# Patient Record
Sex: Female | Born: 1935 | Race: White | Hispanic: No | Smoking: Never smoker
Health system: Southern US, Community
[De-identification: ages and names within clinical notes are randomized; demographics above are authoritative.]

## PROBLEM LIST (undated history)

## (undated) DIAGNOSIS — J45909 Unspecified asthma, uncomplicated: Secondary | ICD-10-CM

## (undated) DIAGNOSIS — H269 Unspecified cataract: Secondary | ICD-10-CM

## (undated) DIAGNOSIS — R002 Palpitations: Secondary | ICD-10-CM

## (undated) DIAGNOSIS — I10 Essential (primary) hypertension: Secondary | ICD-10-CM

## (undated) DIAGNOSIS — E785 Hyperlipidemia, unspecified: Secondary | ICD-10-CM

## (undated) DIAGNOSIS — F419 Anxiety disorder, unspecified: Secondary | ICD-10-CM

## (undated) DIAGNOSIS — M1990 Unspecified osteoarthritis, unspecified site: Secondary | ICD-10-CM

## (undated) DIAGNOSIS — K219 Gastro-esophageal reflux disease without esophagitis: Secondary | ICD-10-CM

## (undated) DIAGNOSIS — T7840XA Allergy, unspecified, initial encounter: Secondary | ICD-10-CM

## (undated) DIAGNOSIS — C4491 Basal cell carcinoma of skin, unspecified: Secondary | ICD-10-CM

## (undated) DIAGNOSIS — E079 Disorder of thyroid, unspecified: Secondary | ICD-10-CM

## (undated) DIAGNOSIS — G43909 Migraine, unspecified, not intractable, without status migrainosus: Secondary | ICD-10-CM

## (undated) HISTORY — PX: CATARACT EXTRACTION: SUR2

## (undated) HISTORY — PX: PARTIAL KNEE ARTHROPLASTY: SHX2174

## (undated) HISTORY — DX: Allergy, unspecified, initial encounter: T78.40XA

## (undated) HISTORY — PX: MENISCUS REPAIR: SHX5179

## (undated) HISTORY — PX: COLONOSCOPY: SHX174

## (undated) HISTORY — DX: Essential (primary) hypertension: I10

## (undated) HISTORY — DX: Basal cell carcinoma of skin, unspecified: C44.91

## (undated) HISTORY — DX: Unspecified osteoarthritis, unspecified site: M19.90

## (undated) HISTORY — PX: APPENDECTOMY: SHX54

## (undated) HISTORY — DX: Unspecified cataract: H26.9

## (undated) HISTORY — PX: ABDOMINAL HYSTERECTOMY: SHX81

## (undated) HISTORY — DX: Migraine, unspecified, not intractable, without status migrainosus: G43.909

## (undated) HISTORY — DX: Unspecified asthma, uncomplicated: J45.909

## (undated) HISTORY — PX: TONSILLECTOMY: SUR1361

## (undated) HISTORY — DX: Anxiety disorder, unspecified: F41.9

## (undated) HISTORY — DX: Disorder of thyroid, unspecified: E07.9

## (undated) HISTORY — DX: Palpitations: R00.2

## (undated) HISTORY — DX: Gastro-esophageal reflux disease without esophagitis: K21.9

## (undated) HISTORY — DX: Hyperlipidemia, unspecified: E78.5

---

## 1999-06-27 ENCOUNTER — Other Ambulatory Visit: Admission: RE | Admit: 1999-06-27 | Discharge: 1999-06-27 | Payer: Self-pay | Admitting: Family Medicine

## 2002-12-08 ENCOUNTER — Other Ambulatory Visit: Admission: RE | Admit: 2002-12-08 | Discharge: 2002-12-08 | Payer: Self-pay | Admitting: Family Medicine

## 2005-06-19 ENCOUNTER — Other Ambulatory Visit: Admission: RE | Admit: 2005-06-19 | Discharge: 2005-06-19 | Payer: Self-pay | Admitting: Family Medicine

## 2009-06-07 ENCOUNTER — Emergency Department (HOSPITAL_COMMUNITY): Admission: EM | Admit: 2009-06-07 | Discharge: 2009-06-07 | Payer: Self-pay | Admitting: Emergency Medicine

## 2011-04-09 LAB — POCT I-STAT, CHEM 8
BUN: 13 mg/dL (ref 6–23)
Creatinine, Ser: 0.8 mg/dL (ref 0.4–1.2)
Glucose, Bld: 118 mg/dL — ABNORMAL HIGH (ref 70–99)
Hemoglobin: 13.9 g/dL (ref 12.0–15.0)
Sodium: 139 mEq/L (ref 135–145)
TCO2: 22 mmol/L (ref 0–100)

## 2011-04-09 LAB — POCT CARDIAC MARKERS
CKMB, poc: <1 ng/mL — ABNORMAL LOW (ref 1.0–8.0)
Myoglobin, poc: 108 ng/mL (ref 12–200)

## 2012-01-21 DIAGNOSIS — E785 Hyperlipidemia, unspecified: Secondary | ICD-10-CM | POA: Diagnosis not present

## 2012-01-21 DIAGNOSIS — I10 Essential (primary) hypertension: Secondary | ICD-10-CM | POA: Diagnosis not present

## 2012-01-21 DIAGNOSIS — E039 Hypothyroidism, unspecified: Secondary | ICD-10-CM | POA: Diagnosis not present

## 2012-01-23 DIAGNOSIS — E875 Hyperkalemia: Secondary | ICD-10-CM | POA: Diagnosis not present

## 2012-01-23 DIAGNOSIS — R7989 Other specified abnormal findings of blood chemistry: Secondary | ICD-10-CM | POA: Diagnosis not present

## 2012-02-26 DIAGNOSIS — J019 Acute sinusitis, unspecified: Secondary | ICD-10-CM | POA: Diagnosis not present

## 2012-02-26 DIAGNOSIS — Z1231 Encounter for screening mammogram for malignant neoplasm of breast: Secondary | ICD-10-CM | POA: Diagnosis not present

## 2012-02-28 DIAGNOSIS — M1710 Unilateral primary osteoarthritis, unspecified knee: Secondary | ICD-10-CM | POA: Diagnosis not present

## 2012-04-03 DIAGNOSIS — J209 Acute bronchitis, unspecified: Secondary | ICD-10-CM | POA: Diagnosis not present

## 2012-04-17 DIAGNOSIS — D235 Other benign neoplasm of skin of trunk: Secondary | ICD-10-CM | POA: Diagnosis not present

## 2012-04-17 DIAGNOSIS — Z85828 Personal history of other malignant neoplasm of skin: Secondary | ICD-10-CM | POA: Diagnosis not present

## 2012-04-17 DIAGNOSIS — L820 Inflamed seborrheic keratosis: Secondary | ICD-10-CM | POA: Diagnosis not present

## 2012-04-17 DIAGNOSIS — L570 Actinic keratosis: Secondary | ICD-10-CM | POA: Diagnosis not present

## 2012-05-05 DIAGNOSIS — M1710 Unilateral primary osteoarthritis, unspecified knee: Secondary | ICD-10-CM | POA: Diagnosis not present

## 2012-05-05 DIAGNOSIS — IMO0002 Reserved for concepts with insufficient information to code with codable children: Secondary | ICD-10-CM | POA: Diagnosis not present

## 2012-05-05 DIAGNOSIS — M169 Osteoarthritis of hip, unspecified: Secondary | ICD-10-CM | POA: Diagnosis not present

## 2012-05-05 DIAGNOSIS — M7120 Synovial cyst of popliteal space [Baker], unspecified knee: Secondary | ICD-10-CM | POA: Diagnosis not present

## 2012-05-21 DIAGNOSIS — K219 Gastro-esophageal reflux disease without esophagitis: Secondary | ICD-10-CM | POA: Diagnosis not present

## 2012-05-21 DIAGNOSIS — G8918 Other acute postprocedural pain: Secondary | ICD-10-CM | POA: Diagnosis not present

## 2012-05-21 DIAGNOSIS — M1710 Unilateral primary osteoarthritis, unspecified knee: Secondary | ICD-10-CM | POA: Diagnosis not present

## 2012-05-22 DIAGNOSIS — K219 Gastro-esophageal reflux disease without esophagitis: Secondary | ICD-10-CM | POA: Diagnosis not present

## 2012-05-22 DIAGNOSIS — M1710 Unilateral primary osteoarthritis, unspecified knee: Secondary | ICD-10-CM | POA: Diagnosis not present

## 2012-05-22 DIAGNOSIS — G8918 Other acute postprocedural pain: Secondary | ICD-10-CM | POA: Diagnosis not present

## 2012-05-23 DIAGNOSIS — Z96659 Presence of unspecified artificial knee joint: Secondary | ICD-10-CM | POA: Diagnosis not present

## 2012-05-23 DIAGNOSIS — M1710 Unilateral primary osteoarthritis, unspecified knee: Secondary | ICD-10-CM | POA: Diagnosis not present

## 2012-05-23 DIAGNOSIS — M25569 Pain in unspecified knee: Secondary | ICD-10-CM | POA: Diagnosis not present

## 2012-05-23 DIAGNOSIS — IMO0001 Reserved for inherently not codable concepts without codable children: Secondary | ICD-10-CM | POA: Diagnosis not present

## 2012-05-28 ENCOUNTER — Ambulatory Visit: Payer: Self-pay | Admitting: Physical Therapy

## 2012-05-28 DIAGNOSIS — M1710 Unilateral primary osteoarthritis, unspecified knee: Secondary | ICD-10-CM | POA: Diagnosis not present

## 2012-05-28 DIAGNOSIS — Z96659 Presence of unspecified artificial knee joint: Secondary | ICD-10-CM | POA: Diagnosis not present

## 2012-05-28 DIAGNOSIS — IMO0001 Reserved for inherently not codable concepts without codable children: Secondary | ICD-10-CM | POA: Diagnosis not present

## 2012-05-28 DIAGNOSIS — M25569 Pain in unspecified knee: Secondary | ICD-10-CM | POA: Diagnosis not present

## 2012-05-30 DIAGNOSIS — Z96659 Presence of unspecified artificial knee joint: Secondary | ICD-10-CM | POA: Diagnosis not present

## 2012-05-30 DIAGNOSIS — M1710 Unilateral primary osteoarthritis, unspecified knee: Secondary | ICD-10-CM | POA: Diagnosis not present

## 2012-05-30 DIAGNOSIS — M25569 Pain in unspecified knee: Secondary | ICD-10-CM | POA: Diagnosis not present

## 2012-05-30 DIAGNOSIS — IMO0001 Reserved for inherently not codable concepts without codable children: Secondary | ICD-10-CM | POA: Diagnosis not present

## 2012-06-02 DIAGNOSIS — Z96659 Presence of unspecified artificial knee joint: Secondary | ICD-10-CM | POA: Diagnosis not present

## 2012-06-02 DIAGNOSIS — M1710 Unilateral primary osteoarthritis, unspecified knee: Secondary | ICD-10-CM | POA: Diagnosis not present

## 2012-06-02 DIAGNOSIS — IMO0001 Reserved for inherently not codable concepts without codable children: Secondary | ICD-10-CM | POA: Diagnosis not present

## 2012-06-02 DIAGNOSIS — M25569 Pain in unspecified knee: Secondary | ICD-10-CM | POA: Diagnosis not present

## 2012-06-05 DIAGNOSIS — Z96659 Presence of unspecified artificial knee joint: Secondary | ICD-10-CM | POA: Diagnosis not present

## 2012-06-05 DIAGNOSIS — M1710 Unilateral primary osteoarthritis, unspecified knee: Secondary | ICD-10-CM | POA: Diagnosis not present

## 2012-06-05 DIAGNOSIS — M25569 Pain in unspecified knee: Secondary | ICD-10-CM | POA: Diagnosis not present

## 2012-06-05 DIAGNOSIS — IMO0001 Reserved for inherently not codable concepts without codable children: Secondary | ICD-10-CM | POA: Diagnosis not present

## 2012-06-09 DIAGNOSIS — M25569 Pain in unspecified knee: Secondary | ICD-10-CM | POA: Diagnosis not present

## 2012-06-09 DIAGNOSIS — Z96659 Presence of unspecified artificial knee joint: Secondary | ICD-10-CM | POA: Diagnosis not present

## 2012-06-09 DIAGNOSIS — M1710 Unilateral primary osteoarthritis, unspecified knee: Secondary | ICD-10-CM | POA: Diagnosis not present

## 2012-06-09 DIAGNOSIS — IMO0001 Reserved for inherently not codable concepts without codable children: Secondary | ICD-10-CM | POA: Diagnosis not present

## 2012-06-12 DIAGNOSIS — M1710 Unilateral primary osteoarthritis, unspecified knee: Secondary | ICD-10-CM | POA: Diagnosis not present

## 2012-06-12 DIAGNOSIS — IMO0001 Reserved for inherently not codable concepts without codable children: Secondary | ICD-10-CM | POA: Diagnosis not present

## 2012-06-12 DIAGNOSIS — Z96659 Presence of unspecified artificial knee joint: Secondary | ICD-10-CM | POA: Diagnosis not present

## 2012-06-12 DIAGNOSIS — M25569 Pain in unspecified knee: Secondary | ICD-10-CM | POA: Diagnosis not present

## 2012-06-16 DIAGNOSIS — H526 Other disorders of refraction: Secondary | ICD-10-CM | POA: Diagnosis not present

## 2012-06-16 DIAGNOSIS — H02839 Dermatochalasis of unspecified eye, unspecified eyelid: Secondary | ICD-10-CM | POA: Diagnosis not present

## 2012-06-16 DIAGNOSIS — H3530 Unspecified macular degeneration: Secondary | ICD-10-CM | POA: Diagnosis not present

## 2012-06-16 DIAGNOSIS — H26499 Other secondary cataract, unspecified eye: Secondary | ICD-10-CM | POA: Diagnosis not present

## 2012-06-17 DIAGNOSIS — Z96659 Presence of unspecified artificial knee joint: Secondary | ICD-10-CM | POA: Diagnosis not present

## 2012-06-17 DIAGNOSIS — M1710 Unilateral primary osteoarthritis, unspecified knee: Secondary | ICD-10-CM | POA: Diagnosis not present

## 2012-06-17 DIAGNOSIS — IMO0001 Reserved for inherently not codable concepts without codable children: Secondary | ICD-10-CM | POA: Diagnosis not present

## 2012-06-17 DIAGNOSIS — M25569 Pain in unspecified knee: Secondary | ICD-10-CM | POA: Diagnosis not present

## 2012-06-19 DIAGNOSIS — M25569 Pain in unspecified knee: Secondary | ICD-10-CM | POA: Diagnosis not present

## 2012-06-19 DIAGNOSIS — IMO0001 Reserved for inherently not codable concepts without codable children: Secondary | ICD-10-CM | POA: Diagnosis not present

## 2012-06-19 DIAGNOSIS — Z96659 Presence of unspecified artificial knee joint: Secondary | ICD-10-CM | POA: Diagnosis not present

## 2012-06-19 DIAGNOSIS — M1710 Unilateral primary osteoarthritis, unspecified knee: Secondary | ICD-10-CM | POA: Diagnosis not present

## 2012-06-26 DIAGNOSIS — Z7982 Long term (current) use of aspirin: Secondary | ICD-10-CM | POA: Diagnosis not present

## 2012-06-26 DIAGNOSIS — E7800 Pure hypercholesterolemia, unspecified: Secondary | ICD-10-CM | POA: Diagnosis not present

## 2012-06-26 DIAGNOSIS — R51 Headache: Secondary | ICD-10-CM | POA: Diagnosis not present

## 2012-06-26 DIAGNOSIS — R1110 Vomiting, unspecified: Secondary | ICD-10-CM | POA: Diagnosis not present

## 2012-06-26 DIAGNOSIS — G43909 Migraine, unspecified, not intractable, without status migrainosus: Secondary | ICD-10-CM | POA: Diagnosis not present

## 2012-06-26 DIAGNOSIS — R6889 Other general symptoms and signs: Secondary | ICD-10-CM | POA: Diagnosis not present

## 2012-06-26 DIAGNOSIS — E869 Volume depletion, unspecified: Secondary | ICD-10-CM | POA: Diagnosis not present

## 2012-06-26 DIAGNOSIS — Z79899 Other long term (current) drug therapy: Secondary | ICD-10-CM | POA: Diagnosis not present

## 2012-06-26 DIAGNOSIS — R112 Nausea with vomiting, unspecified: Secondary | ICD-10-CM | POA: Diagnosis not present

## 2012-07-07 DIAGNOSIS — Z96659 Presence of unspecified artificial knee joint: Secondary | ICD-10-CM | POA: Diagnosis not present

## 2012-07-10 DIAGNOSIS — R5381 Other malaise: Secondary | ICD-10-CM | POA: Diagnosis not present

## 2012-07-10 DIAGNOSIS — N390 Urinary tract infection, site not specified: Secondary | ICD-10-CM | POA: Diagnosis not present

## 2012-07-10 DIAGNOSIS — Z79899 Other long term (current) drug therapy: Secondary | ICD-10-CM | POA: Diagnosis not present

## 2012-07-10 DIAGNOSIS — E785 Hyperlipidemia, unspecified: Secondary | ICD-10-CM | POA: Diagnosis not present

## 2012-07-10 DIAGNOSIS — J40 Bronchitis, not specified as acute or chronic: Secondary | ICD-10-CM | POA: Diagnosis not present

## 2012-07-10 DIAGNOSIS — D510 Vitamin B12 deficiency anemia due to intrinsic factor deficiency: Secondary | ICD-10-CM | POA: Diagnosis not present

## 2012-07-10 DIAGNOSIS — E559 Vitamin D deficiency, unspecified: Secondary | ICD-10-CM | POA: Diagnosis not present

## 2012-08-13 DIAGNOSIS — M79609 Pain in unspecified limb: Secondary | ICD-10-CM | POA: Diagnosis not present

## 2012-08-13 DIAGNOSIS — M722 Plantar fascial fibromatosis: Secondary | ICD-10-CM | POA: Diagnosis not present

## 2012-08-18 DIAGNOSIS — M722 Plantar fascial fibromatosis: Secondary | ICD-10-CM | POA: Diagnosis not present

## 2012-08-18 DIAGNOSIS — M79609 Pain in unspecified limb: Secondary | ICD-10-CM | POA: Diagnosis not present

## 2012-09-08 DIAGNOSIS — M79609 Pain in unspecified limb: Secondary | ICD-10-CM | POA: Diagnosis not present

## 2012-09-08 DIAGNOSIS — M722 Plantar fascial fibromatosis: Secondary | ICD-10-CM | POA: Diagnosis not present

## 2012-09-12 DIAGNOSIS — J069 Acute upper respiratory infection, unspecified: Secondary | ICD-10-CM | POA: Diagnosis not present

## 2012-09-29 DIAGNOSIS — M79609 Pain in unspecified limb: Secondary | ICD-10-CM | POA: Diagnosis not present

## 2012-09-29 DIAGNOSIS — M722 Plantar fascial fibromatosis: Secondary | ICD-10-CM | POA: Diagnosis not present

## 2012-10-20 DIAGNOSIS — M79609 Pain in unspecified limb: Secondary | ICD-10-CM | POA: Diagnosis not present

## 2012-10-20 DIAGNOSIS — M722 Plantar fascial fibromatosis: Secondary | ICD-10-CM | POA: Diagnosis not present

## 2012-10-21 DIAGNOSIS — I10 Essential (primary) hypertension: Secondary | ICD-10-CM | POA: Diagnosis not present

## 2012-10-21 DIAGNOSIS — E785 Hyperlipidemia, unspecified: Secondary | ICD-10-CM | POA: Diagnosis not present

## 2012-10-21 DIAGNOSIS — Z23 Encounter for immunization: Secondary | ICD-10-CM | POA: Diagnosis not present

## 2012-10-23 DIAGNOSIS — IMO0002 Reserved for concepts with insufficient information to code with codable children: Secondary | ICD-10-CM | POA: Diagnosis not present

## 2012-10-23 DIAGNOSIS — M25569 Pain in unspecified knee: Secondary | ICD-10-CM | POA: Diagnosis not present

## 2012-10-23 DIAGNOSIS — M899 Disorder of bone, unspecified: Secondary | ICD-10-CM | POA: Diagnosis not present

## 2012-10-23 DIAGNOSIS — Z9660 Presence of unspecified orthopedic joint implant: Secondary | ICD-10-CM | POA: Diagnosis not present

## 2012-12-19 DIAGNOSIS — J40 Bronchitis, not specified as acute or chronic: Secondary | ICD-10-CM | POA: Diagnosis not present

## 2012-12-19 DIAGNOSIS — H65199 Other acute nonsuppurative otitis media, unspecified ear: Secondary | ICD-10-CM | POA: Diagnosis not present

## 2012-12-19 DIAGNOSIS — R070 Pain in throat: Secondary | ICD-10-CM | POA: Diagnosis not present

## 2013-01-14 DIAGNOSIS — Z111 Encounter for screening for respiratory tuberculosis: Secondary | ICD-10-CM | POA: Diagnosis not present

## 2013-03-19 DIAGNOSIS — C44320 Squamous cell carcinoma of skin of unspecified parts of face: Secondary | ICD-10-CM | POA: Diagnosis not present

## 2013-03-19 DIAGNOSIS — L988 Other specified disorders of the skin and subcutaneous tissue: Secondary | ICD-10-CM | POA: Diagnosis not present

## 2013-03-19 DIAGNOSIS — L570 Actinic keratosis: Secondary | ICD-10-CM | POA: Diagnosis not present

## 2013-03-19 DIAGNOSIS — L820 Inflamed seborrheic keratosis: Secondary | ICD-10-CM | POA: Diagnosis not present

## 2013-03-25 DIAGNOSIS — Z1231 Encounter for screening mammogram for malignant neoplasm of breast: Secondary | ICD-10-CM | POA: Diagnosis not present

## 2013-03-31 ENCOUNTER — Encounter: Payer: Self-pay | Admitting: Nurse Practitioner

## 2013-04-23 DIAGNOSIS — M25569 Pain in unspecified knee: Secondary | ICD-10-CM | POA: Diagnosis not present

## 2013-04-23 DIAGNOSIS — Z471 Aftercare following joint replacement surgery: Secondary | ICD-10-CM | POA: Diagnosis not present

## 2013-04-23 DIAGNOSIS — Z96659 Presence of unspecified artificial knee joint: Secondary | ICD-10-CM | POA: Diagnosis not present

## 2013-04-23 DIAGNOSIS — K219 Gastro-esophageal reflux disease without esophagitis: Secondary | ICD-10-CM | POA: Diagnosis not present

## 2013-04-23 DIAGNOSIS — E785 Hyperlipidemia, unspecified: Secondary | ICD-10-CM | POA: Diagnosis not present

## 2013-04-30 DIAGNOSIS — Z85828 Personal history of other malignant neoplasm of skin: Secondary | ICD-10-CM | POA: Diagnosis not present

## 2013-04-30 DIAGNOSIS — L820 Inflamed seborrheic keratosis: Secondary | ICD-10-CM | POA: Diagnosis not present

## 2013-05-04 ENCOUNTER — Other Ambulatory Visit: Payer: Self-pay

## 2013-05-04 MED ORDER — ATORVASTATIN CALCIUM 20 MG PO TABS: 20.0000 mg | ORAL_TABLET | Freq: Every day | ORAL | Status: DC

## 2013-05-04 NOTE — Telephone Encounter (Signed)
Last lipids 10/13  Last seen 12/13

## 2013-05-07 ENCOUNTER — Other Ambulatory Visit: Payer: Self-pay

## 2013-05-07 MED ORDER — ATORVASTATIN CALCIUM 20 MG PO TABS: ORAL_TABLET | ORAL | Status: DC

## 2013-05-21 ENCOUNTER — Other Ambulatory Visit: Payer: Self-pay | Admitting: Family Medicine

## 2013-05-26 DIAGNOSIS — M79609 Pain in unspecified limb: Secondary | ICD-10-CM | POA: Diagnosis not present

## 2013-05-26 DIAGNOSIS — M722 Plantar fascial fibromatosis: Secondary | ICD-10-CM | POA: Diagnosis not present

## 2013-06-08 ENCOUNTER — Ambulatory Visit (INDEPENDENT_AMBULATORY_CARE_PROVIDER_SITE_OTHER): Payer: Medicare Other | Admitting: Nurse Practitioner

## 2013-06-08 ENCOUNTER — Encounter: Payer: Self-pay | Admitting: Nurse Practitioner

## 2013-06-08 ENCOUNTER — Other Ambulatory Visit: Payer: Medicare Other

## 2013-06-08 VITALS — BP 118/65 | HR 90 | Temp 97.3°F | Ht 75.0 in | Wt 152.0 lb

## 2013-06-08 DIAGNOSIS — E785 Hyperlipidemia, unspecified: Secondary | ICD-10-CM

## 2013-06-08 LAB — COMPLETE METABOLIC PANEL WITH GFR
AST: 17 U/L (ref 0–37)
Albumin: 3.8 g/dL (ref 3.5–5.2)
Alkaline Phosphatase: 60 U/L (ref 39–117)
Calcium: 9.6 mg/dL (ref 8.4–10.5)
Chloride: 108 mEq/L (ref 96–112)
Glucose, Bld: 89 mg/dL (ref 70–99)
Potassium: 5.4 mEq/L — ABNORMAL HIGH (ref 3.5–5.3)
Sodium: 142 mEq/L (ref 135–145)
Total Protein: 6.5 g/dL (ref 6.0–8.3)

## 2013-06-08 MED ORDER — ATORVASTATIN CALCIUM 20 MG PO TABS: 20.0000 mg | ORAL_TABLET | Freq: Every day | ORAL | Status: DC

## 2013-06-08 NOTE — Progress Notes (Signed)
  Subjective:    Patient ID: Megan Chapman, female    DOB: 10-May-1931, 79 y.o.   MRN: [id number]  Hyperlipidemia This is a chronic problem. The current episode started more than 1 year ago. The problem is controlled. Recent lipid tests were reviewed and are normal. There are no known factors aggravating her hyperlipidemia. Pertinent negatives include no focal sensory loss, focal weakness, leg pain, myalgias or shortness of breath. Current antihyperlipidemic treatment includes statins. The current treatment provides significant improvement of lipids. There are no compliance problems.  Risk factors for coronary artery disease include post-menopausal.      Review of Systems  Respiratory: Negative for shortness of breath.   Musculoskeletal: Negative for myalgias.  Neurological: Negative for focal weakness.  All other systems reviewed and are negative.       Objective:   Physical Exam  Constitutional: She is oriented to person, place, and time. She appears well-developed and well-nourished.  HENT:  Nose: Nose normal.  Mouth/Throat: Oropharynx is clear and moist.  Eyes: EOM are normal.  Neck: Trachea normal, normal range of motion and full passive range of motion without pain. Neck supple. No JVD present. Carotid bruit is not present. No thyromegaly present.  Cardiovascular: Normal rate, regular rhythm, normal heart sounds and intact distal pulses.  Exam reveals no gallop and no friction rub.   No murmur heard. Pulmonary/Chest: Effort normal and breath sounds normal.  Abdominal: Soft. Bowel sounds are normal. She exhibits no distension and no mass. There is no tenderness.  Musculoskeletal: Normal range of motion.  Lymphadenopathy:    She has no cervical adenopathy.  Neurological: She is alert and oriented to person, place, and time. She has normal reflexes.  Skin: Skin is warm and dry.  Psychiatric: She has a normal mood and affect. Her behavior is normal. Judgment and thought content  normal.    BP 118/65  Pulse 90  Temp(Src) 97.3 F (36.3 C) (Oral)  Ht 6\' 3"  (1.905 m)  Wt 152 lb (68.947 kg)  BMI 19 kg/m2       Assessment & Plan:  1. Hyperlipidemia *Low fat diet an dexercise - COMPLETE METABOLIC PANEL WITH GFR - NMR Lipoprofile with Lipids - atorvastatin (LIPITOR) 20 MG tablet; Take 1 tablet (20 mg total) by mouth daily. 1 1/2 PO qd  Dispense: 45 tablet; Refill: 5  Mary-Margaret Daphine Deutscher, FNP

## 2013-06-08 NOTE — Patient Instructions (Addendum)

## 2013-06-09 ENCOUNTER — Other Ambulatory Visit: Payer: Self-pay | Admitting: Nurse Practitioner

## 2013-06-09 LAB — NMR LIPOPROFILE WITH LIPIDS
Cholesterol, Total: 196 mg/dL (ref <200)
HDL Particle Number: 39.9 umol/L (ref >=30.5)
Large HDL-P: 7 umol/L (ref >=4.8)
Large VLDL-P: 6.5 nmol/L — ABNORMAL HIGH (ref <=2.7)
Small LDL Particle Number: 1087 nmol/L — ABNORMAL HIGH (ref <=527)
Triglycerides: 207 mg/dL — ABNORMAL HIGH (ref <150)

## 2013-06-09 MED ORDER — ATORVASTATIN CALCIUM 40 MG PO TABS: 40.0000 mg | ORAL_TABLET | Freq: Every day | ORAL | Status: DC

## 2013-06-09 NOTE — Progress Notes (Signed)
Patient came in for labs only.

## 2013-06-09 NOTE — Progress Notes (Signed)
Pt aware of results, to increase meds & rx at pharmacy

## 2013-06-12 ENCOUNTER — Telehealth: Payer: Self-pay | Admitting: Nurse Practitioner

## 2013-06-12 NOTE — Telephone Encounter (Signed)
Patient notified of lab results from 06-08-13

## 2013-07-06 DIAGNOSIS — M79609 Pain in unspecified limb: Secondary | ICD-10-CM | POA: Diagnosis not present

## 2013-07-06 DIAGNOSIS — M722 Plantar fascial fibromatosis: Secondary | ICD-10-CM | POA: Diagnosis not present

## 2013-08-03 DIAGNOSIS — H3530 Unspecified macular degeneration: Secondary | ICD-10-CM | POA: Diagnosis not present

## 2013-08-03 DIAGNOSIS — H526 Other disorders of refraction: Secondary | ICD-10-CM | POA: Diagnosis not present

## 2013-08-03 DIAGNOSIS — H26499 Other secondary cataract, unspecified eye: Secondary | ICD-10-CM | POA: Diagnosis not present

## 2013-09-08 ENCOUNTER — Ambulatory Visit (INDEPENDENT_AMBULATORY_CARE_PROVIDER_SITE_OTHER): Payer: Medicare Other | Admitting: Nurse Practitioner

## 2013-09-08 ENCOUNTER — Encounter: Payer: Self-pay | Admitting: Nurse Practitioner

## 2013-09-08 VITALS — BP 142/74 | HR 75 | Temp 97.0°F | Ht 63.0 in | Wt 153.0 lb

## 2013-09-08 DIAGNOSIS — Z124 Encounter for screening for malignant neoplasm of cervix: Secondary | ICD-10-CM

## 2013-09-08 DIAGNOSIS — Z13820 Encounter for screening for osteoporosis: Secondary | ICD-10-CM

## 2013-09-08 DIAGNOSIS — Z01419 Encounter for gynecological examination (general) (routine) without abnormal findings: Secondary | ICD-10-CM

## 2013-09-08 DIAGNOSIS — R5381 Other malaise: Secondary | ICD-10-CM | POA: Diagnosis not present

## 2013-09-08 DIAGNOSIS — Z0000 Encounter for general adult medical examination without abnormal findings: Secondary | ICD-10-CM

## 2013-09-08 DIAGNOSIS — E785 Hyperlipidemia, unspecified: Secondary | ICD-10-CM

## 2013-09-08 DIAGNOSIS — R5383 Other fatigue: Secondary | ICD-10-CM

## 2013-09-08 DIAGNOSIS — N390 Urinary tract infection, site not specified: Secondary | ICD-10-CM

## 2013-09-08 LAB — POCT URINALYSIS DIPSTICK
Bilirubin, UA: NEGATIVE
Glucose, UA: NEGATIVE
Ketones, UA: NEGATIVE
Leukocytes, UA: NEGATIVE
Nitrite, UA: NEGATIVE
Spec Grav, UA: 1.02
Urobilinogen, UA: NEGATIVE

## 2013-09-08 LAB — POCT CBC
Hemoglobin: 12.8 g/dL (ref 12.2–16.2)
Lymph, poc: 1.6 (ref 0.6–3.4)
MCH, POC: 29.5 pg (ref 27–31.2)
MCHC: 33.8 g/dL (ref 31.8–35.4)
MCV: 87.3 fL (ref 80–97)
MPV: 8.3 fL (ref 0–99.8)
POC LYMPH PERCENT: 32.7 %L (ref 10–50)
Platelet Count, POC: 237 10*3/uL (ref 142–424)

## 2013-09-08 LAB — POCT UA - MICROSCOPIC ONLY
Casts, Ur, LPF, POC: NEGATIVE
Crystals, Ur, HPF, POC: NEGATIVE
Yeast, UA: NEGATIVE

## 2013-09-08 MED ORDER — ATORVASTATIN CALCIUM 40 MG PO TABS: 40.0000 mg | ORAL_TABLET | Freq: Every day | ORAL | Status: DC

## 2013-09-08 MED ORDER — ALPRAZOLAM 0.5 MG PO TBDP: 0.5000 mg | ORAL_TABLET | Freq: Two times a day (BID) | ORAL | Status: DC | PRN

## 2013-09-08 NOTE — Patient Instructions (Signed)

## 2013-09-08 NOTE — Progress Notes (Signed)
  Subjective:    Patient ID: Megan Chapman, female    DOB: 12/09/1931, 79 y.o.   MRN: [id number]  HPI  Patient ID: Megan Chapman, female    DOB: 03/11/1931, 79 y.o.   MRN: [id number]  - Patient here today for annual physical exam. Hyperlipidemia This is a chronic problem. The current episode started more than 1 year ago. The problem is controlled. Recent lipid tests were reviewed and are normal. There are no known factors aggravating her hyperlipidemia. Pertinent negatives include no focal sensory loss, focal weakness, leg pain, myalgias or shortness of breath. Current antihyperlipidemic treatment includes statins. The current treatment provides significant improvement of lipids. There are no compliance problems.  Risk factors for coronary artery disease include post-menopausal.    Review of Systems  Constitutional: Negative.   HENT: Negative.   Eyes: Negative.   Respiratory: Negative.   Cardiovascular: Negative.   Gastrointestinal: Negative.   Endocrine: Negative.   Genitourinary: Negative.   Musculoskeletal: Negative.   Neurological: Negative.   Hematological: Negative.   Psychiatric/Behavioral: Negative.        Objective:   Physical Exam  Constitutional: She is oriented to person, place, and time. She appears well-developed and well-nourished.  HENT:  Head: Normocephalic.  Right Ear: Hearing, tympanic membrane, external ear and ear canal normal.  Left Ear: Hearing, tympanic membrane, external ear and ear canal normal.  Nose: Nose normal.  Mouth/Throat: Uvula is midline and oropharynx is clear and moist.  Eyes: Conjunctivae and EOM are normal. Pupils are equal, round, and reactive to light.  Neck: Normal range of motion and full passive range of motion without pain. Neck supple. No JVD present. Carotid bruit is not present. No mass and no thyromegaly present.  Cardiovascular: Normal rate, normal heart sounds and intact distal pulses.   No murmur heard. Multiple lower ext  varicosities  Pulmonary/Chest: Effort normal and breath sounds normal. Right breast exhibits no inverted nipple, no mass, no nipple discharge, no skin change and no tenderness. Left breast exhibits no inverted nipple, no mass, no nipple discharge, no skin change and no tenderness.  Abdominal: Soft. Bowel sounds are normal. She exhibits no mass. There is no tenderness.  Genitourinary: Vagina normal and uterus normal. No breast swelling, tenderness, discharge or bleeding.  bimanual exam-No adnexal masses or tenderness.  Vaginal cuff in tact  Musculoskeletal: Normal range of motion.  Lymphadenopathy:    She has no cervical adenopathy.  Neurological: She is alert and oriented to person, place, and time.  Skin: Skin is warm and dry.  Moles in varying sizes on neck and anterior trunk  Psychiatric: She has a normal mood and affect. Her behavior is normal. Judgment and thought content normal.   BP 142/74  Pulse 75  Temp(Src) 97 F (36.1 C) (Oral)  Ht 5\' 3"  (1.6 m)  Wt 153 lb (69.4 kg)  BMI 27.11 kg/m2        Assessment & Plan:  1. Annual physical exam Labs pending - POCT urinalysis dipstick - POCT UA - Microscopic Only - CMP14+EGFR - Thyroid Panel With TSH  2. Hyperlipidemia *Low fat diet and exercise - NMR, lipoprofile - atorvastatin (LIPITOR) 40 MG tablet; Take 1 tablet (40 mg total) by mouth daily.  Dispense: 30 tablet; Refill: 5  3. Fatigue rest - POCT CBC  4. Osteoporosis screening Waiver signed - DG Bone Density; Future  5. Encounter for routine gynecological examination  - Pap IG (Image Guided)  Mary-Margaret Daphine Deutscher, FNP

## 2013-09-09 ENCOUNTER — Ambulatory Visit (INDEPENDENT_AMBULATORY_CARE_PROVIDER_SITE_OTHER): Payer: Medicare Other

## 2013-09-09 ENCOUNTER — Ambulatory Visit (INDEPENDENT_AMBULATORY_CARE_PROVIDER_SITE_OTHER): Payer: Medicare Other | Admitting: Pharmacist

## 2013-09-09 VITALS — Ht 63.0 in | Wt 153.5 lb

## 2013-09-09 DIAGNOSIS — Z13820 Encounter for screening for osteoporosis: Secondary | ICD-10-CM

## 2013-09-09 NOTE — Progress Notes (Signed)
Patient ID: Megan Chapman, female   DOB: 07/25/1931, 79 y.o.   MRN: [id number]  [facility] Current Height: Height: 5\' 3"  (160 cm)      Max Lifetime Height:  5\' 5"  Current Weight: Weight: 153 lb 8 oz (69.627 kg)       Ethnicity:Caucasian   HPI: Does pt already have a diagnosis of:  Osteopenia?  No Osteoporosis?  No  Back Pain?  Yes       Kyphosis?  No Prior fracture?  No                                                              PMH: Age at menopause:  yes - around 79yo (partial hyst.) Hysterectomy?  Yes Oophorectomy?  No - left 1 ovary HRT? Former - premarin about 2 years Steroid Use?  No Thyroid med?  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  No Current or previous eating disorders?  No Last Vitamin D Result:  Greater than 6 months ago Last GFR Result:  79 (06/08/2013)   FH/SH: Family history of osteoporosis?  No Parent with history of hip fracture?  No Family history of breast cancer?  No Exercise?  No Smoking?  No Alcohol?  No    Calcium Assessment Calcium Intake  # of servings/day  Calcium mg  Milk (8 oz) 1  x  300  = 300mg   Yogurt (4 oz) 0 x  200 = 0  Cheese (1 oz) 0 x  200 = 0  Other Calcium sources   250mg   Ca supplement 500MG  = 500MG    Estimated calcium intake per day 1050MG     DEXA Results Date of Test T-Score for AP Spine L1-L4 T-Score for Total Left Hip T-Score for Total Right Hip  09/09/2013 0.0 0.2 1.1  11/15/2010 -0.1 0.6 1.1  07/21/2007 0.0 0.5 1.2  07/16/2005 -0.1 0.8 --   Assessment: Normal BMD - stable  Recommendations: 1.  Discussed BMD results and fall risks 2.  recommend calcium 1200mg  daily through supplementation or diet.  3.  recommend weight bearing exercise - 30 minutes at least 4 days  per week.   4.  Counseled and educated about fall risk and prevention.  Recheck DEXA:  3 years  Time spent counseling patient:  15 minutes  Henrene Pastor, PharmD, CPP

## 2013-09-09 NOTE — Patient Instructions (Signed)

## 2013-09-10 ENCOUNTER — Telehealth: Payer: Self-pay | Admitting: Nurse Practitioner

## 2013-09-10 LAB — CMP14+EGFR
ALT: 17 IU/L (ref 0–32)
AST: 14 IU/L (ref 0–40)
Albumin/Globulin Ratio: 1.7 (ref 1.1–2.5)
Alkaline Phosphatase: 63 IU/L (ref 39–117)
BUN/Creatinine Ratio: 21 (ref 11–26)
Calcium: 9.8 mg/dL (ref 8.6–10.2)
GFR calc non Af Amer: 81 mL/min/{1.73_m2} (ref >59)
Globulin, Total: 2.6 g/dL (ref 1.5–4.5)
Potassium: 5.3 mmol/L — ABNORMAL HIGH (ref 3.5–5.2)
Sodium: 143 mmol/L (ref 134–144)

## 2013-09-10 LAB — NMR, LIPOPROFILE
HDL Cholesterol by NMR: 48 mg/dL (ref >=40)
HDL Particle Number: 33.1 umol/L (ref >=30.5)
LDL Size: 21.1 nm (ref >20.5)
LP-IR Score: 54 — ABNORMAL HIGH (ref <=45)
Small LDL Particle Number: 519 nmol/L (ref <=527)

## 2013-09-10 LAB — THYROID PANEL WITH TSH
T4, Total: 4.7 ug/dL (ref 4.5–12.0)
TSH: 3.82 u[IU]/mL (ref 0.450–4.500)

## 2013-09-10 LAB — URINE CULTURE: Organism ID, Bacteria: NO GROWTH

## 2013-09-10 NOTE — Telephone Encounter (Signed)
Patient aware.

## 2013-09-11 LAB — PAP IG (IMAGE GUIDED): PAP Smear Comment: 0

## 2013-09-14 ENCOUNTER — Ambulatory Visit: Payer: Medicare Other | Admitting: Nurse Practitioner

## 2013-09-22 ENCOUNTER — Telehealth: Payer: Self-pay | Admitting: *Deleted

## 2013-09-22 NOTE — Telephone Encounter (Signed)
Pt aware of results on pap.

## 2013-10-13 ENCOUNTER — Encounter: Payer: Self-pay | Admitting: Family Medicine

## 2013-10-13 ENCOUNTER — Ambulatory Visit (INDEPENDENT_AMBULATORY_CARE_PROVIDER_SITE_OTHER): Payer: Medicare Other | Admitting: Family Medicine

## 2013-10-13 VITALS — BP 138/68 | HR 75 | Temp 98.6°F | Ht 63.0 in | Wt 153.0 lb

## 2013-10-13 DIAGNOSIS — J209 Acute bronchitis, unspecified: Secondary | ICD-10-CM

## 2013-10-13 MED ORDER — AZITHROMYCIN 250 MG PO TABS: ORAL_TABLET | ORAL | Status: DC

## 2013-10-13 MED ORDER — HYDROCODONE-HOMATROPINE 5-1.5 MG/5ML PO SYRP: 5.0000 mL | ORAL_SOLUTION | Freq: Three times a day (TID) | ORAL | Status: DC | PRN

## 2013-10-13 NOTE — Patient Instructions (Signed)

## 2013-10-13 NOTE — Progress Notes (Signed)
  Subjective:    Patient ID: Sheriden Archibeque, female    DOB: [birth year], 79 y.o.   MRN: [id number]  HPI This 79 y.o. female presents for evaluation of cough and congestion and uri sx's For a week.   Review of Systems C/o cough and congestion No chest pain, SOB, HA, dizziness, vision change, N/V, diarrhea, constipation, dysuria, urinary urgency or frequency, myalgias, arthralgias or rash.     Objective:   Physical Exam Vital signs noted  Well developed well nourished female.  HEENT - Head atraumatic Normocephalic                Eyes - PERRLA, Conjuctiva - clear Sclera- Clear EOMI                Ears - EAC's Wnl TM's Wnl Gross Hearing WNL                Nose - Nares patent                 Throat - oropharanx wnl Respiratory - Lungs CTA bilateral Cardiac - RRR S1 and S2 without murmur GI - Abdomen soft Nontender and bowel sounds active x 4 Extremities - No edema. Neuro - Grossly intact.       Assessment & Plan:  Acute bronchitis - Plan: HYDROcodone-homatropine (HYCODAN) 5-1.5 MG/5ML syrup, azithromycin (ZITHROMAX) 250 MG tablet Push po fluids, rest, tylenol and motrin otc as directed.  Deatra Canter FNP

## 2013-10-22 DIAGNOSIS — L820 Inflamed seborrheic keratosis: Secondary | ICD-10-CM | POA: Diagnosis not present

## 2013-10-22 DIAGNOSIS — L570 Actinic keratosis: Secondary | ICD-10-CM | POA: Diagnosis not present

## 2013-10-22 DIAGNOSIS — C44319 Basal cell carcinoma of skin of other parts of face: Secondary | ICD-10-CM | POA: Diagnosis not present

## 2013-10-22 DIAGNOSIS — D235 Other benign neoplasm of skin of trunk: Secondary | ICD-10-CM | POA: Diagnosis not present

## 2013-11-05 ENCOUNTER — Other Ambulatory Visit: Payer: Self-pay

## 2013-12-03 DIAGNOSIS — Z85828 Personal history of other malignant neoplasm of skin: Secondary | ICD-10-CM | POA: Diagnosis not present

## 2013-12-03 DIAGNOSIS — L820 Inflamed seborrheic keratosis: Secondary | ICD-10-CM | POA: Diagnosis not present

## 2013-12-23 ENCOUNTER — Encounter: Payer: Self-pay | Admitting: General Practice

## 2013-12-23 ENCOUNTER — Ambulatory Visit (INDEPENDENT_AMBULATORY_CARE_PROVIDER_SITE_OTHER): Payer: Medicare Other | Admitting: General Practice

## 2013-12-23 VITALS — BP 188/84 | HR 92 | Temp 99.7°F | Wt 151.0 lb

## 2013-12-23 DIAGNOSIS — R509 Fever, unspecified: Secondary | ICD-10-CM

## 2013-12-23 DIAGNOSIS — R05 Cough: Secondary | ICD-10-CM | POA: Diagnosis not present

## 2013-12-23 DIAGNOSIS — Z20828 Contact with and (suspected) exposure to other viral communicable diseases: Secondary | ICD-10-CM

## 2013-12-23 MED ORDER — OSELTAMIVIR PHOSPHATE 75 MG PO CAPS: 75.0000 mg | ORAL_CAPSULE | Freq: Every day | ORAL | Status: DC

## 2013-12-23 MED ORDER — BENZONATATE 200 MG PO CAPS: 200.0000 mg | ORAL_CAPSULE | Freq: Three times a day (TID) | ORAL | Status: DC | PRN

## 2013-12-23 NOTE — Patient Instructions (Signed)

## 2013-12-23 NOTE — Progress Notes (Signed)
   Subjective:    Patient ID: Megan Chapman, female    DOB: [birth year], 79 y.o.   MRN: [id number]  Cough This is a new problem. The current episode started yesterday. The problem has been gradually worsening. The problem occurs every few minutes. The cough is non-productive. Associated symptoms include chills, a fever and rhinorrhea. Pertinent negatives include no chest pain, nasal congestion, postnasal drip, shortness of breath or wheezing. The symptoms are aggravated by lying down. Her past medical history is significant for bronchitis. There is no history of asthma or pneumonia.  Reports granddaughter visited on visited on Monday and was diagnosed with flu.     Review of Systems  Constitutional: Positive for fever and chills.       Unmeasured temperature   HENT: Positive for rhinorrhea. Negative for postnasal drip.   Respiratory: Positive for cough. Negative for chest tightness, shortness of breath and wheezing.   Cardiovascular: Negative for chest pain and palpitations.  All other systems reviewed and are negative.       Objective:   Physical Exam  Constitutional: She is oriented to person, place, and time. She appears well-developed and well-nourished.  Cardiovascular: Normal rate, regular rhythm and normal heart sounds.   Pulmonary/Chest: Effort normal and breath sounds normal. No respiratory distress. She exhibits no tenderness.  Neurological: She is alert and oriented to person, place, and time.  Skin: Skin is warm and dry.  Psychiatric: She has a normal mood and affect.     Results for orders placed in visit on 12/23/13  POCT INFLUENZA A/B      Result Value Range   Influenza A, POC Negative     Influenza B, POC Negative          Assessment & Plan:  1. Exposure to the flu  - oseltamivir (TAMIFLU) 75 MG capsule; Take 1 capsule (75 mg total) by mouth daily.  Dispense: 10 capsule; Refill: 0  2. Fever  - POCT Influenza A/B -adequate fluids -tylenol or motrin as  directed for mild discomfort -RTO if symptoms worsen or unresolved -Patient verbalized understanding Coralie Keens, FNP-C

## 2014-03-08 ENCOUNTER — Telehealth: Payer: Self-pay | Admitting: General Practice

## 2014-03-08 NOTE — Telephone Encounter (Signed)
APPT MADE

## 2014-03-09 ENCOUNTER — Encounter: Payer: Self-pay | Admitting: Family Medicine

## 2014-03-09 ENCOUNTER — Ambulatory Visit (INDEPENDENT_AMBULATORY_CARE_PROVIDER_SITE_OTHER): Payer: Medicare Other | Admitting: Family Medicine

## 2014-03-09 VITALS — BP 119/64 | HR 80 | Temp 98.5°F | Ht 63.0 in | Wt 153.8 lb

## 2014-03-09 DIAGNOSIS — M1990 Unspecified osteoarthritis, unspecified site: Secondary | ICD-10-CM

## 2014-03-09 DIAGNOSIS — M129 Arthropathy, unspecified: Secondary | ICD-10-CM | POA: Diagnosis not present

## 2014-03-09 MED ORDER — MELOXICAM 7.5 MG PO TABS: 7.5000 mg | ORAL_TABLET | Freq: Every day | ORAL | Status: DC

## 2014-03-09 NOTE — Progress Notes (Signed)
   Subjective:    Patient ID: Megan Chapman, female    DOB: 31-Oct-1931, 80 y.o.   MRN: [id number]  HPI  This 80 y.o. female presents for evaluation of arthritis.  She hurts all over.  She has  Been having pain for awhile in her legs.  She thinks the atorvstatin may be contributing.  Review of Systems    No chest pain, SOB, HA, dizziness, vision change, N/V, diarrhea, constipation, dysuria, urinary urgency or frequency, myalgias, arthralgias or rash.  Objective:   Physical Exam Vital signs noted  Well developed well nourished female.  HEENT - Head atraumatic Normocephalic                Eyes - PERRLA, Conjuctiva - clear Sclera- Clear EOMI Respiratory - Lungs CTA bilateral Cardiac - RRR S1 and S2 without murmur MS - Hallux valgus left great toe TTP, TTP DIPs and PIPs bilateral hands       Assessment & Plan:  Arthritis DC atorvastatin Meloxicam 7.5mg  one po qd.  Follow up in 2 weeks  Lysbeth Penner FNP

## 2014-03-23 ENCOUNTER — Ambulatory Visit (INDEPENDENT_AMBULATORY_CARE_PROVIDER_SITE_OTHER): Payer: Medicare Other | Admitting: Family Medicine

## 2014-03-23 VITALS — BP 138/75 | HR 71 | Temp 97.1°F | Wt 151.0 lb

## 2014-03-23 DIAGNOSIS — M129 Arthropathy, unspecified: Secondary | ICD-10-CM | POA: Diagnosis not present

## 2014-03-23 DIAGNOSIS — H109 Unspecified conjunctivitis: Secondary | ICD-10-CM

## 2014-03-23 DIAGNOSIS — E785 Hyperlipidemia, unspecified: Secondary | ICD-10-CM | POA: Diagnosis not present

## 2014-03-23 DIAGNOSIS — M1990 Unspecified osteoarthritis, unspecified site: Secondary | ICD-10-CM

## 2014-03-23 MED ORDER — POLYMYXIN B-TRIMETHOPRIM 10000-0.1 UNIT/ML-% OP SOLN: 1.0000 [drp] | OPHTHALMIC | Status: DC

## 2014-03-23 NOTE — Progress Notes (Signed)
   Subjective:    Patient ID: Megan Chapman, female    DOB: [birth year], 80 y.o.   MRN: [id number]  HPI  This 80 y.o. female presents for evaluation of arthralgias and joint pain. She stopped taking Her atorvastatin and feels a lot better.  She has been having difficulties with polyarthralgias and Arthritis.  She has also been rx'd mobic.  Review of Systems    No chest pain, SOB, HA, dizziness, vision change, N/V, diarrhea, constipation, dysuria, urinary urgency or frequency, myalgias, arthralgias or rash.  Objective:   Physical Exam Vital signs noted  Well developed well nourished female.  HEENT - Head atraumatic Normocephalic                Eyes - PERRLA, Conjuctiva - injected Sclera- Clear EOMI                Ears - EAC's Wnl TM's Wnl Gross Hearing WNL                Nose - Nares patent                 Throat - oropharanx wnl Respiratory - Lungs CTA bilateral Cardiac - RRR S1 and S2 without murmur GI - Abdomen soft Nontender and bowel sounds active x 4 Extremities - No edema. Neuro - Grossly intact. MS - Bilateral hands with DIPS and PIPS with synovial thickening       Assessment & Plan:  Arthritis - Plan: Arthritis Panel and continue mobic and continue to hold atorvastatin  Other and unspecified hyperlipidemia - Plan: CMP14+EGFR, Lipid panel, TSH, CANCELED: POCT CBC  Conjunctivitis - Plan: trimethoprim-polymyxin b (POLYTRIM) ophthalmic   Lysbeth Penner FNP

## 2014-03-24 LAB — ARTHRITIS PANEL
Anti Nuclear Antibody(ANA): NEGATIVE
Rhuematoid fact SerPl-aCnc: 8.5 IU/mL (ref 0.0–13.9)
Sed Rate: 34 mm/hr (ref 0–40)
Uric Acid: 4.8 mg/dL (ref 2.5–7.1)

## 2014-03-24 LAB — LIPID PANEL
Chol/HDL Ratio: 5.5 ratio units — ABNORMAL HIGH (ref 0.0–4.4)
Cholesterol, Total: 219 mg/dL — ABNORMAL HIGH (ref 100–199)
HDL: 40 mg/dL (ref >39)
LDL Calculated: 116 mg/dL — ABNORMAL HIGH (ref 0–99)
Triglycerides: 313 mg/dL — ABNORMAL HIGH (ref 0–149)
VLDL Cholesterol Cal: 63 mg/dL — ABNORMAL HIGH (ref 5–40)

## 2014-03-24 LAB — CMP14+EGFR
ALT: 16 IU/L (ref 0–32)
AST: 15 IU/L (ref 0–40)
Albumin/Globulin Ratio: 1.8 (ref 1.1–2.5)
Albumin: 4.3 g/dL (ref 3.5–4.7)
Alkaline Phosphatase: 62 IU/L (ref 39–117)
BUN/Creatinine Ratio: 25 (ref 11–26)
BUN: 17 mg/dL (ref 8–27)
CO2: 24 mmol/L (ref 18–29)
Calcium: 9.6 mg/dL (ref 8.7–10.3)
Chloride: 103 mmol/L (ref 97–108)
Creatinine, Ser: 0.69 mg/dL (ref 0.57–1.00)
GFR calc Af Amer: 95 mL/min/{1.73_m2} (ref >59)
GFR calc non Af Amer: 82 mL/min/{1.73_m2} (ref >59)
Globulin, Total: 2.4 g/dL (ref 1.5–4.5)
Glucose: 93 mg/dL (ref 65–99)
Potassium: 4.7 mmol/L (ref 3.5–5.2)
Sodium: 143 mmol/L (ref 134–144)
Total Bilirubin: 0.5 mg/dL (ref 0.0–1.2)
Total Protein: 6.7 g/dL (ref 6.0–8.5)

## 2014-03-24 LAB — TSH: TSH: 5.49 u[IU]/mL — ABNORMAL HIGH (ref 0.450–4.500)

## 2014-03-31 ENCOUNTER — Other Ambulatory Visit: Payer: Self-pay | Admitting: Family Medicine

## 2014-03-31 MED ORDER — LEVOTHYROXINE SODIUM 25 MCG PO TABS: 25.0000 ug | ORAL_TABLET | Freq: Every day | ORAL | Status: DC

## 2014-04-22 ENCOUNTER — Ambulatory Visit (INDEPENDENT_AMBULATORY_CARE_PROVIDER_SITE_OTHER): Payer: Medicare Other | Admitting: Family Medicine

## 2014-04-22 ENCOUNTER — Encounter: Payer: Self-pay | Admitting: Family Medicine

## 2014-04-22 VITALS — BP 142/75 | HR 74 | Temp 98.3°F | Ht 63.0 in | Wt 149.8 lb

## 2014-04-22 DIAGNOSIS — F411 Generalized anxiety disorder: Secondary | ICD-10-CM

## 2014-04-22 DIAGNOSIS — E785 Hyperlipidemia, unspecified: Secondary | ICD-10-CM

## 2014-04-22 DIAGNOSIS — M129 Arthropathy, unspecified: Secondary | ICD-10-CM

## 2014-04-22 DIAGNOSIS — M1990 Unspecified osteoarthritis, unspecified site: Secondary | ICD-10-CM

## 2014-04-22 NOTE — Progress Notes (Signed)
   Subjective:    Patient ID: Megan Chapman, female    DOB: Mar 05, 1931, 80 y.o.   MRN: [id number]  HPI  This 80 y.o. female presents for evaluation of arthritis.  She is taking meloxicam and it hurts her stomach and she quit the meloxicam.  She quit her statin and she is not hurting anymore like it was.  Review of Systems    No chest pain, SOB, HA, dizziness, vision change, N/V, diarrhea, constipation, dysuria, urinary urgency or frequency or rash.  Objective:   Physical Exam  Vital signs noted  Well developed well nourished female.  HEENT - Head atraumatic Normocephalic                Eyes - PERRLA, Conjuctiva - clear Sclera- Clear EOMI                Ears - EAC's Wnl TM's Wnl Gross Hearing WNL Respiratory - Lungs CTA bilateral Cardiac - RRR S1 and S2 without murmur GI - Abdomen soft Nontender and bowel sounds active x 4 Extremities - No edema. Neuro - Grossly intact.      Assessment & Plan:  Arthritis - Stop meloxicam and stop statin meds since worsens arthritis  Other and unspecified hyperlipidemia - Take fish oil otc and stop statin  Anxiety state, unspecified - Take xanax prn  Hypothyroidism - Continue levothyroxine and get labs in 3 months  Follow up in 3 months   Lysbeth Penner FNP

## 2014-05-03 ENCOUNTER — Ambulatory Visit (INDEPENDENT_AMBULATORY_CARE_PROVIDER_SITE_OTHER): Payer: Medicare Other | Admitting: Family Medicine

## 2014-05-03 ENCOUNTER — Encounter: Payer: Self-pay | Admitting: Family Medicine

## 2014-05-03 VITALS — BP 138/69 | HR 78 | Temp 98.0°F | Ht 63.0 in | Wt 151.0 lb

## 2014-05-03 DIAGNOSIS — R300 Dysuria: Secondary | ICD-10-CM | POA: Diagnosis not present

## 2014-05-03 DIAGNOSIS — N390 Urinary tract infection, site not specified: Secondary | ICD-10-CM | POA: Diagnosis not present

## 2014-05-03 LAB — POCT URINALYSIS DIPSTICK
Bilirubin, UA: NEGATIVE
GLUCOSE UA: NEGATIVE
Ketones, UA: NEGATIVE
NITRITE UA: NEGATIVE
SPEC GRAV UA: 1.015
UROBILINOGEN UA: NEGATIVE
pH, UA: 6.5

## 2014-05-03 LAB — POCT UA - MICROSCOPIC ONLY
Casts, Ur, LPF, POC: NEGATIVE
Crystals, Ur, HPF, POC: NEGATIVE
Mucus, UA: NEGATIVE
Yeast, UA: NEGATIVE

## 2014-05-03 MED ORDER — CIPROFLOXACIN HCL 500 MG PO TABS: 500.0000 mg | ORAL_TABLET | Freq: Two times a day (BID) | ORAL | Status: DC

## 2014-05-03 NOTE — Progress Notes (Signed)
   Subjective:    Patient ID: Megan Chapman, female    DOB: 1931-11-20, 81 y.o.   MRN: [id number]  HPI DYSURIA Onset:  2 days  Description: dysuria, increased urinary frequency  Modifying factors: none   Symptoms Urgency:  yes Frequency: yes  Hesitancy:  yes Hematuria:  no Flank Pain:  no Fever: no Nausea/Vomiting:  no Missed LMP: no STD exposure: no Discharge: no Irritants: no Rash: no  Red Flags   More than 3 UTI's last 12 months:  no PMH of  Diabetes or Immunosuppression:  no Renal Disease/Calculi: no Urinary Tract Abnormality:  no Instrumentation or Trauma: no      Review of Systems  All other systems reviewed and are negative.      Objective:   Physical Exam  Constitutional: She is oriented to person, place, and time. She appears well-developed.  HENT:  Head: Normocephalic and atraumatic.  Eyes: Conjunctivae are normal. Pupils are equal, round, and reactive to light.  Neck: Normal range of motion. Neck supple.  Cardiovascular: Normal rate and regular rhythm.   Pulmonary/Chest: Effort normal and breath sounds normal.  Abdominal: Soft. Bowel sounds are normal.  No flank pain  Minimal suprapubic tenderness   Musculoskeletal: Normal range of motion.  Neurological: She is alert and oriented to person, place, and time.  Skin: Skin is warm.          Assessment & Plan:  Dysuria - Plan: POCT urinalysis dipstick, POCT UA - Microscopic Only, Urine culture, ciprofloxacin (CIPRO) 500 MG tablet  Urinary tract infection, site not specified - Plan: Urine culture   Will treat with cipro (PCN allergy) Urine culture  Discussed general care and infectious/GU red flags at length including fever, nausea, back pain.  Pt expressed understanding Follow up as needed.

## 2014-05-05 LAB — URINE CULTURE

## 2014-05-25 DIAGNOSIS — Z1231 Encounter for screening mammogram for malignant neoplasm of breast: Secondary | ICD-10-CM | POA: Diagnosis not present

## 2014-07-13 ENCOUNTER — Encounter: Payer: Self-pay | Admitting: Family Medicine

## 2014-07-13 ENCOUNTER — Ambulatory Visit (INDEPENDENT_AMBULATORY_CARE_PROVIDER_SITE_OTHER): Payer: Medicare Other | Admitting: Family Medicine

## 2014-07-13 VITALS — BP 145/67 | HR 68 | Temp 97.0°F | Wt 151.2 lb

## 2014-07-13 DIAGNOSIS — R5383 Other fatigue: Secondary | ICD-10-CM

## 2014-07-13 DIAGNOSIS — E785 Hyperlipidemia, unspecified: Secondary | ICD-10-CM

## 2014-07-13 DIAGNOSIS — E038 Other specified hypothyroidism: Secondary | ICD-10-CM | POA: Diagnosis not present

## 2014-07-13 DIAGNOSIS — R5381 Other malaise: Secondary | ICD-10-CM

## 2014-07-13 LAB — POCT CBC
Granulocyte percent: 62 %G (ref 37–80)
HCT, POC: 41.4 % (ref 37.7–47.9)
Hemoglobin: 13.3 g/dL (ref 12.2–16.2)
Lymph, poc: 1.3 (ref 0.6–3.4)
MCH, POC: 28.4 pg (ref 27–31.2)
MCHC: 32 g/dL (ref 31.8–35.4)
MCV: 88.7 fL (ref 80–97)
MPV: 7.9 fL (ref 0–99.8)
POC Granulocyte: 2.3 (ref 2–6.9)
POC LYMPH PERCENT: 35 %L (ref 10–50)
Platelet Count, POC: 247 10*3/uL (ref 142–424)
RBC: 4.7 M/uL (ref 4.04–5.48)
RDW, POC: 14.5 %
WBC: 3.7 10*3/uL — AB (ref 4.6–10.2)

## 2014-07-13 NOTE — Progress Notes (Signed)
   Subjective:    Patient ID: Megan Chapman, female    DOB: 1931/06/11, 80 y.o.   MRN: [id number]  HPI This 80 y.o. female presents for evaluation of hyperlipidemia and hypothyroidism. She is intolerant to statins because of arthritic pain and pain in her legs.  She is walking Better and doing better since stopping the statin and she is taking fish oil tablets otc And she is tolerating them fine.   Review of Systems    No chest pain, SOB, HA, dizziness, vision change, N/V, diarrhea, constipation, dysuria, urinary urgency or frequency, myalgias, arthralgias or rash.  Objective:   Physical Exam  Vital signs noted  Well developed well nourished female.  HEENT - Head atraumatic Normocephalic                Eyes - PERRLA, Conjuctiva - clear Sclera- Clear EOMI                Ears - EAC's Wnl TM's Wnl Gross Hearing WNL                Nose - Nares patent                 Throat - oropharanx wnl Respiratory - Lungs CTA bilateral Cardiac - RRR S1 and S2 without murmur GI - Abdomen soft Nontender and bowel sounds active x 4 Extremities - No edema. Neuro - Grossly intact.      Assessment & Plan:  Hyperlipemia - Plan: CMP14+EGFR, Lipid panel  Other specified hypothyroidism - Plan: Thyroid Panel With TSH  Other malaise and fatigue - Plan: POCT CBC  Follow up in 4 months  Lysbeth Penner FNP

## 2014-07-14 ENCOUNTER — Other Ambulatory Visit: Payer: Self-pay | Admitting: Family Medicine

## 2014-07-14 LAB — LIPID PANEL
Chol/HDL Ratio: 6.4 ratio units — ABNORMAL HIGH (ref 0.0–4.4)
Cholesterol, Total: 269 mg/dL — ABNORMAL HIGH (ref 100–199)
HDL: 42 mg/dL (ref >39)
LDL Calculated: 159 mg/dL — ABNORMAL HIGH (ref 0–99)
Triglycerides: 342 mg/dL — ABNORMAL HIGH (ref 0–149)
VLDL Cholesterol Cal: 68 mg/dL — ABNORMAL HIGH (ref 5–40)

## 2014-07-14 LAB — CMP14+EGFR
ALT: 13 IU/L (ref 0–32)
AST: 15 IU/L (ref 0–40)
Albumin/Globulin Ratio: 1.6 (ref 1.1–2.5)
Albumin: 4.2 g/dL (ref 3.5–4.7)
Alkaline Phosphatase: 57 IU/L (ref 39–117)
BUN/Creatinine Ratio: 21 (ref 11–26)
BUN: 16 mg/dL (ref 8–27)
CO2: 23 mmol/L (ref 18–29)
Calcium: 9.6 mg/dL (ref 8.7–10.3)
Chloride: 101 mmol/L (ref 97–108)
Creatinine, Ser: 0.75 mg/dL (ref 0.57–1.00)
GFR calc Af Amer: 87 mL/min/{1.73_m2} (ref >59)
GFR calc non Af Amer: 76 mL/min/{1.73_m2} (ref >59)
Globulin, Total: 2.7 g/dL (ref 1.5–4.5)
Glucose: 83 mg/dL (ref 65–99)
Potassium: 4.7 mmol/L (ref 3.5–5.2)
Sodium: 140 mmol/L (ref 134–144)
Total Bilirubin: 0.5 mg/dL (ref 0.0–1.2)
Total Protein: 6.9 g/dL (ref 6.0–8.5)

## 2014-07-14 LAB — THYROID PANEL WITH TSH
Free Thyroxine Index: 1.7 (ref 1.2–4.9)
T3 Uptake Ratio: 32 % (ref 24–39)
T4, Total: 5.3 ug/dL (ref 4.5–12.0)
TSH: 4.14 u[IU]/mL (ref 0.450–4.500)

## 2014-07-15 ENCOUNTER — Telehealth: Payer: Self-pay | Admitting: Family Medicine

## 2014-07-15 NOTE — Telephone Encounter (Signed)
Message copied by Waverly Ferrari on Thu Jul 15, 2014  9:36 AM ------      Message from: Lysbeth Penner      Created: Wed Jul 14, 2014  2:25 PM       Take fish oil tablets otc for elevated trigs and lipids ------

## 2014-07-20 ENCOUNTER — Other Ambulatory Visit: Payer: Self-pay | Admitting: Family Medicine

## 2014-07-20 MED ORDER — PRAVASTATIN SODIUM 20 MG PO TABS: 20.0000 mg | ORAL_TABLET | Freq: Every day | ORAL | Status: DC

## 2014-07-28 DIAGNOSIS — Z85828 Personal history of other malignant neoplasm of skin: Secondary | ICD-10-CM | POA: Diagnosis not present

## 2014-07-28 DIAGNOSIS — D235 Other benign neoplasm of skin of trunk: Secondary | ICD-10-CM | POA: Diagnosis not present

## 2014-07-28 DIAGNOSIS — L570 Actinic keratosis: Secondary | ICD-10-CM | POA: Diagnosis not present

## 2014-07-28 DIAGNOSIS — L820 Inflamed seborrheic keratosis: Secondary | ICD-10-CM | POA: Diagnosis not present

## 2014-08-16 ENCOUNTER — Telehealth: Payer: Self-pay | Admitting: Nurse Practitioner

## 2014-08-16 ENCOUNTER — Ambulatory Visit (INDEPENDENT_AMBULATORY_CARE_PROVIDER_SITE_OTHER): Payer: Medicare Other | Admitting: Family

## 2014-08-16 ENCOUNTER — Encounter: Payer: Self-pay | Admitting: Family

## 2014-08-16 VITALS — BP 139/74 | HR 74 | Temp 98.9°F | Ht 63.0 in | Wt 153.2 lb

## 2014-08-16 DIAGNOSIS — N390 Urinary tract infection, site not specified: Secondary | ICD-10-CM | POA: Diagnosis not present

## 2014-08-16 DIAGNOSIS — R300 Dysuria: Secondary | ICD-10-CM | POA: Diagnosis not present

## 2014-08-16 LAB — POCT URINALYSIS DIPSTICK
Bilirubin, UA: NEGATIVE
Glucose, UA: NEGATIVE
Ketones, UA: NEGATIVE
NITRITE UA: NEGATIVE
PH UA: 5
PROTEIN UA: NEGATIVE
Spec Grav, UA: 1.02
UROBILINOGEN UA: NEGATIVE

## 2014-08-16 LAB — POCT UA - MICROSCOPIC ONLY
CASTS, UR, LPF, POC: NEGATIVE
CRYSTALS, UR, HPF, POC: NEGATIVE
YEAST UA: NEGATIVE

## 2014-08-16 MED ORDER — CIPROFLOXACIN HCL 500 MG PO TABS: 500.0000 mg | ORAL_TABLET | Freq: Two times a day (BID) | ORAL | Status: DC

## 2014-08-16 NOTE — Patient Instructions (Signed)

## 2014-08-16 NOTE — Progress Notes (Signed)
   Subjective:    Patient ID: Megan Chapman, female    DOB: Jul 15, 1931, 80 y.o.   MRN: [id number]  Dysuria  This is a new problem. The current episode started yesterday. The problem occurs intermittently. The problem has been waxing and waning. The quality of the pain is described as burning. The pain is at a severity of 8/10. The pain is mild. Associated symptoms include frequency, hesitancy and urgency. Pertinent negatives include no chills, flank pain, hematuria, nausea or vomiting. She has tried increased fluids for the symptoms. The treatment provided mild relief.      Review of Systems  Constitutional: Negative.  Negative for chills.  HENT: Negative.   Eyes: Negative.   Respiratory: Negative.  Negative for shortness of breath.   Cardiovascular: Negative.  Negative for palpitations.  Gastrointestinal: Negative.  Negative for nausea and vomiting.  Endocrine: Negative.   Genitourinary: Positive for dysuria, hesitancy, urgency and frequency. Negative for hematuria and flank pain.  Musculoskeletal: Negative.   Neurological: Negative.  Negative for headaches.  Hematological: Negative.   Psychiatric/Behavioral: Negative.   All other systems reviewed and are negative.      Objective:   Physical Exam  Vitals reviewed. Constitutional: She is oriented to person, place, and time. She appears well-developed and well-nourished. No distress.  Eyes: Pupils are equal, round, and reactive to light.  Cardiovascular: Normal rate, regular rhythm, normal heart sounds and intact distal pulses.   No murmur heard. Pulmonary/Chest: Effort normal and breath sounds normal. No respiratory distress. She has no wheezes.  Abdominal: Soft. Bowel sounds are normal. She exhibits no distension. There is no tenderness.  Musculoskeletal: Normal range of motion. She exhibits no edema and no tenderness.  Negative CVA tenderness  Neurological: She is alert and oriented to person, place, and time. She has normal  reflexes. No cranial nerve deficit.  Skin: Skin is warm and dry.  Psychiatric: She has a normal mood and affect. Her behavior is normal. Judgment and thought content normal.      BP 139/74  Pulse 74  Temp(Src) 98.9 F (37.2 C) (Oral)  Ht 5\' 3"  (1.6 m)  Wt 153 lb 3.2 oz (69.491 kg)  BMI 27.14 kg/m2     Assessment & Plan:  1. Dysuria - POCT UA - Microscopic Only - POCT urinalysis dipstick  2. Urinary tract infection, site not specified Force fluids AZO over the counter X2 days RTO prn Meds ordered this encounter  Medications  . ciprofloxacin (CIPRO) 500 MG tablet    Sig: Take 1 tablet (500 mg total) by mouth 2 (two) times daily.    Dispense:  20 tablet    Refill:  0    Order Specific Question:  Supervising Provider    Answer:  Chipper Herb [[id number]]      Evelina Dun, FNP

## 2014-08-16 NOTE — Telephone Encounter (Signed)
Patient aware that she would need to be seen and appointment scheduled

## 2014-09-22 ENCOUNTER — Other Ambulatory Visit: Payer: Self-pay | Admitting: Family Medicine

## 2014-09-23 DIAGNOSIS — H526 Other disorders of refraction: Secondary | ICD-10-CM | POA: Diagnosis not present

## 2014-09-23 DIAGNOSIS — H26499 Other secondary cataract, unspecified eye: Secondary | ICD-10-CM | POA: Diagnosis not present

## 2014-09-23 DIAGNOSIS — H3530 Unspecified macular degeneration: Secondary | ICD-10-CM | POA: Diagnosis not present

## 2014-09-30 DIAGNOSIS — Z85828 Personal history of other malignant neoplasm of skin: Secondary | ICD-10-CM | POA: Diagnosis not present

## 2014-09-30 DIAGNOSIS — X32XXXD Exposure to sunlight, subsequent encounter: Secondary | ICD-10-CM | POA: Diagnosis not present

## 2014-09-30 DIAGNOSIS — L820 Inflamed seborrheic keratosis: Secondary | ICD-10-CM | POA: Diagnosis not present

## 2014-09-30 DIAGNOSIS — L570 Actinic keratosis: Secondary | ICD-10-CM | POA: Diagnosis not present

## 2014-11-15 ENCOUNTER — Ambulatory Visit (INDEPENDENT_AMBULATORY_CARE_PROVIDER_SITE_OTHER): Payer: Medicare Other | Admitting: *Deleted

## 2014-11-15 ENCOUNTER — Ambulatory Visit (INDEPENDENT_AMBULATORY_CARE_PROVIDER_SITE_OTHER): Payer: Medicare Other | Admitting: Family Medicine

## 2014-11-15 ENCOUNTER — Encounter: Payer: Self-pay | Admitting: Family Medicine

## 2014-11-15 VITALS — BP 145/72 | HR 83 | Temp 97.0°F | Ht 63.0 in | Wt 151.2 lb

## 2014-11-15 DIAGNOSIS — Z23 Encounter for immunization: Secondary | ICD-10-CM

## 2014-11-15 DIAGNOSIS — E785 Hyperlipidemia, unspecified: Secondary | ICD-10-CM

## 2014-11-15 DIAGNOSIS — E038 Other specified hypothyroidism: Secondary | ICD-10-CM

## 2014-11-15 LAB — POCT CBC
Granulocyte percent: 53.4 %G (ref 37–80)
HCT, POC: 40.5 % (ref 37.7–47.9)
Hemoglobin: 13.1 g/dL (ref 12.2–16.2)
Lymph, poc: 1.6 (ref 0.6–3.4)
MCH, POC: 28.7 pg (ref 27–31.2)
MCHC: 32.4 g/dL (ref 31.8–35.4)
MCV: 88.5 fL (ref 80–97)
MPV: 7.8 fL (ref 0–99.8)
POC Granulocyte: 2.1 (ref 2–6.9)
POC LYMPH PERCENT: 40.8 %L (ref 10–50)
Platelet Count, POC: 245 10*3/uL (ref 142–424)
RBC: 4.6 M/uL (ref 4.04–5.48)
RDW, POC: 14.1 %
WBC: 3.9 10*3/uL — AB (ref 4.6–10.2)

## 2014-11-15 NOTE — Progress Notes (Signed)
   Subjective:    Patient ID: Megan Chapman, female    DOB: [birth year], 80 y.o.   MRN: [id number]  HPI Patient is here for routine visit.  Review of Systems  Constitutional: Negative for fever.  HENT: Negative for ear pain.   Eyes: Negative for discharge.  Respiratory: Negative for cough.   Cardiovascular: Negative for chest pain.  Gastrointestinal: Negative for abdominal distention.  Endocrine: Negative for polyuria.  Genitourinary: Negative for difficulty urinating.  Musculoskeletal: Negative for gait problem and neck pain.  Skin: Negative for color change and rash.  Neurological: Negative for speech difficulty and headaches.  Psychiatric/Behavioral: Negative for agitation.       Objective:    BP 145/72 mmHg  Pulse 83  Temp(Src) 97 F (36.1 C) (Oral)  Ht $R'5\' 3"'nm$  (1.6 m)  Wt 151 lb 3.2 oz (68.584 kg)  BMI 26.79 kg/m2 Physical Exam  Constitutional: She is oriented to person, place, and time. She appears well-developed and well-nourished.  HENT:  Head: Normocephalic and atraumatic.  Mouth/Throat: Oropharynx is clear and moist.  Eyes: Pupils are equal, round, and reactive to light.  Neck: Normal range of motion. Neck supple.  Cardiovascular: Normal rate and regular rhythm.   No murmur heard. Pulmonary/Chest: Effort normal and breath sounds normal.  Abdominal: Soft. Bowel sounds are normal. There is no tenderness.  Neurological: She is alert and oriented to person, place, and time.  Skin: Skin is warm and dry.  Psychiatric: She has a normal mood and affect.          Assessment & Plan:     ICD-9-CM ICD-10-CM   1. Other specified hypothyroidism 244.8 E03.8 TSH  2. Hyperlipidemia 272.4 E78.5 POCT CBC     Lipid panel     CMP14+EGFR     Return in about 1 year (around 11/16/2015).  Lysbeth Penner FNP

## 2014-11-16 LAB — CMP14+EGFR
ALT: 11 IU/L (ref 0–32)
AST: 13 IU/L (ref 0–40)
Albumin/Globulin Ratio: 1.4 (ref 1.1–2.5)
Albumin: 4.2 g/dL (ref 3.5–4.7)
Alkaline Phosphatase: 54 IU/L (ref 39–117)
BUN/Creatinine Ratio: 19 (ref 11–26)
BUN: 15 mg/dL (ref 8–27)
CO2: 24 mmol/L (ref 18–29)
Calcium: 9.4 mg/dL (ref 8.7–10.3)
Chloride: 101 mmol/L (ref 97–108)
Creatinine, Ser: 0.77 mg/dL (ref 0.57–1.00)
GFR calc Af Amer: 84 mL/min/{1.73_m2} (ref >59)
GFR calc non Af Amer: 73 mL/min/{1.73_m2} (ref >59)
Globulin, Total: 2.9 g/dL (ref 1.5–4.5)
Glucose: 86 mg/dL (ref 65–99)
Potassium: 4.8 mmol/L (ref 3.5–5.2)
Sodium: 138 mmol/L (ref 134–144)
Total Bilirubin: 0.5 mg/dL (ref 0.0–1.2)
Total Protein: 7.1 g/dL (ref 6.0–8.5)

## 2014-11-16 LAB — LIPID PANEL
Chol/HDL Ratio: 4.2 ratio units (ref 0.0–4.4)
Cholesterol, Total: 212 mg/dL — ABNORMAL HIGH (ref 100–199)
HDL: 51 mg/dL (ref >39)
LDL Calculated: 125 mg/dL — ABNORMAL HIGH (ref 0–99)
Triglycerides: 181 mg/dL — ABNORMAL HIGH (ref 0–149)
VLDL Cholesterol Cal: 36 mg/dL (ref 5–40)

## 2014-11-16 LAB — TSH: TSH: 2.45 u[IU]/mL (ref 0.450–4.500)

## 2015-01-27 DIAGNOSIS — Z471 Aftercare following joint replacement surgery: Secondary | ICD-10-CM | POA: Diagnosis not present

## 2015-01-27 DIAGNOSIS — M25561 Pain in right knee: Secondary | ICD-10-CM | POA: Diagnosis not present

## 2015-01-27 DIAGNOSIS — Z9889 Other specified postprocedural states: Secondary | ICD-10-CM | POA: Diagnosis not present

## 2015-01-27 DIAGNOSIS — M25562 Pain in left knee: Secondary | ICD-10-CM | POA: Diagnosis not present

## 2015-01-27 DIAGNOSIS — M1711 Unilateral primary osteoarthritis, right knee: Secondary | ICD-10-CM | POA: Diagnosis not present

## 2015-01-27 DIAGNOSIS — Z96652 Presence of left artificial knee joint: Secondary | ICD-10-CM | POA: Diagnosis not present

## 2015-02-12 DIAGNOSIS — M23321 Other meniscus derangements, posterior horn of medial meniscus, right knee: Secondary | ICD-10-CM | POA: Diagnosis not present

## 2015-02-12 DIAGNOSIS — M222X1 Patellofemoral disorders, right knee: Secondary | ICD-10-CM | POA: Diagnosis not present

## 2015-02-12 DIAGNOSIS — M25461 Effusion, right knee: Secondary | ICD-10-CM | POA: Diagnosis not present

## 2015-02-12 DIAGNOSIS — M23361 Other meniscus derangements, other lateral meniscus, right knee: Secondary | ICD-10-CM | POA: Diagnosis not present

## 2015-02-12 DIAGNOSIS — M7121 Synovial cyst of popliteal space [Baker], right knee: Secondary | ICD-10-CM | POA: Diagnosis not present

## 2015-02-12 DIAGNOSIS — M659 Synovitis and tenosynovitis, unspecified: Secondary | ICD-10-CM | POA: Diagnosis not present

## 2015-02-28 DIAGNOSIS — S83241A Other tear of medial meniscus, current injury, right knee, initial encounter: Secondary | ICD-10-CM | POA: Diagnosis not present

## 2015-02-28 DIAGNOSIS — M1711 Unilateral primary osteoarthritis, right knee: Secondary | ICD-10-CM | POA: Diagnosis not present

## 2015-03-04 DIAGNOSIS — M179 Osteoarthritis of knee, unspecified: Secondary | ICD-10-CM | POA: Diagnosis not present

## 2015-03-05 DIAGNOSIS — Z01810 Encounter for preprocedural cardiovascular examination: Secondary | ICD-10-CM | POA: Diagnosis not present

## 2015-03-11 DIAGNOSIS — Z96652 Presence of left artificial knee joint: Secondary | ICD-10-CM | POA: Diagnosis not present

## 2015-03-11 DIAGNOSIS — J45909 Unspecified asthma, uncomplicated: Secondary | ICD-10-CM | POA: Diagnosis not present

## 2015-03-11 DIAGNOSIS — K219 Gastro-esophageal reflux disease without esophagitis: Secondary | ICD-10-CM | POA: Diagnosis not present

## 2015-03-11 DIAGNOSIS — Z882 Allergy status to sulfonamides status: Secondary | ICD-10-CM | POA: Diagnosis not present

## 2015-03-11 DIAGNOSIS — Z8042 Family history of malignant neoplasm of prostate: Secondary | ICD-10-CM | POA: Diagnosis not present

## 2015-03-11 DIAGNOSIS — Z825 Family history of asthma and other chronic lower respiratory diseases: Secondary | ICD-10-CM | POA: Diagnosis not present

## 2015-03-11 DIAGNOSIS — Z8249 Family history of ischemic heart disease and other diseases of the circulatory system: Secondary | ICD-10-CM | POA: Diagnosis not present

## 2015-03-11 DIAGNOSIS — Z885 Allergy status to narcotic agent status: Secondary | ICD-10-CM | POA: Diagnosis not present

## 2015-03-11 DIAGNOSIS — E785 Hyperlipidemia, unspecified: Secondary | ICD-10-CM | POA: Diagnosis not present

## 2015-03-11 DIAGNOSIS — S83241A Other tear of medial meniscus, current injury, right knee, initial encounter: Secondary | ICD-10-CM | POA: Diagnosis not present

## 2015-03-11 DIAGNOSIS — E079 Disorder of thyroid, unspecified: Secondary | ICD-10-CM | POA: Diagnosis not present

## 2015-03-11 DIAGNOSIS — Z800 Family history of malignant neoplasm of digestive organs: Secondary | ICD-10-CM | POA: Diagnosis not present

## 2015-03-11 DIAGNOSIS — Z7982 Long term (current) use of aspirin: Secondary | ICD-10-CM | POA: Diagnosis not present

## 2015-03-11 DIAGNOSIS — M170 Bilateral primary osteoarthritis of knee: Secondary | ICD-10-CM | POA: Diagnosis not present

## 2015-03-11 DIAGNOSIS — Z801 Family history of malignant neoplasm of trachea, bronchus and lung: Secondary | ICD-10-CM | POA: Diagnosis not present

## 2015-03-11 DIAGNOSIS — Z881 Allergy status to other antibiotic agents status: Secondary | ICD-10-CM | POA: Diagnosis not present

## 2015-03-15 DIAGNOSIS — M79669 Pain in unspecified lower leg: Secondary | ICD-10-CM | POA: Diagnosis not present

## 2015-03-18 ENCOUNTER — Telehealth: Payer: Self-pay | Admitting: Nurse Practitioner

## 2015-03-18 MED ORDER — CIPROFLOXACIN HCL 500 MG PO TABS: 500.0000 mg | ORAL_TABLET | Freq: Two times a day (BID) | ORAL | Status: DC

## 2015-03-18 NOTE — Telephone Encounter (Signed)
cipro rx sent to pharmacy. 

## 2015-03-23 DIAGNOSIS — Z96652 Presence of left artificial knee joint: Secondary | ICD-10-CM | POA: Diagnosis not present

## 2015-03-23 DIAGNOSIS — S83241A Other tear of medial meniscus, current injury, right knee, initial encounter: Secondary | ICD-10-CM | POA: Diagnosis not present

## 2015-03-23 DIAGNOSIS — M1711 Unilateral primary osteoarthritis, right knee: Secondary | ICD-10-CM | POA: Diagnosis not present

## 2015-03-25 DIAGNOSIS — M25561 Pain in right knee: Secondary | ICD-10-CM | POA: Diagnosis not present

## 2015-03-25 DIAGNOSIS — R262 Difficulty in walking, not elsewhere classified: Secondary | ICD-10-CM | POA: Diagnosis not present

## 2015-03-25 DIAGNOSIS — S83241A Other tear of medial meniscus, current injury, right knee, initial encounter: Secondary | ICD-10-CM | POA: Diagnosis not present

## 2015-03-25 DIAGNOSIS — M1711 Unilateral primary osteoarthritis, right knee: Secondary | ICD-10-CM | POA: Diagnosis not present

## 2015-03-30 DIAGNOSIS — M1711 Unilateral primary osteoarthritis, right knee: Secondary | ICD-10-CM | POA: Diagnosis not present

## 2015-03-30 DIAGNOSIS — R262 Difficulty in walking, not elsewhere classified: Secondary | ICD-10-CM | POA: Diagnosis not present

## 2015-03-30 DIAGNOSIS — M25561 Pain in right knee: Secondary | ICD-10-CM | POA: Diagnosis not present

## 2015-03-30 DIAGNOSIS — S83241A Other tear of medial meniscus, current injury, right knee, initial encounter: Secondary | ICD-10-CM | POA: Diagnosis not present

## 2015-03-31 DIAGNOSIS — C44301 Unspecified malignant neoplasm of skin of nose: Secondary | ICD-10-CM | POA: Diagnosis not present

## 2015-03-31 DIAGNOSIS — L570 Actinic keratosis: Secondary | ICD-10-CM | POA: Diagnosis not present

## 2015-03-31 DIAGNOSIS — L986 Other infiltrative disorders of the skin and subcutaneous tissue: Secondary | ICD-10-CM | POA: Diagnosis not present

## 2015-03-31 DIAGNOSIS — X32XXXD Exposure to sunlight, subsequent encounter: Secondary | ICD-10-CM | POA: Diagnosis not present

## 2015-04-01 DIAGNOSIS — S83241A Other tear of medial meniscus, current injury, right knee, initial encounter: Secondary | ICD-10-CM | POA: Diagnosis not present

## 2015-04-01 DIAGNOSIS — M25561 Pain in right knee: Secondary | ICD-10-CM | POA: Diagnosis not present

## 2015-04-01 DIAGNOSIS — M1711 Unilateral primary osteoarthritis, right knee: Secondary | ICD-10-CM | POA: Diagnosis not present

## 2015-04-01 DIAGNOSIS — R262 Difficulty in walking, not elsewhere classified: Secondary | ICD-10-CM | POA: Diagnosis not present

## 2015-04-04 DIAGNOSIS — M1711 Unilateral primary osteoarthritis, right knee: Secondary | ICD-10-CM | POA: Diagnosis not present

## 2015-04-04 DIAGNOSIS — S83241A Other tear of medial meniscus, current injury, right knee, initial encounter: Secondary | ICD-10-CM | POA: Diagnosis not present

## 2015-04-04 DIAGNOSIS — R262 Difficulty in walking, not elsewhere classified: Secondary | ICD-10-CM | POA: Diagnosis not present

## 2015-04-04 DIAGNOSIS — M25561 Pain in right knee: Secondary | ICD-10-CM | POA: Diagnosis not present

## 2015-04-05 ENCOUNTER — Ambulatory Visit (INDEPENDENT_AMBULATORY_CARE_PROVIDER_SITE_OTHER): Payer: Medicare Other | Admitting: Family Medicine

## 2015-04-05 ENCOUNTER — Ambulatory Visit (INDEPENDENT_AMBULATORY_CARE_PROVIDER_SITE_OTHER): Payer: Medicare Other

## 2015-04-05 ENCOUNTER — Encounter: Payer: Self-pay | Admitting: Family Medicine

## 2015-04-05 VITALS — BP 144/75 | HR 74 | Temp 98.7°F | Ht 63.0 in | Wt 154.0 lb

## 2015-04-05 DIAGNOSIS — E038 Other specified hypothyroidism: Secondary | ICD-10-CM

## 2015-04-05 DIAGNOSIS — Z780 Asymptomatic menopausal state: Secondary | ICD-10-CM

## 2015-04-05 DIAGNOSIS — E559 Vitamin D deficiency, unspecified: Secondary | ICD-10-CM | POA: Diagnosis not present

## 2015-04-05 DIAGNOSIS — E785 Hyperlipidemia, unspecified: Secondary | ICD-10-CM | POA: Diagnosis not present

## 2015-04-05 DIAGNOSIS — R5383 Other fatigue: Secondary | ICD-10-CM | POA: Diagnosis not present

## 2015-04-05 DIAGNOSIS — M129 Arthropathy, unspecified: Secondary | ICD-10-CM

## 2015-04-05 DIAGNOSIS — M1711 Unilateral primary osteoarthritis, right knee: Secondary | ICD-10-CM

## 2015-04-05 NOTE — Progress Notes (Signed)
Subjective:  Patient ID: Megan Chapman, female    DOB: Jun 02, 1931  Age: 81 y.o. MRN: [id number]  CC: No chief complaint on file.   HPI Megan Chapman presents for pain in the knee on the right that slows her down frequently. She also has some concerns about anxiety but she says she rarely uses the medication alprazolam. Also of note is that she does not have any thyroid symptoms that she is aware of. she just takes the medicine because the doctor told her to.  Patient in for follow-up of elevated cholesterol. Doing well without complaints on current medication. Denies side effects of statin including myalgia and arthralgia and nausea. Also in today for liver function testing. Currently no chest pain, shortness of breath or other cardiovascular related symptoms noted. Patient has history of being postmenopausal and not on any kind of treatment for osteopenia. She is also in today for bone density testing/DEXA.  History Megan Chapman has a past medical history of GERD (gastroesophageal reflux disease); Asthma; Hyperlipidemia; Migraine; and Thyroid disease.   She has past surgical history that includes Partial knee arthroplasty.   Her family history is not on file.She reports that she has never smoked. She does not have any smokeless tobacco history on file. She reports that she does not drink alcohol or use illicit drugs.  Current Outpatient Prescriptions on File Prior to Visit  Medication Sig Dispense Refill  . ALPRAZolam (XANAX) 0.5 MG tablet Take 0.5 mg by mouth at bedtime as needed for sleep.    . Cholecalciferol (VITAMIN D3) 1000 UNITS CAPS Take by mouth.    . fish oil-omega-3 fatty acids 1000 MG capsule Take 2 g by mouth daily.    [name] levothyroxine (SYNTHROID, LEVOTHROID) 25 MCG tablet TAKE 1 TABLET (25 MCG TOTAL) BY MOUTH DAILY BEFORE BREAKFAST. 30 tablet 8  . Multiple Minerals-Vitamins (CALCIUM & VIT D3 BONE HEALTH PO) Take 1 tablet by mouth daily.    . pravastatin (PRAVACHOL) 20 MG tablet Take 1  tablet (20 mg total) by mouth daily. 90 tablet 3  . aspirin 81 MG tablet Take 81 mg by mouth daily.     No current facility-administered medications on file prior to visit.    ROS Review of Systems  Constitutional: Negative for fever, chills, diaphoresis, appetite change, fatigue and unexpected weight change.  HENT: Negative for congestion, ear pain, hearing loss, postnasal drip, rhinorrhea, sneezing, sore throat and trouble swallowing.   Eyes: Negative for pain.  Respiratory: Negative for cough (right knee), chest tightness and shortness of breath.   Cardiovascular: Negative for chest pain and palpitations.  Gastrointestinal: Negative for nausea, vomiting, abdominal pain, diarrhea and constipation.  Genitourinary: Negative for dysuria, frequency and menstrual problem.  Musculoskeletal: Positive for arthralgias. Negative for joint swelling.  Skin: Negative for rash.  Neurological: Negative for dizziness, weakness, numbness and headaches.  Psychiatric/Behavioral: Negative for dysphoric mood and agitation.    Objective:  BP 144/75 mmHg  Pulse 74  Temp(Src) 98.7 F (37.1 C) (Oral)  Ht '5\' 3"'  (1.6 m)  Wt 154 lb (69.854 kg)  BMI 27.29 kg/m2  BP Readings from Last 3 Encounters:  04/05/15 144/75  11/15/14 145/72  08/16/14 139/74    Wt Readings from Last 3 Encounters:  04/05/15 154 lb (69.854 kg)  11/15/14 151 lb 3.2 oz (68.584 kg)  08/16/14 153 lb 3.2 oz (69.491 kg)     Physical Exam  Constitutional: She is oriented to person, place, and time. She appears well-developed and well-nourished. No distress.  HENT:  Head: Normocephalic and atraumatic.  Right Ear: External ear normal.  Left Ear: External ear normal.  Nose: Nose normal.  Mouth/Throat: Oropharynx is clear and moist.  Eyes: Conjunctivae and EOM are normal. Pupils are equal, round, and reactive to light.  Neck: Normal range of motion. Neck supple. No thyromegaly present.  Cardiovascular: Normal rate, regular rhythm  and normal heart sounds.   No murmur heard. Pulmonary/Chest: Effort normal and breath sounds normal. No respiratory distress. She has no wheezes. She has no rales.  Abdominal: Soft. Bowel sounds are normal. She exhibits no distension. There is no tenderness.  Lymphadenopathy:    She has no cervical adenopathy.  Neurological: She is alert and oriented to person, place, and time. She has normal reflexes.  Skin: Skin is warm and dry.  Psychiatric: She has a normal mood and affect. Her behavior is normal. Judgment and thought content normal.    No results found for: HGBA1C  Lab Results  Component Value Date   WBC 3.9* 11/15/2014   HGB 13.1 11/15/2014   HCT 40.5 11/15/2014   GLUCOSE 86 11/15/2014   CHOL 212* 11/15/2014   TRIG 181* 11/15/2014   HDL 51 11/15/2014   LDLCALC 125* 11/15/2014   ALT 11 11/15/2014   AST 13 11/15/2014   NA 138 11/15/2014   K 4.8 11/15/2014   CL 101 11/15/2014   CREATININE 0.77 11/15/2014   BUN 15 11/15/2014   CO2 24 11/15/2014   TSH 2.450 11/15/2014    Dg Chest Portable 1 View  06/07/2009   Clinical Data: Shortness of breath and chest pain.   PORTABLE CHEST - 1 VIEW   Comparison: None available.   Findings: The lungs are clear.  Heart size is normal.  No pleural effusion or focal abnormality.   IMPRESSION: No acute disease.  Provider: Mila Palmer   Assessment & Plan:   Diagnoses and all orders for this visit:  Hyperlipemia Orders: -     CMP14+EGFR -     NMR, lipoprofile  Postmenopausal Orders: -     DG Bone Density -     Cancel: POCT CBC -     Vit D  25 hydroxy (rtn osteoporosis monitoring)  Other fatigue Orders: -     Cancel: POCT CBC -     CMP14+EGFR -     Vit D  25 hydroxy (rtn osteoporosis monitoring) -     CBC with Differential/Platelet  Arthritis of right knee  Other specified hypothyroidism Orders: -     TSH -     T4, free   I have discontinued Ms. Yearick's ciprofloxacin. I am also having her maintain her aspirin, fish  oil-omega-3 fatty acids, Vitamin D3, Multiple Minerals-Vitamins (CALCIUM & VIT D3 BONE HEALTH PO), ALPRAZolam, pravastatin, levothyroxine, and HYDROcodone-acetaminophen.  Meds ordered this encounter  Medications  . HYDROcodone-acetaminophen (NORCO/VICODIN) 5-325 MG per tablet    Sig: Take 1 tablet by mouth every 4 (four) hours as needed.    Refill:  0     Follow-up: Return in about 6 months (around 10/05/2015).  [name], M.D.

## 2015-04-06 LAB — CBC WITH DIFFERENTIAL/PLATELET
BASOS ABS: 0 10*3/uL (ref 0.0–0.2)
Basos: 0 %
Eos: 3 %
Eosinophils Absolute: 0.1 10*3/uL (ref 0.0–0.4)
HEMATOCRIT: 39.9 % (ref 34.0–46.6)
HEMOGLOBIN: 13.1 g/dL (ref 11.1–15.9)
Immature Grans (Abs): 0 10*3/uL (ref 0.0–0.1)
Immature Granulocytes: 0 %
LYMPHS ABS: 1.4 10*3/uL (ref 0.7–3.1)
LYMPHS: 29 %
MCH: 30.5 pg (ref 26.6–33.0)
MCHC: 32.8 g/dL (ref 31.5–35.7)
MCV: 93 fL (ref 79–97)
MONOCYTES: 9 %
Monocytes Absolute: 0.4 10*3/uL (ref 0.1–0.9)
Neutrophils Absolute: 2.8 10*3/uL (ref 1.4–7.0)
Neutrophils Relative %: 59 %
Platelets: 293 10*3/uL (ref 150–379)
RBC: 4.3 x10E6/uL (ref 3.77–5.28)
RDW: 14.2 % (ref 12.3–15.4)
WBC: 4.7 10*3/uL (ref 3.4–10.8)

## 2015-04-06 LAB — CMP14+EGFR
ALBUMIN: 4.2 g/dL (ref 3.5–4.7)
ALT: 7 IU/L (ref 0–32)
AST: 13 IU/L (ref 0–40)
Albumin/Globulin Ratio: 1.7 (ref 1.1–2.5)
Alkaline Phosphatase: 58 IU/L (ref 39–117)
BUN / CREAT RATIO: 21 (ref 11–26)
BUN: 15 mg/dL (ref 8–27)
Bilirubin Total: 0.5 mg/dL (ref 0.0–1.2)
CO2: 24 mmol/L (ref 18–29)
CREATININE: 0.73 mg/dL (ref 0.57–1.00)
Calcium: 9.4 mg/dL (ref 8.7–10.3)
Chloride: 102 mmol/L (ref 97–108)
GFR calc non Af Amer: 77 mL/min/{1.73_m2} (ref >59)
GFR, EST AFRICAN AMERICAN: 89 mL/min/{1.73_m2} (ref >59)
Globulin, Total: 2.5 g/dL (ref 1.5–4.5)
Glucose: 86 mg/dL (ref 65–99)
Potassium: 4.7 mmol/L (ref 3.5–5.2)
SODIUM: 140 mmol/L (ref 134–144)
TOTAL PROTEIN: 6.7 g/dL (ref 6.0–8.5)

## 2015-04-06 LAB — VITAMIN D 25 HYDROXY (VIT D DEFICIENCY, FRACTURES): VIT D 25 HYDROXY: 30.3 ng/mL (ref 30.0–100.0)

## 2015-04-06 LAB — NMR, LIPOPROFILE
CHOLESTEROL: 233 mg/dL — AB (ref 100–199)
HDL Cholesterol by NMR: 56 mg/dL (ref >39)
HDL Particle Number: 35.3 umol/L (ref >=30.5)
LDL Particle Number: 1860 nmol/L — ABNORMAL HIGH (ref <1000)
LDL SIZE: 20.3 nm (ref >20.5)
LDL-C: 143 mg/dL — ABNORMAL HIGH (ref 0–99)
LP-IR Score: 56 — ABNORMAL HIGH (ref <=45)
SMALL LDL PARTICLE NUMBER: 1013 nmol/L — AB (ref <=527)
TRIGLYCERIDES BY NMR: 172 mg/dL — AB (ref 0–149)

## 2015-04-07 ENCOUNTER — Other Ambulatory Visit: Payer: Self-pay | Admitting: Family Medicine

## 2015-04-07 ENCOUNTER — Ambulatory Visit: Payer: Medicare Other | Admitting: *Deleted

## 2015-04-07 ENCOUNTER — Encounter: Payer: Self-pay | Admitting: *Deleted

## 2015-04-07 ENCOUNTER — Ambulatory Visit (INDEPENDENT_AMBULATORY_CARE_PROVIDER_SITE_OTHER): Payer: Medicare Other | Admitting: *Deleted

## 2015-04-07 VITALS — BP 125/74 | HR 78 | Ht 62.0 in | Wt 154.0 lb

## 2015-04-07 DIAGNOSIS — Z0000 Encounter for general adult medical examination without abnormal findings: Secondary | ICD-10-CM | POA: Diagnosis not present

## 2015-04-07 LAB — SPECIMEN STATUS REPORT

## 2015-04-07 LAB — T4, FREE: Free T4: 1.36 ng/dL (ref 0.82–1.77)

## 2015-04-07 LAB — TSH: TSH: 3.05 u[IU]/mL (ref 0.450–4.500)

## 2015-04-07 MED ORDER — VITAMIN D (ERGOCALCIFEROL) 1.25 MG (50000 UNIT) PO CAPS: 50000.0000 [IU] | ORAL_CAPSULE | ORAL | Status: DC

## 2015-04-07 NOTE — Progress Notes (Signed)
Subjective:   Megan Chapman is a 81 y.o. female who presents for an Initial Medicare Annual Wellness Visit. Megan Chapman lives at home by herself. She has a daughter and two sons. 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 She underwent a right meniscus repair last month and is currently in physical therapy.   Review of Systems     Cardiac Risk Factors include: advanced age (>62men, >46 women);sedentary lifestyle   Musculoskeletal: Arthritis in both hands. Recent right mensicus repair and partial left knee replacement years ago.   Skin: Lesion excised from right side of nose last week. She has had basal cell carcinoma removed from this area in the past. Biopsy of current lesion has not been reported. She has a bandaid on the area today.     Other systems negative.   Objective:    Today's Vitals   04/07/15 1530  BP: 125/74  Pulse: 78  Height: 5\' 2"  (1.575 m)  Weight: 154 lb (69.854 kg)  BMI      28.17  Current Medications (verified) Outpatient Encounter Prescriptions as of 04/07/2015  Medication Sig  . aspirin 81 MG tablet Take 81 mg by mouth daily.  . Cholecalciferol (VITAMIN D3) 1000 UNITS CAPS Take by mouth.  . fish oil-omega-3 fatty acids 1000 MG capsule Take 2 g by mouth daily.  [name] HYDROcodone-acetaminophen (NORCO/VICODIN) 5-325 MG per tablet Take 1 tablet by mouth every 4 (four) hours as needed.  [name] levothyroxine (SYNTHROID, LEVOTHROID) 25 MCG tablet TAKE 1 TABLET (25 MCG TOTAL) BY MOUTH DAILY BEFORE BREAKFAST.  . Multiple Minerals-Vitamins (CALCIUM & VIT D3 BONE HEALTH PO) Take 1 tablet by mouth daily.  . pravastatin (PRAVACHOL) 20 MG tablet Take 1 tablet (20 mg total) by mouth daily.  . Vitamin D, Ergocalciferol, (DRISDOL) 50000 UNITS CAPS capsule Take 1 capsule (50,000 Units total) by mouth 2 (two) times a week.  . ALPRAZolam (XANAX) 0.5 MG tablet Take 0.5 mg by mouth at bedtime as needed for sleep.    Allergies (verified) Amoxicillin;  Biaxin; Codeine; and Sulfa antibiotics   History: Past Medical History  Diagnosis Date  . Hyperlipidemia   . Migraine   . Thyroid disease     hypothyroid  . Asthma     not treated  . GERD (gastroesophageal reflux disease)     Not treated   Past Surgical History  Procedure Laterality Date  . Partial knee arthroplasty Left   . Abdominal hysterectomy      partial  . Meniscus repair Right     03/11/15   Family History  Problem Relation Age of Onset  . Cancer Mother     colon  . Cancer Father     prostate  . Cancer Brother   . Stroke Brother   . Migraines Brother   . COPD Son   . Migraines Son   . Down syndrome Daughter    Social History   Occupational History  . Not on file.   Social History Main Topics  . Smoking status: Never Smoker   . Smokeless tobacco: Never Used  . Alcohol Use: No  . Drug Use: No  . Sexual Activity: Not on file    Activities of Daily Living In your present state of health, do you have any difficulty performing the following activities: 04/07/2015  Is the patient deaf or have difficulty hearing? N  Hearing N  Vision Y  Difficulty concentrating or making decisions N  Walking or  climbing stairs? N  Doing errands, shopping? N  Preparing Food and eating ? N  Using the Toilet? N  In the past six months, have you accidently leaked urine? N  Do you have problems with loss of bowel control? N  Managing your Medications? N  Managing your Finances? N  Housekeeping or managing your Housekeeping? N    Immunizations and Health Maintenance Immunization History  Administered Date(s) Administered  . Influenza,inj,Quad PF,36+ Mos 11/15/2014  . Pneumococcal Polysaccharide-23 11/30/2002  . Td 05/31/2005   Patient declined Prevnar vaccination.   Patient Care Team: [name], MD as PCP - General (Family Medicine) Levy Sjogren, MD as Referring Physician (Dermatology) Henreitta Leber, MD as Referring Physician (Orthopedic Surgery)    Ray Church, MD as Consulting Physician (Opthalmology)     Right knee surgery by Dr Eden Lathe at [facility] in 03/2015. Eye exam by Dr Binnie Rail in 06/2014. Lesion excision by Dr Rozann Lesches 04/01/15 Assessment:   This is a routine wellness examination for Megan Chapman.   Hearing/Vision screen No hearing or vision deficits noted at visit today.   Dietary issues and exercise activities discussed: Current Exercise Habits:: Exercise is limited by (Patient is currently in physical therapy s/p knee surgery)  Goals    . Exercise 3x per week (30 min per time)     Increase exercise as tolerated      Depression Screen PHQ 2/9 Scores 04/07/2015 11/15/2014 07/13/2014 04/22/2014 03/23/2014 03/09/2014 09/08/2013  PHQ - 2 Score 0 0 1 0 0 0 2  PHQ- 9 Score - - - - - - 4    Fall Risk Fall Risk  04/07/2015 11/15/2014 07/13/2014 04/22/2014 03/23/2014  Falls in the past year? No No No No No  Risk for fall due to : - - - - -    Cognitive Function: MMSE - Mini Mental State Exam 04/07/2015  Orientation to time 5  Orientation to Place 5  Registration 3  Attention/ Calculation 5  Recall 1  Language- name 2 objects 2  Language- repeat 1  Language- follow 3 step command 3  Language- read & follow direction 1  Write a sentence 1  Copy design 1  Total score 28    Screening Tests Health Maintenance  Topic Date Due  . ZOSTAVAX  10/05/2015 (Originally 10/10/1993)  . PNA vac Low Risk Adult (2 of 2 - PCV13) 10/05/2015 (Originally 12/01/2003)  . MAMMOGRAM  05/26/2015  . TETANUS/TDAP  06/01/2015  . INFLUENZA VACCINE  08/01/2015  . PAP SMEAR  09/09/2015  . COLONOSCOPY  01/01/2016  . DEXA SCAN  04/04/2017      Plan:     Keep appointment with Dr Livia Snellen 10/2015. Have report forwarded from eye doctor Schedule mammogram Consider Prevnar, Tdap, and Zostavax  Recent lab results and recommendations were reviewed.  During the course of the visit, Megan Chapman was educated and counseled about the following appropriate screening and  preventive services:   Vaccines to include: Prevnar-declined, Influenza-suggested yearly,Tdap-declined, Zostavax-declined  Electrocardiogram- due at office visit in 09/2016  Cardiovascular disease screening-LIpids done 04/05/15  Colorectal cancer screening-Last colonoscopy 2007  Bone density screening-done 04/05/15  Diabetes screening-glucose testing done at routine office visits  Glaucoma screening-Eye exam due 07/2015. She will have copy of report sent to our office.   Mammography-Due after 05/26/15. Patient will schedule at mobile unit.  Nutrition counseling-Balanced diet to include lean proteins, fruits, and vegetables  Exercise-Continue physical therapy and increase exercise as tolerated  Patient Instructions (the written plan) were given to the  patient.    Chong Sicilian, RN   04/07/2015      I have reviewed and agree with the above AWV documentation.  [name], M.D.

## 2015-04-07 NOTE — Patient Instructions (Signed)
Keep follow up appointment with Dr Livia Snellen in October 2016. Keep appt with eye doctor and have him forward a copy of report to our office.  Consider Prevnar (pneumonia shot) Tdap will be due in 06/2015. Mammogram due after 05/26/15  Increase activity level as tolerated.  Preventive Care for Adults A healthy lifestyle and preventive care can promote health and wellness. Preventive health guidelines for women include the following key practices.  A routine yearly physical is a good way to check with your health care provider about your health and preventive screening. It is a chance to share any concerns and updates on your health and to receive a thorough exam.  Visit your dentist for a routine exam and preventive care every 6 months. Brush your teeth twice a day and floss once a day. Good oral hygiene prevents tooth decay and gum disease.  The frequency of eye exams is based on your age, health, family medical history, use of contact lenses, and other factors. Follow your health care provider's recommendations for frequency of eye exams.  Eat a healthy diet. Foods like vegetables, fruits, whole grains, low-fat dairy products, and lean protein foods contain the nutrients you need without too many calories. Decrease your intake of foods high in solid fats, added sugars, and salt. Eat the right amount of calories for you.Get information about a proper diet from your health care provider, if necessary.  Regular physical exercise is one of the most important things you can do for your health. Most adults should get at least 150 minutes of moderate-intensity exercise (any activity that increases your heart rate and causes you to sweat) each week. In addition, most adults need muscle-strengthening exercises on 2 or more days a week.  Maintain a healthy weight. The body mass index (BMI) is a screening tool to identify possible weight problems. It provides an estimate of body fat based on height and  weight. Your health care provider can find your BMI and can help you achieve or maintain a healthy weight.For adults 20 years and older:  A BMI below 18.5 is considered underweight.  A BMI of 18.5 to 24.9 is normal.  A BMI of 25 to 29.9 is considered overweight.  A BMI of 30 and above is considered obese.  Maintain normal blood lipids and cholesterol levels by exercising and minimizing your intake of saturated fat. Eat a balanced diet with plenty of fruit and vegetables. Blood tests for lipids and cholesterol should begin at age 48 and be repeated every 5 years. If your lipid or cholesterol levels are high, you are over 50, or you are at high risk for heart disease, you may need your cholesterol levels checked more frequently.Ongoing high lipid and cholesterol levels should be treated with medicines if diet and exercise are not working.  If you smoke, find out from your health care provider how to quit. If you do not use tobacco, do not start.  Lung cancer screening is recommended for adults aged 52-80 years who are at high risk for developing lung cancer because of a history of smoking. A yearly low-dose CT scan of the lungs is recommended for people who have at least a 30-pack-year history of smoking and are a current smoker or have quit within the past 15 years. A pack year of smoking is smoking an average of 1 pack of cigarettes a day for 1 year (for example: 1 pack a day for 30 years or 2 packs a day for 15 years). Yearly  screening should continue until the smoker has stopped smoking for at least 15 years. Yearly screening should be stopped for people who develop a health problem that would prevent them from having lung cancer treatment.  If you are pregnant, do not drink alcohol. If you are breastfeeding, be very cautious about drinking alcohol. If you are not pregnant and choose to drink alcohol, do not have more than 1 drink per day. One drink is considered to be 12 ounces (355 mL) of beer,  5 ounces (148 mL) of wine, or 1.5 ounces (44 mL) of liquor.  Avoid use of street drugs. Do not share needles with anyone. Ask for help if you need support or instructions about stopping the use of drugs.  High blood pressure causes heart disease and increases the risk of stroke. Your blood pressure should be checked at least every 1 to 2 years. Ongoing high blood pressure should be treated with medicines if weight loss and exercise do not work.  If you are 93-25 years old, ask your health care provider if you should take aspirin to prevent strokes.  Diabetes screening involves taking a blood sample to check your fasting blood sugar level. This should be done once every 3 years, after age 71, if you are within normal weight and without risk factors for diabetes. Testing should be considered at a younger age or be carried out more frequently if you are overweight and have at least 1 risk factor for diabetes.  Breast cancer screening is essential preventive care for women. You should practice "breast self-awareness." This means understanding the normal appearance and feel of your breasts and may include breast self-examination. Any changes detected, no matter how small, should be reported to a health care provider. Women in their 64s and 30s should have a clinical breast exam (CBE) by a health care provider as part of a regular health exam every 1 to 3 years. After age 48, women should have a CBE every year. Starting at age 60, women should consider having a mammogram (breast X-ray test) every year. Women who have a family history of breast cancer should talk to their health care provider about genetic screening. Women at a high risk of breast cancer should talk to their health care providers about having an MRI and a mammogram every year.  Breast cancer gene (BRCA)-related cancer risk assessment is recommended for women who have family members with BRCA-related cancers. BRCA-related cancers include breast,  ovarian, tubal, and peritoneal cancers. Having family members with these cancers may be associated with an increased risk for harmful changes (mutations) in the breast cancer genes BRCA1 and BRCA2. Results of the assessment will determine the need for genetic counseling and BRCA1 and BRCA2 testing.  Routine pelvic exams to screen for cancer are no longer recommended for nonpregnant women who are considered low risk for cancer of the pelvic organs (ovaries, uterus, and vagina) and who do not have symptoms. Ask your health care provider if a screening pelvic exam is right for you.  If you have had past treatment for cervical cancer or a condition that could lead to cancer, you need Pap tests and screening for cancer for at least 20 years after your treatment. If Pap tests have been discontinued, your risk factors (such as having a new sexual partner) need to be reassessed to determine if screening should be resumed. Some women have medical problems that increase the chance of getting cervical cancer. In these cases, your health care provider may  recommend more frequent screening and Pap tests.  The HPV test is an additional test that may be used for cervical cancer screening. The HPV test looks for the virus that can cause the cell changes on the cervix. The cells collected during the Pap test can be tested for HPV. The HPV test could be used to screen women aged 16 years and older, and should be used in women of any age who have unclear Pap test results. After the age of 54, women should have HPV testing at the same frequency as a Pap test.  Colorectal cancer can be detected and often prevented. Most routine colorectal cancer screening begins at the age of 14 years and continues through age 71 years. However, your health care provider may recommend screening at an earlier age if you have risk factors for colon cancer. On a yearly basis, your health care provider may provide home test kits to check for hidden  blood in the stool. Use of a small camera at the end of a tube, to directly examine the colon (sigmoidoscopy or colonoscopy), can detect the earliest forms of colorectal cancer. Talk to your health care provider about this at age 26, when routine screening begins. Direct exam of the colon should be repeated every 5-10 years through age 19 years, unless early forms of pre-cancerous polyps or small growths are found.  People who are at an increased risk for hepatitis B should be screened for this virus. You are considered at high risk for hepatitis B if:  You were born in a country where hepatitis B occurs often. Talk with your health care provider about which countries are considered high risk.  Your parents were born in a high-risk country and you have not received a shot to protect against hepatitis B (hepatitis B vaccine).  You have HIV or AIDS.  You use needles to inject street drugs.  You live with, or have sex with, someone who has hepatitis B.  You get hemodialysis treatment.  You take certain medicines for conditions like cancer, organ transplantation, and autoimmune conditions.  Hepatitis C blood testing is recommended for all people born from [birth year] through 1965 and any individual with known risks for hepatitis C.  Practice safe sex. Use condoms and avoid high-risk sexual practices to reduce the spread of sexually transmitted infections (STIs). STIs include gonorrhea, chlamydia, syphilis, trichomonas, herpes, HPV, and human immunodeficiency virus (HIV). Herpes, HIV, and HPV are viral illnesses that have no cure. They can result in disability, cancer, and death.  You should be screened for sexually transmitted illnesses (STIs) including gonorrhea and chlamydia if:  You are sexually active and are younger than 24 years.  You are older than 24 years and your health care provider tells you that you are at risk for this type of infection.  Your sexual activity has changed since you  were last screened and you are at an increased risk for chlamydia or gonorrhea. Ask your health care provider if you are at risk.  If you are at risk of being infected with HIV, it is recommended that you take a prescription medicine daily to prevent HIV infection. This is called preexposure prophylaxis (PrEP). You are considered at risk if:  You are a heterosexual woman, are sexually active, and are at increased risk for HIV infection.  You take drugs by injection.  You are sexually active with a partner who has HIV.  Talk with your health care provider about whether you are at high  risk of being infected with HIV. If you choose to begin PrEP, you should first be tested for HIV. You should then be tested every 3 months for as long as you are taking PrEP.  Osteoporosis is a disease in which the bones lose minerals and strength with aging. This can result in serious bone fractures or breaks. The risk of osteoporosis can be identified using a bone density scan. Women ages 14 years and over and women at risk for fractures or osteoporosis should discuss screening with their health care providers. Ask your health care provider whether you should take a calcium supplement or vitamin D to reduce the rate of osteoporosis.  Menopause can be associated with physical symptoms and risks. Hormone replacement therapy is available to decrease symptoms and risks. You should talk to your health care provider about whether hormone replacement therapy is right for you.  Use sunscreen. Apply sunscreen liberally and repeatedly throughout the day. You should seek shade when your shadow is shorter than you. Protect yourself by wearing long sleeves, pants, a wide-brimmed hat, and sunglasses year round, whenever you are outdoors.  Once a month, do a whole body skin exam, using a mirror to look at the skin on your back. Tell your health care provider of new moles, moles that have irregular borders, moles that are larger  than a pencil eraser, or moles that have changed in shape or color.  Stay current with required vaccines (immunizations).  Influenza vaccine. All adults should be immunized every year.  Tetanus, diphtheria, and acellular pertussis (Td, Tdap) vaccine. Pregnant women should receive 1 dose of Tdap vaccine during each pregnancy. The dose should be obtained regardless of the length of time since the last dose. Immunization is preferred during the 27th-36th week of gestation. An adult who has not previously received Tdap or who does not know her vaccine status should receive 1 dose of Tdap. This initial dose should be followed by tetanus and diphtheria toxoids (Td) booster doses every 10 years. Adults with an unknown or incomplete history of completing a 3-dose immunization series with Td-containing vaccines should begin or complete a primary immunization series including a Tdap dose. Adults should receive a Td booster every 10 years.  Varicella vaccine. An adult without evidence of immunity to varicella should receive 2 doses or a second dose if she has previously received 1 dose. Pregnant females who do not have evidence of immunity should receive the first dose after pregnancy. This first dose should be obtained before leaving the health care facility. The second dose should be obtained 4-8 weeks after the first dose.  Human papillomavirus (HPV) vaccine. Females aged 13-26 years who have not received the vaccine previously should obtain the 3-dose series. The vaccine is not recommended for use in pregnant females. However, pregnancy testing is not needed before receiving a dose. If a female is found to be pregnant after receiving a dose, no treatment is needed. In that case, the remaining doses should be delayed until after the pregnancy. Immunization is recommended for any person with an immunocompromised condition through the age of 61 years if she did not get any or all doses earlier. During the 3-dose  series, the second dose should be obtained 4-8 weeks after the first dose. The third dose should be obtained 24 weeks after the first dose and 16 weeks after the second dose.  Zoster vaccine. One dose is recommended for adults aged 56 years or older unless certain conditions are present.  Measles,  mumps, and rubella (MMR) vaccine. Adults born before [birth year] generally are considered immune to measles and mumps. Adults born in [birth year] or later should have 1 or more doses of MMR vaccine unless there is a contraindication to the vaccine or there is laboratory evidence of immunity to each of the three diseases. A routine second dose of MMR vaccine should be obtained at least 28 days after the first dose for students attending postsecondary schools, health care workers, or international travelers. People who received inactivated measles vaccine or an unknown type of measles vaccine during 1963-1967 should receive 2 doses of MMR vaccine. People who received inactivated mumps vaccine or an unknown type of mumps vaccine before 1979 and are at high risk for mumps infection should consider immunization with 2 doses of MMR vaccine. For females of childbearing age, rubella immunity should be determined. If there is no evidence of immunity, females who are not pregnant should be vaccinated. If there is no evidence of immunity, females who are pregnant should delay immunization until after pregnancy. Unvaccinated health care workers born before [birth year] who lack laboratory evidence of measles, mumps, or rubella immunity or laboratory confirmation of disease should consider measles and mumps immunization with 2 doses of MMR vaccine or rubella immunization with 1 dose of MMR vaccine.  Pneumococcal 13-valent conjugate (PCV13) vaccine. When indicated, a person who is uncertain of her immunization history and has no record of immunization should receive the PCV13 vaccine. An adult aged 79 years or older who has certain medical conditions  and has not been previously immunized should receive 1 dose of PCV13 vaccine. This PCV13 should be followed with a dose of pneumococcal polysaccharide (PPSV23) vaccine. The PPSV23 vaccine dose should be obtained at least 8 weeks after the dose of PCV13 vaccine. An adult aged 23 years or older who has certain medical conditions and previously received 1 or more doses of PPSV23 vaccine should receive 1 dose of PCV13. The PCV13 vaccine dose should be obtained 1 or more years after the last PPSV23 vaccine dose.  Pneumococcal polysaccharide (PPSV23) vaccine. When PCV13 is also indicated, PCV13 should be obtained first. All adults aged 63 years and older should be immunized. An adult younger than age 83 years who has certain medical conditions should be immunized. Any person who resides in a nursing home or long-term care facility should be immunized. An adult smoker should be immunized. People with an immunocompromised condition and certain other conditions should receive both PCV13 and PPSV23 vaccines. People with human immunodeficiency virus (HIV) infection should be immunized as soon as possible after diagnosis. Immunization during chemotherapy or radiation therapy should be avoided. Routine use of PPSV23 vaccine is not recommended for American Indians, [place] Natives, or people younger than 65 years unless there are medical conditions that require PPSV23 vaccine. When indicated, people who have unknown immunization and have no record of immunization should receive PPSV23 vaccine. One-time revaccination 5 years after the first dose of PPSV23 is recommended for people aged 19-64 years who have chronic kidney failure, nephrotic syndrome, asplenia, or immunocompromised conditions. People who received 1-2 doses of PPSV23 before age 30 years should receive another dose of PPSV23 vaccine at age 90 years or later if at least 5 years have passed since the previous dose. Doses of PPSV23 are not needed for people immunized  with PPSV23 at or after age 6 years.  Meningococcal vaccine. Adults with asplenia or persistent complement component deficiencies should receive 2 doses of quadrivalent meningococcal conjugate (MenACWY-D) vaccine. The  doses should be obtained at least 2 months apart. Microbiologists working with certain meningococcal bacteria, [place] recruits, people at risk during an outbreak, and people who travel to or live in countries with a high rate of meningitis should be immunized. A first-year college student up through age 56 years who is living in a residence hall should receive a dose if she did not receive a dose on or after her 16th birthday. Adults who have certain high-risk conditions should receive one or more doses of vaccine.  Hepatitis A vaccine. Adults who wish to be protected from this disease, have certain high-risk conditions, work with hepatitis A-infected animals, work in hepatitis A research labs, or travel to or work in countries with a high rate of hepatitis A should be immunized. Adults who were previously unvaccinated and who anticipate close contact with an international adoptee during the first 60 days after arrival in the [place] from a country with a high rate of hepatitis A should be immunized.  Hepatitis B vaccine. Adults who wish to be protected from this disease, have certain high-risk conditions, may be exposed to blood or other infectious body fluids, are household contacts or sex partners of hepatitis B positive people, are clients or workers in certain care facilities, or travel to or work in countries with a high rate of hepatitis B should be immunized.  Haemophilus influenzae type b (Hib) vaccine. A previously unvaccinated person with asplenia or sickle cell disease or having a scheduled splenectomy should receive 1 dose of Hib vaccine. Regardless of previous immunization, a recipient of a hematopoietic stem cell transplant should receive a 3-dose series 6-12 months  after her successful transplant. Hib vaccine is not recommended for adults with HIV infection. Preventive Services / Frequency Ages 77 years and over  Blood pressure check.  Lipid and cholesterol check.** / Every 5 years beginning at age 27 years.  Lung cancer screening. / Every year if you are aged 35-80 years and have a 30-pack-year history of smoking and currently smoke or have quit within the past 15 years. Yearly screening is stopped once you have quit smoking for at least 15 years or develop a health problem that would prevent you from having lung cancer treatment.  Clinical breast exam.** / Every year after age 6 years.  BRCA-related cancer risk assessment.** / For women who have family members with a BRCA-related cancer (breast, ovarian, tubal, or peritoneal cancers).  Mammogram.** / Every year beginning at age 4 years and continuing for as long as you are in good health. Consult with your health care provider.  Pap test.** / Every 3 years starting at age 54 years through age 65 or 53 years with 3 consecutive normal Pap tests. Testing can be stopped between 65 and 70 years with 3 consecutive normal Pap tests and no abnormal Pap or HPV tests in the past 10 years.  HPV screening.** / Every 3 years from ages 68 years through ages 64 or 92 years with a history of 3 consecutive normal Pap tests. Testing can be stopped between 65 and 70 years with 3 consecutive normal Pap tests and no abnormal Pap or HPV tests in the past 10 years.  Fecal occult blood test (FOBT) of stool. / Every year beginning at age 109 years and continuing until age 73 years. You may not need to do this test if you get a colonoscopy every 10 years.  Flexible sigmoidoscopy or colonoscopy.** / Every 5 years for a flexible sigmoidoscopy or every  10 years for a colonoscopy beginning at age 55 years and continuing until age 3 years.  Hepatitis C blood test.** / For all people born from [birth year] through 1963 and any individual  with known risks for hepatitis C.  Osteoporosis screening.** / A one-time screening for women ages 57 years and over and women at risk for fractures or osteoporosis.  Skin self-exam. / Monthly.  Influenza vaccine. / Every year.  Tetanus, diphtheria, and acellular pertussis (Tdap/Td) vaccine.** / 1 dose of Td every 10 years.  Varicella vaccine.** / Consult your health care provider.  Zoster vaccine.** / 1 dose for adults aged 52 years or older.  Pneumococcal 13-valent conjugate (PCV13) vaccine.** / Consult your health care provider.  Pneumococcal polysaccharide (PPSV23) vaccine.** / 1 dose for all adults aged 50 years and older.  Meningococcal vaccine.** / Consult your health care provider.  Hepatitis A vaccine.** / Consult your health care provider.  Hepatitis B vaccine.** / Consult your health care provider.  Haemophilus influenzae type b (Hib) vaccine.** / Consult your health care provider. ** Family history and personal history of risk and conditions may change your health care provider's recommendations. Document Released: 02/12/2002 Document Revised: 05/03/2014 Document Reviewed: 05/14/2011 [facility] Patient Information 2015 [place], [place]. This information is not intended to replace advice given to you by your health care provider. Make sure you discuss any questions you have with your health care provider.

## 2015-04-08 DIAGNOSIS — S83241A Other tear of medial meniscus, current injury, right knee, initial encounter: Secondary | ICD-10-CM | POA: Diagnosis not present

## 2015-04-08 DIAGNOSIS — R262 Difficulty in walking, not elsewhere classified: Secondary | ICD-10-CM | POA: Diagnosis not present

## 2015-04-08 DIAGNOSIS — M1711 Unilateral primary osteoarthritis, right knee: Secondary | ICD-10-CM | POA: Diagnosis not present

## 2015-04-08 DIAGNOSIS — M25561 Pain in right knee: Secondary | ICD-10-CM | POA: Diagnosis not present

## 2015-04-11 NOTE — Addendum Note (Signed)
Addended by: Ilean China on: 04/11/2015 08:53 AM   Modules accepted: Level of Service

## 2015-04-12 DIAGNOSIS — M1711 Unilateral primary osteoarthritis, right knee: Secondary | ICD-10-CM | POA: Diagnosis not present

## 2015-04-12 DIAGNOSIS — S83241A Other tear of medial meniscus, current injury, right knee, initial encounter: Secondary | ICD-10-CM | POA: Diagnosis not present

## 2015-04-12 DIAGNOSIS — M25561 Pain in right knee: Secondary | ICD-10-CM | POA: Diagnosis not present

## 2015-04-12 DIAGNOSIS — R262 Difficulty in walking, not elsewhere classified: Secondary | ICD-10-CM | POA: Diagnosis not present

## 2015-04-14 DIAGNOSIS — M1711 Unilateral primary osteoarthritis, right knee: Secondary | ICD-10-CM | POA: Diagnosis not present

## 2015-04-14 DIAGNOSIS — M25561 Pain in right knee: Secondary | ICD-10-CM | POA: Diagnosis not present

## 2015-04-14 DIAGNOSIS — R262 Difficulty in walking, not elsewhere classified: Secondary | ICD-10-CM | POA: Diagnosis not present

## 2015-04-14 DIAGNOSIS — S83241A Other tear of medial meniscus, current injury, right knee, initial encounter: Secondary | ICD-10-CM | POA: Diagnosis not present

## 2015-04-20 DIAGNOSIS — M25561 Pain in right knee: Secondary | ICD-10-CM | POA: Diagnosis not present

## 2015-04-20 DIAGNOSIS — M1711 Unilateral primary osteoarthritis, right knee: Secondary | ICD-10-CM | POA: Diagnosis not present

## 2015-04-20 DIAGNOSIS — S83241A Other tear of medial meniscus, current injury, right knee, initial encounter: Secondary | ICD-10-CM | POA: Diagnosis not present

## 2015-04-20 DIAGNOSIS — R262 Difficulty in walking, not elsewhere classified: Secondary | ICD-10-CM | POA: Diagnosis not present

## 2015-04-21 DIAGNOSIS — G894 Chronic pain syndrome: Secondary | ICD-10-CM | POA: Diagnosis not present

## 2015-04-21 DIAGNOSIS — Z9889 Other specified postprocedural states: Secondary | ICD-10-CM | POA: Diagnosis not present

## 2015-05-02 ENCOUNTER — Telehealth: Payer: Self-pay | Admitting: Nurse Practitioner

## 2015-05-16 DIAGNOSIS — C44311 Basal cell carcinoma of skin of nose: Secondary | ICD-10-CM | POA: Diagnosis not present

## 2015-06-02 DIAGNOSIS — J45909 Unspecified asthma, uncomplicated: Secondary | ICD-10-CM | POA: Diagnosis not present

## 2015-06-02 DIAGNOSIS — E785 Hyperlipidemia, unspecified: Secondary | ICD-10-CM | POA: Diagnosis not present

## 2015-06-02 DIAGNOSIS — Z800 Family history of malignant neoplasm of digestive organs: Secondary | ICD-10-CM | POA: Diagnosis not present

## 2015-06-02 DIAGNOSIS — Z9889 Other specified postprocedural states: Secondary | ICD-10-CM | POA: Diagnosis not present

## 2015-06-02 DIAGNOSIS — Z801 Family history of malignant neoplasm of trachea, bronchus and lung: Secondary | ICD-10-CM | POA: Diagnosis not present

## 2015-06-02 DIAGNOSIS — M25561 Pain in right knee: Secondary | ICD-10-CM | POA: Diagnosis not present

## 2015-06-02 DIAGNOSIS — Z8042 Family history of malignant neoplasm of prostate: Secondary | ICD-10-CM | POA: Diagnosis not present

## 2015-06-02 DIAGNOSIS — K219 Gastro-esophageal reflux disease without esophagitis: Secondary | ICD-10-CM | POA: Diagnosis not present

## 2015-06-02 DIAGNOSIS — E079 Disorder of thyroid, unspecified: Secondary | ICD-10-CM | POA: Diagnosis not present

## 2015-06-05 ENCOUNTER — Other Ambulatory Visit: Payer: Self-pay | Admitting: Family Medicine

## 2015-06-20 DIAGNOSIS — Z1231 Encounter for screening mammogram for malignant neoplasm of breast: Secondary | ICD-10-CM | POA: Diagnosis not present

## 2015-06-21 DIAGNOSIS — Z1231 Encounter for screening mammogram for malignant neoplasm of breast: Secondary | ICD-10-CM | POA: Diagnosis not present

## 2015-07-01 ENCOUNTER — Encounter: Payer: Self-pay | Admitting: Family Medicine

## 2015-07-19 ENCOUNTER — Other Ambulatory Visit: Payer: Self-pay | Admitting: Family Medicine

## 2015-07-22 ENCOUNTER — Encounter: Payer: Self-pay | Admitting: *Deleted

## 2015-07-25 DIAGNOSIS — L570 Actinic keratosis: Secondary | ICD-10-CM | POA: Diagnosis not present

## 2015-09-01 ENCOUNTER — Encounter: Payer: Self-pay | Admitting: Physician Assistant

## 2015-09-01 ENCOUNTER — Ambulatory Visit (INDEPENDENT_AMBULATORY_CARE_PROVIDER_SITE_OTHER): Payer: Medicare Other | Admitting: Physician Assistant

## 2015-09-01 VITALS — BP 122/68 | HR 83 | Temp 97.8°F | Ht 62.0 in | Wt 155.0 lb

## 2015-09-01 DIAGNOSIS — G4700 Insomnia, unspecified: Secondary | ICD-10-CM

## 2015-09-01 DIAGNOSIS — R399 Unspecified symptoms and signs involving the genitourinary system: Secondary | ICD-10-CM

## 2015-09-01 DIAGNOSIS — N3090 Cystitis, unspecified without hematuria: Secondary | ICD-10-CM | POA: Diagnosis not present

## 2015-09-01 LAB — POCT URINALYSIS DIPSTICK
Bilirubin, UA: NEGATIVE
Blood, UA: NEGATIVE
GLUCOSE UA: NEGATIVE
Ketones, UA: NEGATIVE
NITRITE UA: NEGATIVE
PH UA: 7.5
UROBILINOGEN UA: NEGATIVE

## 2015-09-01 LAB — POCT UA - MICROSCOPIC ONLY
BACTERIA, U MICROSCOPIC: NEGATIVE
Casts, Ur, LPF, POC: NEGATIVE
Crystals, Ur, HPF, POC: NEGATIVE
RBC, urine, microscopic: NEGATIVE
WBC, Ur, HPF, POC: 75.1
Yeast, UA: NEGATIVE

## 2015-09-01 MED ORDER — CIPROFLOXACIN HCL 500 MG PO TABS: 500.0000 mg | ORAL_TABLET | Freq: Two times a day (BID) | ORAL | Status: DC

## 2015-09-01 MED ORDER — ALPRAZOLAM 0.5 MG PO TABS: 0.5000 mg | ORAL_TABLET | Freq: Every evening | ORAL | Status: DC | PRN

## 2015-09-01 NOTE — Progress Notes (Signed)
   Subjective:    Patient ID: Megan Chapman, female    DOB: 02-Nov-1931, 81 y.o.   MRN: [id number]  HPI 81 y/o female presents with c/o painful urination and frequency that started this morning. She has not tried any medications for relief.   She would also like a refill on her xanax that she uses occasionally when she cannot sleep.     Review of Systems  Constitutional: Negative.   HENT: Negative.   Eyes: Negative.   Respiratory: Negative.   Cardiovascular: Negative.   Gastrointestinal: Positive for abdominal pain.  Endocrine: Positive for polyuria.  Genitourinary: Positive for dysuria and frequency. Decreased urine volume: interrupted, slow stream.  Musculoskeletal: Negative.   Skin: Negative.   Allergic/Immunologic: Negative.   Neurological: Negative.   Hematological: Negative.   Psychiatric/Behavioral: Negative.        Objective:   Physical Exam  Constitutional: She is oriented to person, place, and time. She appears well-developed and well-nourished.  Abdominal: There is tenderness.  Neurological: She is alert and oriented to person, place, and time.  Psychiatric: She has a normal mood and affect. Her behavior is normal. Judgment and thought content normal.  Nursing note and vitals reviewed.  Results for orders placed or performed in visit on 09/01/15  POCT UA - Microscopic Only  Result Value Ref Range   WBC, Ur, HPF, POC 75.100    RBC, urine, microscopic neg    Bacteria, U Microscopic neg    Mucus, UA occ    Epithelial cells, urine per micros occ    Crystals, Ur, HPF, POC neg    Casts, Ur, LPF, POC neg    Yeast, UA neg   POCT urinalysis dipstick  Result Value Ref Range   Color, UA gold    Clarity, UA cloudy    Glucose, UA neg    Bilirubin, UA neg    Ketones, UA neg    Spec Grav, UA <=1.005    Blood, UA neg    pH, UA 7.5    Protein, UA 2++    Urobilinogen, UA negative    Nitrite, UA neg    Leukocytes, UA large (3+) (A) Negative            Assessment & Plan:  1. UTI symptoms  - POCT UA - Microscopic Only - POCT urinalysis dipstick - Urine culture - ciprofloxacin (CIPRO) 500 MG tablet; Take 1 tablet (500 mg total) by mouth 2 (two) times daily.  Dispense: 20 tablet; Refill: 0  2. Cystitis  - ciprofloxacin (CIPRO) 500 MG tablet; Take 1 tablet (500 mg total) by mouth 2 (two) times daily.  Dispense: 20 tablet; Refill: 0  3. Insomnia  - ALPRAZolam (XANAX) 0.5 MG tablet; Take 1 tablet (0.5 mg total) by mouth at bedtime as needed for sleep.  Dispense: 30 tablet; Refill: 1   Continue all meds Labs pending Health Maintenance reviewed Diet and exercise encouraged RTO if s/s worsen or do not improve   Mechille Varghese A. Benjamin Stain PA-C

## 2015-09-01 NOTE — Patient Instructions (Signed)

## 2015-09-02 DIAGNOSIS — H3531 Nonexudative age-related macular degeneration: Secondary | ICD-10-CM | POA: Diagnosis not present

## 2015-09-02 DIAGNOSIS — D481 Neoplasm of uncertain behavior of connective and other soft tissue: Secondary | ICD-10-CM | POA: Diagnosis not present

## 2015-09-02 DIAGNOSIS — H527 Unspecified disorder of refraction: Secondary | ICD-10-CM | POA: Diagnosis not present

## 2015-09-03 LAB — URINE CULTURE

## 2015-10-05 ENCOUNTER — Ambulatory Visit: Payer: Medicare Other | Admitting: Family Medicine

## 2015-10-11 ENCOUNTER — Ambulatory Visit (INDEPENDENT_AMBULATORY_CARE_PROVIDER_SITE_OTHER): Payer: Medicare Other | Admitting: Family Medicine

## 2015-10-11 DIAGNOSIS — E038 Other specified hypothyroidism: Secondary | ICD-10-CM | POA: Diagnosis not present

## 2015-10-11 DIAGNOSIS — G4700 Insomnia, unspecified: Secondary | ICD-10-CM | POA: Diagnosis not present

## 2015-10-11 DIAGNOSIS — R5383 Other fatigue: Secondary | ICD-10-CM

## 2015-10-11 DIAGNOSIS — E785 Hyperlipidemia, unspecified: Secondary | ICD-10-CM | POA: Diagnosis not present

## 2015-10-11 DIAGNOSIS — Z139 Encounter for screening, unspecified: Secondary | ICD-10-CM

## 2015-10-11 DIAGNOSIS — E039 Hypothyroidism, unspecified: Secondary | ICD-10-CM | POA: Insufficient documentation

## 2015-10-11 DIAGNOSIS — E559 Vitamin D deficiency, unspecified: Secondary | ICD-10-CM

## 2015-10-11 LAB — POCT URINALYSIS DIPSTICK
Bilirubin, UA: NEGATIVE
Glucose, UA: NEGATIVE
Ketones, UA: NEGATIVE
Nitrite, UA: NEGATIVE
PROTEIN UA: NEGATIVE
SPEC GRAV UA: 1.025
UROBILINOGEN UA: NEGATIVE
pH, UA: 5

## 2015-10-11 LAB — POCT UA - MICROSCOPIC ONLY
CRYSTALS, UR, HPF, POC: NEGATIVE
Casts, Ur, LPF, POC: NEGATIVE
Mucus, UA: NEGATIVE
Yeast, UA: NEGATIVE

## 2015-10-11 MED ORDER — VITAMIN D3 25 MCG (1000 UT) PO CAPS: 2000.0000 [IU] | ORAL_CAPSULE | Freq: Every day | ORAL | Status: AC

## 2015-10-11 MED ORDER — ALPRAZOLAM 0.5 MG PO TABS: 0.5000 mg | ORAL_TABLET | Freq: Every evening | ORAL | Status: DC | PRN

## 2015-10-11 MED ORDER — LEVOTHYROXINE SODIUM 25 MCG PO TABS: ORAL_TABLET | ORAL | Status: DC

## 2015-10-11 MED ORDER — PRAVASTATIN SODIUM 20 MG PO TABS: ORAL_TABLET | ORAL | Status: DC

## 2015-10-11 NOTE — Progress Notes (Signed)
Subjective:  Patient ID: Megan Chapman, female    DOB: 1931-07-27  Age: 81 y.o. MRN: [id number]  CC: Annual Exam   HPI Donasia Corso presents for follow-up of elevated cholesterol. Doing well without complaints on current medication. Denies side effects of statin including myalgia and arthralgia and nausea. Also in today for liver function testing. Currently no chest pain, shortness of breath or other cardiovascular related symptoms noted.  Patient presents for follow-up on  thyroid. She has a history of hypothyroidism for many years. It has been stable recently. Pt. denies any change in  voice, loss of hair, heat or cold intolerance. Energy level has been adequate to good. She denies constipation and diarrhea. No myxedema. Medication is as noted below. Verified that pt is taking it daily on an empty stomach. Well tolerated.  Patient also has been taking vitamin D for deficiency. She is due for recheck of that level today. No symptoms noted. She is currently taking calcium. She is not taking osteoporosis medication otherwise.  Pt. Declines al immunizations. History Chastin has a past medical history of Hyperlipidemia; Migraine; Thyroid disease; Asthma; GERD (gastroesophageal reflux disease); and Basal cell carcinoma.   She has past surgical history that includes Partial knee arthroplasty (Left); Abdominal hysterectomy; and Meniscus repair (Right).   Her family history includes COPD in her son; Cancer in her brother, father, and mother; Down syndrome in her daughter; Migraines in her brother and son; Stroke in her brother.She reports that she has never smoked. She has never used smokeless tobacco. She reports that she does not drink alcohol or use illicit drugs.  Current Outpatient Prescriptions on File Prior to Visit  Medication Sig Dispense Refill  . aspirin 81 MG tablet Take 81 mg by mouth daily.    . fish oil-omega-3 fatty acids 1000 MG capsule Take 2 g by mouth daily.    . Multiple  Minerals-Vitamins (CALCIUM & VIT D3 BONE HEALTH PO) Take 1 tablet by mouth daily.     No current facility-administered medications on file prior to visit.    ROS Review of Systems  Objective:  There were no vitals taken for this visit.  BP Readings from Last 3 Encounters:  09/01/15 122/68  04/07/15 125/74  04/05/15 144/75    Wt Readings from Last 3 Encounters:  09/01/15 155 lb (70.308 kg)  04/07/15 154 lb (69.854 kg)  04/05/15 154 lb (69.854 kg)     Physical Exam  No results found for: HGBA1C  Lab Results  Component Value Date   WBC 4.1 10/11/2015   HGB 13.1 04/05/2015   HCT 41.4 10/11/2015   PLT 293 04/05/2015   GLUCOSE 94 10/11/2015   CHOL 213* 10/11/2015   TRIG 205* 10/11/2015   HDL 50 10/11/2015   LDLCALC 122* 10/11/2015   ALT 14 10/11/2015   AST 16 10/11/2015   NA 141 10/11/2015   K 5.0 10/11/2015   CL 102 10/11/2015   CREATININE 0.72 10/11/2015   BUN 13 10/11/2015   CO2 24 10/11/2015   TSH 5.020* 10/11/2015    Dg Chest Portable 1 View  06/07/2009   Clinical Data: Shortness of breath and chest pain.   PORTABLE CHEST - 1 VIEW   Comparison: None available.   Findings: The lungs are clear.  Heart size is normal.  No pleural effusion or focal abnormality.   IMPRESSION: No acute disease.  Provider: Mila Palmer   Assessment & Plan:   Hermela was seen today for annual exam.  Diagnoses and all  orders for this visit:  Hyperlipemia -     CMP14+EGFR -     Lipid panel -     POCT urinalysis dipstick  Other fatigue -     CBC with Differential/Platelet -     EKG 12-Lead -     POCT urinalysis dipstick -     POCT UA - Microscopic Only -     Urine culture  Screening -     EKG 12-Lead -     POCT urinalysis dipstick  Other specified hypothyroidism -     POCT urinalysis dipstick -     TSH -     T4, Free  Vitamin D deficiency -     POCT urinalysis dipstick -     Vit D  25 hydroxy (rtn osteoporosis monitoring)  Insomnia -     POCT urinalysis  dipstick -     ALPRAZolam (XANAX) 0.5 MG tablet; Take 1 tablet (0.5 mg total) by mouth at bedtime as needed for sleep.  Other orders -     Cholecalciferol (VITAMIN D3) 1000 UNITS CAPS; Take 2 capsules (2,000 Units total) by mouth daily. -     levothyroxine (SYNTHROID, LEVOTHROID) 25 MCG tablet; TAKE 1 TABLET (25 MCG TOTAL) BY MOUTH DAILY BEFORE BREAKFAST. -     Discontinue: pravastatin (PRAVACHOL) 20 MG tablet; TAKE 1 TABLET (20 MG TOTAL) BY MOUTH DAILY.   I have discontinued Ms. Fricker's pravastatin and ciprofloxacin. I have also changed her Vitamin D3. Additionally, I am having her maintain her aspirin, fish oil-omega-3 fatty acids, Multiple Minerals-Vitamins (CALCIUM & VIT D3 BONE HEALTH PO), ALPRAZolam, and levothyroxine.  Meds ordered this encounter  Medications  . ALPRAZolam (XANAX) 0.5 MG tablet    Sig: Take 1 tablet (0.5 mg total) by mouth at bedtime as needed for sleep.    Dispense:  30 tablet    Refill:  1  . Cholecalciferol (VITAMIN D3) 1000 UNITS CAPS    Sig: Take 2 capsules (2,000 Units total) by mouth daily.    Dispense:  30 capsule    Refill:  11  . levothyroxine (SYNTHROID, LEVOTHROID) 25 MCG tablet    Sig: TAKE 1 TABLET (25 MCG TOTAL) BY MOUTH DAILY BEFORE BREAKFAST.    Dispense:  30 tablet    Refill:  9  . DISCONTD: pravastatin (PRAVACHOL) 20 MG tablet    Sig: TAKE 1 TABLET (20 MG TOTAL) BY MOUTH DAILY.    Dispense:  90 tablet    Refill:  3   -Meal plan printed for patient  Follow-up: Return in about 3 months (around 01/11/2016).  [name], M.D.

## 2015-10-11 NOTE — Patient Instructions (Signed)
DASH Eating Plan  DASH stands for "Dietary Approaches to Stop Hypertension." The DASH eating plan is a healthy eating plan that has been shown to reduce high blood pressure (hypertension). Additional health benefits may include reducing the risk of type 2 diabetes mellitus, heart disease, and stroke. The DASH eating plan may also help with weight loss.  WHAT DO I NEED TO KNOW ABOUT THE DASH EATING PLAN?  For the DASH eating plan, you will follow these general guidelines:  · Choose foods with a percent daily value for sodium of less than 5% (as listed on the food label).  · Use salt-free seasonings or herbs instead of table salt or sea salt.  · Check with your health care provider or pharmacist before using salt substitutes.  · Eat lower-sodium products, often labeled as "lower sodium" or "no salt added."  · Eat fresh foods.  · Eat more vegetables, fruits, and low-fat dairy products.  · Choose whole grains. Look for the word "whole" as the first word in the ingredient list.  · Choose fish and skinless chicken or turkey more often than red meat. Limit fish, poultry, and meat to 6 oz (170 g) each day.  · Limit sweets, desserts, sugars, and sugary drinks.  · Choose heart-healthy fats.  · Limit cheese to 1 oz (28 g) per day.  · Eat more home-cooked food and less restaurant, buffet, and fast food.  · Limit fried foods.  · Cook foods using methods other than frying.  · Limit canned vegetables. If you do use them, rinse them well to decrease the sodium.  · When eating at a restaurant, ask that your food be prepared with less salt, or no salt if possible.  WHAT FOODS CAN I EAT?  Seek help from a dietitian for individual calorie needs.  Grains  Whole grain or whole wheat bread. Brown rice. Whole grain or whole wheat pasta. Quinoa, bulgur, and whole grain cereals. Low-sodium cereals. Corn or whole wheat flour tortillas. Whole grain cornbread. Whole grain crackers. Low-sodium crackers.  Vegetables  Fresh or frozen vegetables  (raw, steamed, roasted, or grilled). Low-sodium or reduced-sodium tomato and vegetable juices. Low-sodium or reduced-sodium tomato sauce and paste. Low-sodium or reduced-sodium canned vegetables.   Fruits  All fresh, canned (in natural juice), or frozen fruits.  Meat and Other Protein Products  Ground beef (85% or leaner), grass-fed beef, or beef trimmed of fat. Skinless chicken or turkey. Ground chicken or turkey. Pork trimmed of fat. All fish and seafood. Eggs. Dried beans, peas, or lentils. Unsalted nuts and seeds. Unsalted canned beans.  Dairy  Low-fat dairy products, such as skim or 1% milk, 2% or reduced-fat cheeses, low-fat ricotta or cottage cheese, or plain low-fat yogurt. Low-sodium or reduced-sodium cheeses.  Fats and Oils  Tub margarines without trans fats. Light or reduced-fat mayonnaise and salad dressings (reduced sodium). Avocado. Safflower, olive, or canola oils. Natural peanut or almond butter.  Other  Unsalted popcorn and pretzels.  The items listed above may not be a complete list of recommended foods or beverages. Contact your dietitian for more options.  WHAT FOODS ARE NOT RECOMMENDED?  Grains  White bread. White pasta. White rice. Refined cornbread. Bagels and croissants. Crackers that contain trans fat.  Vegetables  Creamed or fried vegetables. Vegetables in a cheese sauce. Regular canned vegetables. Regular canned tomato sauce and paste. Regular tomato and vegetable juices.  Fruits  Dried fruits. Canned fruit in light or heavy syrup. Fruit juice.  Meat and Other Protein   Products  Fatty cuts of meat. Ribs, chicken wings, bacon, sausage, bologna, salami, chitterlings, fatback, hot dogs, bratwurst, and packaged luncheon meats. Salted nuts and seeds. Canned beans with salt.  Dairy  Whole or 2% milk, cream, half-and-half, and cream cheese. Whole-fat or sweetened yogurt. Full-fat cheeses or blue cheese. Nondairy creamers and whipped toppings. Processed cheese, cheese spreads, or cheese  curds.  Condiments  Onion and garlic salt, seasoned salt, table salt, and sea salt. Canned and packaged gravies. Worcestershire sauce. Tartar sauce. Barbecue sauce. Teriyaki sauce. Soy sauce, including reduced sodium. Steak sauce. Fish sauce. Oyster sauce. Cocktail sauce. Horseradish. Ketchup and mustard. Meat flavorings and tenderizers. Bouillon cubes. Hot sauce. Tabasco sauce. Marinades. Taco seasonings. Relishes.  Fats and Oils  Butter, stick margarine, lard, shortening, ghee, and bacon fat. Coconut, palm kernel, or palm oils. Regular salad dressings.  Other  Pickles and olives. Salted popcorn and pretzels.  The items listed above may not be a complete list of foods and beverages to avoid. Contact your dietitian for more information.  WHERE CAN I FIND MORE INFORMATION?  National Heart, [employer], [employer] Institute: www.nhlbi.nih.gov/health/health-topics/topics/dash/     This information is not intended to replace advice given to you by your health care provider. Make sure you discuss any questions you have with your health care provider.     Document Released: 12/06/2011 Document Revised: 01/07/2015 Document Reviewed: 10/21/2013  Elsevier Interactive Patient Education ©2016 [employer]

## 2015-10-12 LAB — CMP14+EGFR
ALBUMIN: 4.5 g/dL (ref 3.5–4.7)
ALK PHOS: 54 IU/L (ref 39–117)
ALT: 14 IU/L (ref 0–32)
AST: 16 IU/L (ref 0–40)
Albumin/Globulin Ratio: 1.7 (ref 1.1–2.5)
BUN / CREAT RATIO: 18 (ref 11–26)
BUN: 13 mg/dL (ref 8–27)
Bilirubin Total: 0.5 mg/dL (ref 0.0–1.2)
CALCIUM: 9.7 mg/dL (ref 8.7–10.3)
CO2: 24 mmol/L (ref 18–29)
CREATININE: 0.72 mg/dL (ref 0.57–1.00)
Chloride: 102 mmol/L (ref 97–108)
GFR calc Af Amer: 90 mL/min/{1.73_m2} (ref >59)
GFR, EST NON AFRICAN AMERICAN: 78 mL/min/{1.73_m2} (ref >59)
GLUCOSE: 94 mg/dL (ref 65–99)
Globulin, Total: 2.7 g/dL (ref 1.5–4.5)
POTASSIUM: 5 mmol/L (ref 3.5–5.2)
Sodium: 141 mmol/L (ref 134–144)
TOTAL PROTEIN: 7.2 g/dL (ref 6.0–8.5)

## 2015-10-12 LAB — CBC WITH DIFFERENTIAL/PLATELET
BASOS ABS: 0 10*3/uL (ref 0.0–0.2)
Basos: 1 %
EOS (ABSOLUTE): 0.1 10*3/uL (ref 0.0–0.4)
EOS: 3 %
HEMATOCRIT: 41.4 % (ref 34.0–46.6)
HEMOGLOBIN: 13.8 g/dL (ref 11.1–15.9)
IMMATURE GRANS (ABS): 0 10*3/uL (ref 0.0–0.1)
IMMATURE GRANULOCYTES: 0 %
LYMPHS: 39 %
Lymphocytes Absolute: 1.6 10*3/uL (ref 0.7–3.1)
MCH: 30.3 pg (ref 26.6–33.0)
MCHC: 33.3 g/dL (ref 31.5–35.7)
MCV: 91 fL (ref 79–97)
MONOCYTES: 8 %
Monocytes Absolute: 0.3 10*3/uL (ref 0.1–0.9)
NEUTROS PCT: 49 %
Neutrophils Absolute: 2 10*3/uL (ref 1.4–7.0)
Platelets: 272 10*3/uL (ref 150–379)
RBC: 4.56 x10E6/uL (ref 3.77–5.28)
RDW: 14.3 % (ref 12.3–15.4)
WBC: 4.1 10*3/uL (ref 3.4–10.8)

## 2015-10-12 LAB — LIPID PANEL
CHOL/HDL RATIO: 4.3 ratio (ref 0.0–4.4)
Cholesterol, Total: 213 mg/dL — ABNORMAL HIGH (ref 100–199)
HDL: 50 mg/dL (ref >39)
LDL Calculated: 122 mg/dL — ABNORMAL HIGH (ref 0–99)
Triglycerides: 205 mg/dL — ABNORMAL HIGH (ref 0–149)
VLDL Cholesterol Cal: 41 mg/dL — ABNORMAL HIGH (ref 5–40)

## 2015-10-12 LAB — VITAMIN D 25 HYDROXY (VIT D DEFICIENCY, FRACTURES): VIT D 25 HYDROXY: 39.7 ng/mL (ref 30.0–100.0)

## 2015-10-12 LAB — URINE CULTURE

## 2015-10-12 LAB — T4, FREE: FREE T4: 1.18 ng/dL (ref 0.82–1.77)

## 2015-10-12 LAB — TSH: TSH: 5.02 u[IU]/mL — ABNORMAL HIGH (ref 0.450–4.500)

## 2015-10-13 ENCOUNTER — Other Ambulatory Visit: Payer: Medicare Other

## 2015-10-13 ENCOUNTER — Encounter: Payer: Self-pay | Admitting: Family Medicine

## 2015-10-13 ENCOUNTER — Other Ambulatory Visit: Payer: Self-pay | Admitting: *Deleted

## 2015-10-13 DIAGNOSIS — Z1212 Encounter for screening for malignant neoplasm of rectum: Secondary | ICD-10-CM

## 2015-10-13 DIAGNOSIS — E785 Hyperlipidemia, unspecified: Secondary | ICD-10-CM

## 2015-10-13 MED ORDER — ATORVASTATIN CALCIUM 40 MG PO TABS: 40.0000 mg | ORAL_TABLET | Freq: Every day | ORAL | Status: DC

## 2015-10-13 NOTE — Addendum Note (Signed)
Addended by: Earlene Plater on: 10/13/2015 05:51 PM   Modules accepted: Orders

## 2015-10-13 NOTE — Progress Notes (Signed)
Lab only 

## 2015-10-16 LAB — FECAL OCCULT BLOOD, IMMUNOCHEMICAL: FECAL OCCULT BLD: NEGATIVE

## 2016-01-01 HISTORY — PX: BASAL CELL CARCINOMA EXCISION: SHX1214

## 2016-04-10 ENCOUNTER — Ambulatory Visit (INDEPENDENT_AMBULATORY_CARE_PROVIDER_SITE_OTHER): Payer: Medicare Other | Admitting: Family Medicine

## 2016-04-10 ENCOUNTER — Encounter: Payer: Self-pay | Admitting: Family Medicine

## 2016-04-10 VITALS — BP 140/78 | HR 76 | Temp 98.3°F | Ht 62.0 in | Wt 151.4 lb

## 2016-04-10 DIAGNOSIS — E785 Hyperlipidemia, unspecified: Secondary | ICD-10-CM | POA: Diagnosis not present

## 2016-04-10 DIAGNOSIS — E559 Vitamin D deficiency, unspecified: Secondary | ICD-10-CM

## 2016-04-10 DIAGNOSIS — E038 Other specified hypothyroidism: Secondary | ICD-10-CM | POA: Diagnosis not present

## 2016-04-10 NOTE — Progress Notes (Signed)
Subjective:  Patient ID: Megan Chapman, female    DOB: 27-Sep-1931  Age: 82 y.o. MRN: [id number]  CC: Hypothyroidism and Hyperlipidemia   HPI Megan Chapman presents for follow-up of elevated cholesterol. Doing well without complaints on current medication. Denies side effects of statin including myalgia and arthralgia and nausea. Also in today for liver function testing. Currently no chest pain, shortness of breath or other cardiovascular related symptoms noted.  Patient presents for follow-up on  thyroid. The patient has a history of hypothyroidism for many years. It has been stable recently. Pt. denies any change in  voice, loss of hair, heat or cold intolerance. Energy level has been adequate to good. Patient denies constipation and diarrhea. No myxedema. Medication is as noted below. Verified that pt is taking it daily on an empty stomach. Well tolerated.  Megan Chapman 1 year ago. Megan Chapman. Now has occasional swelling, prominent veins History Megan Chapman has a past medical history of Hyperlipidemia; Migraine; Thyroid disease; Asthma; GERD (gastroesophageal reflux disease); and Basal cell carcinoma.   She has past surgical history that includes Partial knee arthroplasty (Left); Abdominal hysterectomy; and Meniscus repair (Right).   Her family history includes COPD in her son; Cancer in her brother, father, and mother; Down syndrome in her daughter; Migraines in her brother and son; Stroke in her brother.She reports that she has never smoked. She has never used smokeless tobacco. She reports that she does not drink alcohol or use illicit drugs.  Current Outpatient Prescriptions on File Prior to Visit  Medication Sig Dispense Refill  . ALPRAZolam (XANAX) 0.5 MG tablet Take 1 tablet (0.5 mg total) by mouth at bedtime as needed for sleep. 30 tablet 1  . aspirin 81 MG tablet Take 81 mg by mouth daily.    [name] atorvastatin (LIPITOR) 40 MG tablet Take 1 tablet (40 mg total) by mouth daily. 90 tablet 1  .  Cholecalciferol (VITAMIN D3) 1000 UNITS CAPS Take 2 capsules (2,000 Units total) by mouth daily. 30 capsule 11  . fish oil-omega-3 fatty acids 1000 MG capsule Take 2 g by mouth daily.    [name] levothyroxine (SYNTHROID, LEVOTHROID) 25 MCG tablet TAKE 1 TABLET (25 MCG TOTAL) BY MOUTH DAILY BEFORE BREAKFAST. 30 tablet 9  . Multiple Minerals-Vitamins (CALCIUM & VIT D3 BONE HEALTH PO) Take 1 tablet by mouth daily.     No current facility-administered medications on file prior to visit.    ROS Review of Systems  Constitutional: Negative for fever, activity change and appetite change.  HENT: Negative for congestion, rhinorrhea and sore throat.   Eyes: Negative for visual disturbance.  Respiratory: Negative for cough and shortness of breath.   Cardiovascular: Negative for chest pain and palpitations.  Gastrointestinal: Negative for nausea, abdominal pain and diarrhea.  Genitourinary: Negative for dysuria.  Musculoskeletal: Negative for myalgias and arthralgias.    Objective:  BP 140/78 mmHg  Pulse 76  Temp(Src) 98.3 F (36.8 C) (Oral)  Ht _0  (1.575 m)  Wt 151 lb 6.4 oz (68.675 kg)  BMI 27.68 kg/m2  SpO2 96%  BP Readings from Last 3 Encounters:  04/10/16 140/78  09/01/15 122/68  04/07/15 125/74    Wt Readings from Last 3 Encounters:  04/10/16 151 lb 6.4 oz (68.675 kg)  09/01/15 155 lb (70.308 kg)  04/07/15 154 lb (69.854 kg)     Physical Exam  Constitutional: She is oriented to person, place, and time. She appears well-developed and well-nourished. No distress.  HENT:  Head: Normocephalic and atraumatic.  Right Ear: External ear normal.  Left Ear: External ear normal.  Nose: Nose normal.  Mouth/Throat: Oropharynx is clear and moist.  Eyes: Conjunctivae and EOM are normal. Pupils are equal, round, and reactive to light.  Neck: Normal range of motion. Neck supple. No thyromegaly present.  Cardiovascular: Normal rate, regular rhythm and normal heart sounds.   No murmur  heard. Pulmonary/Chest: Effort normal and breath sounds normal. No respiratory distress. She has no wheezes. She has no rales.  Abdominal: Soft. Bowel sounds are normal. She exhibits no distension. There is no tenderness.  Musculoskeletal: She exhibits no tenderness.  Lymphadenopathy:    She has no cervical adenopathy.  Neurological: She is alert and oriented to person, place, and time. She has normal reflexes.  Skin: Skin is warm and dry.  Psychiatric: She has a normal mood and affect. Her behavior is normal. Judgment and thought content normal.    No results found for: HGBA1C  Lab Results  Component Value Date   WBC 4.1 10/11/2015   HGB 13.1 04/05/2015   HCT 41.4 10/11/2015   PLT 272 10/11/2015   GLUCOSE 94 10/11/2015   CHOL 213* 10/11/2015   TRIG 205* 10/11/2015   HDL 50 10/11/2015   LDLCALC 122* 10/11/2015   ALT 14 10/11/2015   AST 16 10/11/2015   NA 141 10/11/2015   K 5.0 10/11/2015   CL 102 10/11/2015   CREATININE 0.72 10/11/2015   BUN 13 10/11/2015   CO2 24 10/11/2015   TSH 5.020* 10/11/2015       Assessment & Plan:   Megan Chapman was seen today for hypothyroidism and hyperlipidemia.  Diagnoses and all orders for this visit:  Hyperlipidemia -     CMP14+EGFR -     Lipid panel  Other specified hypothyroidism -     CBC with Differential/Platelet -     CMP14+EGFR -     TSH + free T4  Vitamin D deficiency -     CMP14+EGFR -     Vitamin D 1,25 dihydroxy  I am having Megan Chapman maintain her aspirin, fish oil-omega-3 fatty acids, Multiple Minerals-Vitamins (CALCIUM & VIT D3 BONE HEALTH PO), ALPRAZolam, Vitamin D3, levothyroxine, and atorvastatin.  No orders of the defined types were placed in this encounter.     Follow-up: Return in about 6 months (around 10/10/2016) for CPE, Hypothyroidism, cholesterol.  [name], M.D.

## 2016-04-13 LAB — CMP14+EGFR
ALBUMIN: 4.2 g/dL (ref 3.5–4.7)
ALT: 19 IU/L (ref 0–32)
AST: 20 IU/L (ref 0–40)
Albumin/Globulin Ratio: 1.4 (ref 1.2–2.2)
Alkaline Phosphatase: 66 IU/L (ref 39–117)
BILIRUBIN TOTAL: 0.6 mg/dL (ref 0.0–1.2)
BUN / CREAT RATIO: 16 (ref 12–28)
BUN: 12 mg/dL (ref 8–27)
CALCIUM: 9.9 mg/dL (ref 8.7–10.3)
CHLORIDE: 99 mmol/L (ref 96–106)
CO2: 24 mmol/L (ref 18–29)
CREATININE: 0.75 mg/dL (ref 0.57–1.00)
GFR, EST AFRICAN AMERICAN: 86 mL/min/{1.73_m2} (ref >59)
GFR, EST NON AFRICAN AMERICAN: 74 mL/min/{1.73_m2} (ref >59)
GLUCOSE: 88 mg/dL (ref 65–99)
Globulin, Total: 2.9 g/dL (ref 1.5–4.5)
Potassium: 4.9 mmol/L (ref 3.5–5.2)
Sodium: 140 mmol/L (ref 134–144)
TOTAL PROTEIN: 7.1 g/dL (ref 6.0–8.5)

## 2016-04-13 LAB — CBC WITH DIFFERENTIAL/PLATELET
BASOS: 0 %
Basophils Absolute: 0 10*3/uL (ref 0.0–0.2)
EOS (ABSOLUTE): 0.1 10*3/uL (ref 0.0–0.4)
EOS: 2 %
HEMATOCRIT: 39.4 % (ref 34.0–46.6)
HEMOGLOBIN: 12.9 g/dL (ref 11.1–15.9)
IMMATURE GRANS (ABS): 0 10*3/uL (ref 0.0–0.1)
IMMATURE GRANULOCYTES: 0 %
Lymphocytes Absolute: 1.4 10*3/uL (ref 0.7–3.1)
Lymphs: 37 %
MCH: 29.8 pg (ref 26.6–33.0)
MCHC: 32.7 g/dL (ref 31.5–35.7)
MCV: 91 fL (ref 79–97)
MONOCYTES: 10 %
Monocytes Absolute: 0.4 10*3/uL (ref 0.1–0.9)
NEUTROS PCT: 51 %
Neutrophils Absolute: 1.9 10*3/uL (ref 1.4–7.0)
Platelets: 290 10*3/uL (ref 150–379)
RBC: 4.33 x10E6/uL (ref 3.77–5.28)
RDW: 14.6 % (ref 12.3–15.4)
WBC: 3.8 10*3/uL (ref 3.4–10.8)

## 2016-04-13 LAB — LIPID PANEL
Chol/HDL Ratio: 3.1 ratio units (ref 0.0–4.4)
Cholesterol, Total: 186 mg/dL (ref 100–199)
HDL: 60 mg/dL (ref >39)
LDL CALC: 98 mg/dL (ref 0–99)
Triglycerides: 138 mg/dL (ref 0–149)
VLDL CHOLESTEROL CAL: 28 mg/dL (ref 5–40)

## 2016-04-13 LAB — VITAMIN D 1,25 DIHYDROXY
VITAMIN D 1, 25 (OH) TOTAL: 69 pg/mL
Vitamin D2 1, 25 (OH)2: <10 pg/mL
Vitamin D3 1, 25 (OH)2: 65 pg/mL

## 2016-04-13 LAB — TSH+FREE T4
FREE T4: 1.18 ng/dL (ref 0.82–1.77)
TSH: 3 u[IU]/mL (ref 0.450–4.500)

## 2016-05-10 DIAGNOSIS — Z85828 Personal history of other malignant neoplasm of skin: Secondary | ICD-10-CM | POA: Diagnosis not present

## 2016-05-10 DIAGNOSIS — L570 Actinic keratosis: Secondary | ICD-10-CM | POA: Diagnosis not present

## 2016-05-10 DIAGNOSIS — L820 Inflamed seborrheic keratosis: Secondary | ICD-10-CM | POA: Diagnosis not present

## 2016-05-10 DIAGNOSIS — X32XXXD Exposure to sunlight, subsequent encounter: Secondary | ICD-10-CM | POA: Diagnosis not present

## 2016-05-10 DIAGNOSIS — Z08 Encounter for follow-up examination after completed treatment for malignant neoplasm: Secondary | ICD-10-CM | POA: Diagnosis not present

## 2016-05-31 DIAGNOSIS — H02839 Dermatochalasis of unspecified eye, unspecified eyelid: Secondary | ICD-10-CM | POA: Diagnosis not present

## 2016-05-31 DIAGNOSIS — D481 Neoplasm of uncertain behavior of connective and other soft tissue: Secondary | ICD-10-CM | POA: Diagnosis not present

## 2016-07-20 ENCOUNTER — Other Ambulatory Visit: Payer: Self-pay | Admitting: Family Medicine

## 2016-08-22 ENCOUNTER — Ambulatory Visit (INDEPENDENT_AMBULATORY_CARE_PROVIDER_SITE_OTHER): Payer: Medicare Other | Admitting: Pharmacist

## 2016-08-22 ENCOUNTER — Encounter: Payer: Self-pay | Admitting: Pharmacist

## 2016-08-22 ENCOUNTER — Encounter: Payer: Medicare Other | Admitting: *Deleted

## 2016-08-22 VITALS — BP 130/70 | HR 72 | Ht 62.0 in | Wt 151.0 lb

## 2016-08-22 DIAGNOSIS — Z1231 Encounter for screening mammogram for malignant neoplasm of breast: Secondary | ICD-10-CM | POA: Diagnosis not present

## 2016-08-22 DIAGNOSIS — Z0000 Encounter for general adult medical examination without abnormal findings: Secondary | ICD-10-CM

## 2016-08-22 NOTE — Progress Notes (Signed)
Patient ID: Megan Chapman, female   DOB: 07/08/1931, 82 y.o.   MRN: [id number]    Subjective:   Megan Chapman is a 82 y.o. female who presents for a subsequent Medicare Annual Wellness Visit. Megan Chapman is widowed x 2.  She had 2 daughter (1 is deceased) and 2 sons.  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  Now she is fully retired.  She lives alone and is able to do ADLs.  She remains active in community.  Review of Systems  Review of Systems  Constitutional: Negative.   HENT: Negative.   Eyes: Negative.   Respiratory: Negative.   Cardiovascular: Negative.   Gastrointestinal: Negative.   Genitourinary: Negative.   Musculoskeletal: Positive for joint pain.  Skin: Negative.   Endo/Heme/Allergies: Negative.   Psychiatric/Behavioral: Negative.      Current Medications (verified) Outpatient Encounter Prescriptions as of 08/22/2016  Medication Sig  . ALPRAZolam (XANAX) 0.5 MG tablet Take 1 tablet (0.5 mg total) by mouth at bedtime as needed for sleep.  [name] aspirin 81 MG tablet Take 81 mg by mouth daily.  [name] atorvastatin (LIPITOR) 40 MG tablet TAKE 1 TABLET DAILY (Patient taking differently: 0.5 tablets once daily)  . Cholecalciferol (VITAMIN D3) 1000 UNITS CAPS Take 2 capsules (2,000 Units total) by mouth daily.  [name] levothyroxine (SYNTHROID, LEVOTHROID) 25 MCG tablet TAKE 1 TABLET (25 MCG TOTAL) BY MOUTH DAILY BEFORE BREAKFAST.  . Multiple Minerals-Vitamins (CALCIUM & VIT D3 BONE HEALTH PO) Take 1 tablet by mouth daily.  . Omega-3 Fatty Acids (FISH OIL) 1200 MG CAPS Take 2 capsules by mouth daily.  . [DISCONTINUED] fish oil-omega-3 fatty acids 1000 MG capsule Take 2 g by mouth daily.   No facility-administered encounter medications on file as of 08/22/2016.     Allergies (verified) Amoxicillin; Biaxin [clarithromycin]; Codeine; Nitrofurantoin; and Sulfa antibiotics   History: Past Medical History:  Diagnosis Date    . Anxiety   . Asthma    not treated  . Basal cell carcinoma   . Cataract   . GERD (gastroesophageal reflux disease)    Not treated  . Hyperlipidemia   . Migraine   . Thyroid disease    hypothyroid   Past Surgical History:  Procedure Laterality Date  . ABDOMINAL HYSTERECTOMY     partial  . APPENDECTOMY    . BASAL CELL CARCINOMA EXCISION  2017   nose  . CATARACT EXTRACTION Bilateral   . MENISCUS REPAIR Right    03/11/15  . PARTIAL KNEE ARTHROPLASTY Left   . TONSILLECTOMY     Family History  Problem Relation Age of Onset  . Cancer Mother     colon met to lung  . Cancer Father     prostate - met to bone  . Cancer Brother   . Stroke Brother   . Migraines Brother   . COPD Son   . Migraines Son   . Down syndrome Daughter    Social History   Occupational History  . Not on file.   Social History Main Topics  . Smoking status: Never Smoker  . Smokeless tobacco: Never Used  . Alcohol use No  . Drug use: No  . Sexual activity: No    Do you feel safe at home?  Yes Are there smokers in your home (other than you)? No  Dietary issues and exercise activities: Current Exercise Habits: The patient does not participate in regular exercise  at present, Exercise limited by: None identified  Current Dietary habits:  She only cooks a few days per week but cooks enough for later meals.  She doesn't not fry foods and only eats meat a few times per week.   Objective:    Today's Vitals   08/22/16 1118  BP: 130/70  Pulse: 72  Weight: 151 lb (68.5 kg)  Height: 5\' 2"  (1.575 m)  PainSc: 0-No pain   Body mass index is 27.62 kg/m.  Activities of Daily Living In your present state of health, do you have any difficulty performing the following activities: 08/22/2016  Hearing? N  Vision? N  Difficulty concentrating or making decisions? N  Walking or climbing stairs? N  Dressing or bathing? N  Doing errands, shopping? N  Preparing Food and eating ? Y - new teeth and having  some soreness  Using the Toilet? N  In the past six months, have you accidently leaked urine? N  Do you have problems with loss of bowel control? N  Managing your Medications? N  Managing your Finances? N  Housekeeping or managing your Housekeeping? N  Some recent data might be hidden     Cardiac Risk Factors include: advanced age (>28men, >58 women);dyslipidemia;hypertension;sedentary lifestyle  Depression Screen PHQ 2/9 Scores 08/22/2016 04/10/2016 04/07/2015 11/15/2014  PHQ - 2 Score 1 1 0 0  PHQ- 9 Score - 2 - -     Fall Risk Fall Risk  08/22/2016 04/10/2016 04/07/2015 11/15/2014 07/13/2014  Falls in the past year? No Yes No No No  Number falls in past yr: - 1 - - -  Injury with Fall? - No - - -  Risk for fall due to : - - - - -    Cognitive Function: MMSE - Mini Mental State Exam 08/22/2016 04/07/2015  Orientation to time 5 5  Orientation to Place 5 5  Registration 3 3  Attention/ Calculation 5 5  Recall 2 1  Language- name 2 objects 2 2  Language- repeat 1 1  Language- follow 3 step command 3 3  Language- read & follow direction 1 1  Write a sentence 1 1  Copy design 1 1  Total score 29 28    Immunizations and Health Maintenance Immunization History  Administered Date(s) Administered  . Influenza,inj,Quad PF,36+ Mos 11/15/2014  . Pneumococcal Polysaccharide-23 11/30/2002  . Td 05/31/2005   Health Maintenance Due  Topic Date Due  . 07/31/2005  06/01/2015    Patient Care Team: 08/01/2015, MD as PCP - General (Family Medicine) [name], MD as Referring Physician (Dermatology) Elesa Hacker, MD as Referring Physician (Orthopedic Surgery)  Indicate any recent Medical Services you may have received from other than [facility] providers in the past year (date may be approximate).    Assessment:    Annual Wellness Visit    Screening Tests Health Maintenance  Topic Date Due  . TETANUS/TDAP  06/01/2015  . PNA vac Low Risk Adult (2 of 2 - PCV13)  10/01/2016 (Originally 12/01/2003)  . INFLUENZA VACCINE  10/10/2016 (Originally 07/31/2016)  . ZOSTAVAX  01/01/2017 (Originally 10/10/1993)  . MAMMOGRAM  08/22/2017  . DEXA SCAN  Completed        Plan:   During the course of the visit Megan Chapman was educated and counseled about the following appropriate screening and preventive services:   Vaccines to include Pneumoccal, Influenza, Td, Zostavax - patient is due Prevnar 13 but has mammogram scheduled and cannot wait - scheduled to get in  October along with influenza vaccine.  She declines Zostavax vaccine  Colorectal cancer screening - last FOBT was negative 10/2015.  Last colonoscopy 2007.  Mother has colon cancer at age 71 and lived until age 73.  Will request last record of colonoscopy to see if positive for polyps  Cardiovascular disease screening - last EKG 2016.  Last lipid panel was good - patient taking statin  Diabetes screening - last FBG was WNL and screening is UTD  Bone Denisty / Osteoporosis Screening - last 04/2015.  Has been checked about 5 times in past and DEXA has been normal each time. Unless high risk med or other risk factor changes then I don't see need to recheck DEXA for 5 years.  Mammogram - checked today  Nutrition counseling - Discussed eating a variety of fruits and vegetables.  Eat whole grains and lean proteins.  Advanced Directives - UTD, copy requested  Physical Activity - start regular physical activity - should help with arthritis.  Goal is 30 minutes at least 4 days per week    Patient Instructions (the written plan) were given to the patient.   Cherre Robins, PharmD   08/22/2016

## 2016-08-22 NOTE — Patient Instructions (Addendum)
  Megan Chapman , Thank you for taking time to come for your Medicare Wellness Visit. I appreciate your ongoing commitment to your health goals. Please review the following plan we discussed and let me know if I can assist you in the future.   These are the goals we discussed:  Recommend contact dentist regarding fit of new partials / teeth.  Try to exercise daily - either walking or chair exericse.   Continue t eat a variety of fruits, vegetables and lean proteins. Limit high fat meat and dairy products.  Limit sugar intake   This is a list of the screening recommended for you and due dates:  Health Maintenance  Topic Date Due  . Shingles Vaccine  10/10/1993  . Pneumonia vaccines (2 of 2 - PCV13) 12/01/2003  . Tetanus Vaccine  06/01/2015  . Pap Smear  09/09/2015  . Mammogram  06/20/2016  . Flu Shot  10/10/2016*  . DEXA scan (bone density measurement)  Completed  *Topic was postponed. The date shown is not the original due date.

## 2016-09-17 ENCOUNTER — Other Ambulatory Visit: Payer: Self-pay | Admitting: Family Medicine

## 2016-09-24 DIAGNOSIS — H26492 Other secondary cataract, left eye: Secondary | ICD-10-CM | POA: Diagnosis not present

## 2016-09-24 DIAGNOSIS — D481 Neoplasm of uncertain behavior of connective and other soft tissue: Secondary | ICD-10-CM | POA: Diagnosis not present

## 2016-09-24 DIAGNOSIS — H353121 Nonexudative age-related macular degeneration, left eye, early dry stage: Secondary | ICD-10-CM | POA: Diagnosis not present

## 2016-09-24 DIAGNOSIS — H527 Unspecified disorder of refraction: Secondary | ICD-10-CM | POA: Diagnosis not present

## 2016-10-12 ENCOUNTER — Ambulatory Visit (INDEPENDENT_AMBULATORY_CARE_PROVIDER_SITE_OTHER): Payer: Medicare Other | Admitting: Family Medicine

## 2016-10-12 ENCOUNTER — Encounter: Payer: Self-pay | Admitting: Family Medicine

## 2016-10-12 VITALS — BP 134/85 | HR 70 | Temp 97.9°F | Ht 62.0 in | Wt 151.4 lb

## 2016-10-12 DIAGNOSIS — Z23 Encounter for immunization: Secondary | ICD-10-CM | POA: Diagnosis not present

## 2016-10-12 DIAGNOSIS — E785 Hyperlipidemia, unspecified: Secondary | ICD-10-CM | POA: Diagnosis not present

## 2016-10-12 DIAGNOSIS — E039 Hypothyroidism, unspecified: Secondary | ICD-10-CM

## 2016-10-12 DIAGNOSIS — E782 Mixed hyperlipidemia: Secondary | ICD-10-CM | POA: Diagnosis not present

## 2016-10-12 MED ORDER — ALPRAZOLAM 0.5 MG PO TABS: 0.5000 mg | ORAL_TABLET | Freq: Every evening | ORAL | 1 refills | Status: DC | PRN

## 2016-10-12 NOTE — Progress Notes (Signed)
Subjective:  Patient ID: Megan Chapman, female    DOB: 1931/01/20  Age: 82 y.o. MRN: [id number]  CC: Hyperlipidemia (pt here today for routine follow up and she needs refills on her medications. She has already had her mammogram 2 months ago which was normal.)   HPI Niralya Duford presents for follow-up of elevated cholesterol. Doing well without complaints on current medication. Denies side effects of statin including myalgia and arthralgia and nausea. Also in today for liver function testing. Currently no chest pain, shortness of breath or other cardiovascular related symptoms noted.  Patient presents for follow-up on  thyroid. The patient has a history of hypothyroidism for many years. It has been stable recently. Pt. denies any change in  voice, loss of hair, heat or cold intolerance. Energy level has been adequate to good. Patient denies constipation and diarrhea. No myxedema. Medication is as noted below. Verified that pt is taking it daily on an empty stomach. Well tolerated. History Teesha has a past medical history of Anxiety; Asthma; Basal cell carcinoma; Cataract; GERD (gastroesophageal reflux disease); Hyperlipidemia; Migraine; and Thyroid disease.   She has a past surgical history that includes Partial knee arthroplasty (Left); Abdominal hysterectomy; Meniscus repair (Right); Excision basal cell carcinoma (2017); Cataract extraction (Bilateral); Appendectomy; and Tonsillectomy.   Her family history includes COPD in her son; Cancer in her brother, father, and mother; Down syndrome in her daughter; Migraines in her brother and son; Stroke in her brother.She reports that she has never smoked. She has never used smokeless tobacco. She reports that she does not drink alcohol or use drugs.  Current Outpatient Prescriptions on File Prior to Visit  Medication Sig Dispense Refill  . aspirin 81 MG tablet Take 81 mg by mouth daily.    [name] atorvastatin (LIPITOR) 40 MG tablet TAKE 1 TABLET DAILY  (Patient taking differently: 0.5 tablets once daily) 90 tablet 0  . Cholecalciferol (VITAMIN D3) 1000 UNITS CAPS Take 2 capsules (2,000 Units total) by mouth daily. 30 capsule 11  . levothyroxine (SYNTHROID, LEVOTHROID) 25 MCG tablet TAKE (1) TABLET DAILY BE- FORE BREAKFAST. 30 tablet 6  . Multiple Minerals-Vitamins (CALCIUM & VIT D3 BONE HEALTH PO) Take 1 tablet by mouth daily.    . Omega-3 Fatty Acids (FISH OIL) 1200 MG CAPS Take 2 capsules by mouth daily.     No current facility-administered medications on file prior to visit.     ROS Review of Systems  Constitutional: Negative for activity change, appetite change and fever.  HENT: Negative for congestion, rhinorrhea and sore throat.   Eyes: Negative for visual disturbance.  Respiratory: Negative for cough and shortness of breath.   Cardiovascular: Negative for chest pain and palpitations.  Gastrointestinal: Negative for abdominal pain, diarrhea and nausea.  Genitourinary: Negative for dysuria.  Musculoskeletal: Negative for arthralgias and myalgias.    Objective:  BP 134/85   Pulse 70   Temp 97.9 F (36.6 C) (Oral)   Ht '5\' 2"'  (1.575 m)   Wt 151 lb 6 oz (68.7 kg)   BMI 27.69 kg/m   BP Readings from Last 3 Encounters:  10/12/16 134/85  08/22/16 130/70  04/10/16 140/78    Wt Readings from Last 3 Encounters:  10/12/16 151 lb 6 oz (68.7 kg)  08/22/16 151 lb (68.5 kg)  04/10/16 151 lb 6.4 oz (68.7 kg)     Physical Exam  Constitutional: She is oriented to person, place, and time. She appears well-developed and well-nourished. No distress.  HENT:  Head: Normocephalic and  atraumatic.  Right Ear: External ear normal.  Left Ear: External ear normal.  Nose: Nose normal.  Mouth/Throat: Oropharynx is clear and moist.  Eyes: Conjunctivae and EOM are normal. Pupils are equal, round, and reactive to light.  Neck: Normal range of motion. Neck supple. No thyromegaly present.  Cardiovascular: Normal rate, regular rhythm and  normal heart sounds.   No murmur heard. Pulmonary/Chest: Effort normal and breath sounds normal. No respiratory distress. She has no wheezes. She has no rales.  Abdominal: Soft. Bowel sounds are normal. She exhibits no distension. There is no tenderness.  Lymphadenopathy:    She has no cervical adenopathy.  Neurological: She is alert and oriented to person, place, and time. She has normal reflexes.  Skin: Skin is warm and dry.  Psychiatric: She has a normal mood and affect. Her behavior is normal. Judgment and thought content normal.    No results found for: HGBA1C  Lab Results  Component Value Date   WBC 3.8 04/10/2016   HGB 13.1 04/05/2015   HCT 39.4 04/10/2016   PLT 290 04/10/2016   GLUCOSE 90 10/12/2016   CHOL 190 10/12/2016   TRIG 171 (H) 10/12/2016   HDL 54 10/12/2016   LDLCALC 98 04/10/2016   ALT 14 10/12/2016   AST 16 10/12/2016   NA 138 10/12/2016   K 4.9 10/12/2016   CL 99 10/12/2016   CREATININE 0.73 10/12/2016   BUN 10 10/12/2016   CO2 26 10/12/2016   TSH 3.000 04/10/2016    Dg Chest Portable 1 View  Result Date: 06/07/2009 Clinical Data: Shortness of breath and chest pain.  PORTABLE CHEST - 1 VIEW  Comparison: None available.  Findings: The lungs are clear.  Heart size is normal.  No pleural effusion or focal abnormality.  IMPRESSION: No acute disease. Provider: Mila Palmer   Assessment & Plan:   Tasharra was seen today for hyperlipidemia.  Diagnoses and all orders for this visit:  Acquired hypothyroidism -     TSH + free T4  Hyperlipidemia -     CMP14+EGFR -     NMR, lipoprofile  Encounter for immunization -     Flu Vaccine QUAD 36+ mos IM  Other orders -     ALPRAZolam (XANAX) 0.5 MG tablet; Take 1 tablet (0.5 mg total) by mouth at bedtime as needed for sleep.   I am having Ms. Kraemer maintain her aspirin, Multiple Minerals-Vitamins (CALCIUM & VIT D3 BONE HEALTH PO), Vitamin D3, atorvastatin, Fish Oil, levothyroxine, and ALPRAZolam.  Meds  ordered this encounter  Medications  . ALPRAZolam (XANAX) 0.5 MG tablet    Sig: Take 1 tablet (0.5 mg total) by mouth at bedtime as needed for sleep.    Dispense:  30 tablet    Refill:  1     Follow-up: Return in about 6 months (around 04/12/2017).  [name], M.D.

## 2016-10-13 LAB — CMP14+EGFR
A/G RATIO: 1.6 (ref 1.2–2.2)
ALBUMIN: 4.1 g/dL (ref 3.5–4.7)
ALT: 14 IU/L (ref 0–32)
AST: 16 IU/L (ref 0–40)
Alkaline Phosphatase: 64 IU/L (ref 39–117)
BUN/Creatinine Ratio: 14 (ref 12–28)
BUN: 10 mg/dL (ref 8–27)
Bilirubin Total: 0.5 mg/dL (ref 0.0–1.2)
CALCIUM: 9.6 mg/dL (ref 8.7–10.3)
CO2: 26 mmol/L (ref 18–29)
Chloride: 99 mmol/L (ref 96–106)
Creatinine, Ser: 0.73 mg/dL (ref 0.57–1.00)
GFR calc non Af Amer: 76 mL/min/{1.73_m2} (ref >59)
GFR, EST AFRICAN AMERICAN: 88 mL/min/{1.73_m2} (ref >59)
Globulin, Total: 2.6 g/dL (ref 1.5–4.5)
Glucose: 90 mg/dL (ref 65–99)
POTASSIUM: 4.9 mmol/L (ref 3.5–5.2)
Sodium: 138 mmol/L (ref 134–144)
TOTAL PROTEIN: 6.7 g/dL (ref 6.0–8.5)

## 2016-10-13 LAB — NMR, LIPOPROFILE
Cholesterol: 190 mg/dL (ref 100–199)
HDL CHOLESTEROL BY NMR: 54 mg/dL (ref >39)
HDL PARTICLE NUMBER: 30.1 umol/L — AB (ref >=30.5)
LDL Particle Number: 1155 nmol/L — ABNORMAL HIGH (ref <1000)
LDL Size: 20.6 nm (ref >20.5)
LDL-C: 102 mg/dL — ABNORMAL HIGH (ref 0–99)
LP-IR Score: 45 (ref <=45)
SMALL LDL PARTICLE NUMBER: 407 nmol/L (ref <=527)
TRIGLYCERIDES BY NMR: 171 mg/dL — AB (ref 0–149)

## 2016-12-14 ENCOUNTER — Other Ambulatory Visit: Payer: Self-pay | Admitting: Family Medicine

## 2016-12-14 NOTE — Telephone Encounter (Signed)
Last filled 11/15/16, last seen 10/12/16. Call in at

## 2016-12-18 NOTE — Telephone Encounter (Signed)
Refill called to [facility] 

## 2017-01-03 DIAGNOSIS — L820 Inflamed seborrheic keratosis: Secondary | ICD-10-CM | POA: Diagnosis not present

## 2017-01-03 DIAGNOSIS — D225 Melanocytic nevi of trunk: Secondary | ICD-10-CM | POA: Diagnosis not present

## 2017-03-15 ENCOUNTER — Other Ambulatory Visit: Payer: Self-pay | Admitting: Family Medicine

## 2017-04-09 ENCOUNTER — Telehealth: Payer: Self-pay | Admitting: Family Medicine

## 2017-04-09 ENCOUNTER — Other Ambulatory Visit: Payer: Self-pay | Admitting: Family Medicine

## 2017-04-10 ENCOUNTER — Other Ambulatory Visit: Payer: Self-pay | Admitting: Family Medicine

## 2017-04-10 DIAGNOSIS — E782 Mixed hyperlipidemia: Secondary | ICD-10-CM

## 2017-04-10 DIAGNOSIS — E038 Other specified hypothyroidism: Secondary | ICD-10-CM

## 2017-04-10 NOTE — Telephone Encounter (Signed)
Please place orders

## 2017-04-12 ENCOUNTER — Ambulatory Visit: Payer: Medicare Other | Admitting: Family Medicine

## 2017-04-17 ENCOUNTER — Ambulatory Visit (INDEPENDENT_AMBULATORY_CARE_PROVIDER_SITE_OTHER): Payer: Medicare Other | Admitting: Family Medicine

## 2017-04-17 ENCOUNTER — Encounter: Payer: Self-pay | Admitting: Family Medicine

## 2017-04-17 VITALS — BP 130/67 | HR 76 | Temp 98.0°F | Ht 62.0 in | Wt 148.0 lb

## 2017-04-17 DIAGNOSIS — M150 Primary generalized (osteo)arthritis: Secondary | ICD-10-CM | POA: Diagnosis not present

## 2017-04-17 DIAGNOSIS — E559 Vitamin D deficiency, unspecified: Secondary | ICD-10-CM | POA: Diagnosis not present

## 2017-04-17 DIAGNOSIS — E038 Other specified hypothyroidism: Secondary | ICD-10-CM | POA: Diagnosis not present

## 2017-04-17 DIAGNOSIS — M1990 Unspecified osteoarthritis, unspecified site: Secondary | ICD-10-CM | POA: Insufficient documentation

## 2017-04-17 DIAGNOSIS — E782 Mixed hyperlipidemia: Secondary | ICD-10-CM

## 2017-04-17 DIAGNOSIS — M159 Polyosteoarthritis, unspecified: Secondary | ICD-10-CM

## 2017-04-17 MED ORDER — ALPRAZOLAM 0.5 MG PO TABS: 0.5000 mg | ORAL_TABLET | Freq: Every evening | ORAL | 3 refills | Status: DC | PRN

## 2017-04-17 MED ORDER — LEVOTHYROXINE SODIUM 25 MCG PO TABS: ORAL_TABLET | ORAL | 3 refills | Status: DC

## 2017-04-17 MED ORDER — ATORVASTATIN CALCIUM 40 MG PO TABS: 40.0000 mg | ORAL_TABLET | Freq: Every day | ORAL | 3 refills | Status: DC

## 2017-04-17 NOTE — Addendum Note (Signed)
Addended by: Liliane Bade on: 04/17/2017 04:45 PM   Modules accepted: Orders

## 2017-04-17 NOTE — Progress Notes (Signed)
Subjective:  Patient ID: Megan Chapman, female    DOB: 12-28-1931  Age: 83 y.o. MRN: [id number]  CC: Hypothyroidism (pt here today for routine follow up on her chronic medical conditions. No other concerns voiced.)   HPI Megan Chapman presents for follow-up of elevated cholesterol. Doing well without complaints on current medication. Denies side effects of statin including myalgia and arthralgia and nausea. Also in today for liver function testing. Currently no chest pain, shortness of breath or other cardiovascular related symptoms noted.  Patient presents for follow-up on  thyroid. The patient has a history of hypothyroidism for many years. It has been stable recently. Pt. denies any change in  voice, loss of hair, heat or cold intolerance. Energy level has been adequate to good. Patient denies constipation and diarrhea. No myxedema. Medication is as noted below. Verified that pt is taking it daily on an empty stomach. Well tolerated.  Arthritis getting worse. Worst aat feet. Prefers to tx only with tylenol  Anxiety under good control   History Megan Chapman has a past medical history of Anxiety; Asthma; Basal cell carcinoma; Cataract; GERD (gastroesophageal reflux disease); Hyperlipidemia; Migraine; and Thyroid disease.   She has a past surgical history that includes Partial knee arthroplasty (Left); Abdominal hysterectomy; Meniscus repair (Right); Excision basal cell carcinoma (2017); Cataract extraction (Bilateral); Appendectomy; and Tonsillectomy.   Her family history includes COPD in her son; Cancer in her brother, father, and mother; Down syndrome in her daughter; Migraines in her brother and son; Stroke in her brother.She reports that she has never smoked. She has never used smokeless tobacco. She reports that she does not drink alcohol or use drugs.  Current Outpatient Prescriptions on File Prior to Visit  Medication Sig Dispense Refill  . aspirin 81 MG tablet Take 81 mg by mouth daily.      . Cholecalciferol (VITAMIN D3) 1000 UNITS CAPS Take 2 capsules (2,000 Units total) by mouth daily. 30 capsule 11  . Multiple Minerals-Vitamins (CALCIUM & VIT D3 BONE HEALTH PO) Take 1 tablet by mouth daily.    . Omega-3 Fatty Acids (FISH OIL) 1200 MG CAPS Take 2 capsules by mouth daily.     No current facility-administered medications on file prior to visit.     ROS Review of Systems  Constitutional: Negative for activity change, appetite change and fever.  HENT: Negative for congestion, rhinorrhea and sore throat.   Eyes: Negative for visual disturbance.  Respiratory: Negative for cough and shortness of breath.   Cardiovascular: Negative for chest pain and palpitations.  Gastrointestinal: Negative for abdominal pain, diarrhea and nausea.  Genitourinary: Negative for dysuria.  Musculoskeletal: Negative for arthralgias and myalgias.  Psychiatric/Behavioral: Negative for agitation, confusion and decreased concentration. The patient is nervous/anxious (occasional. Works to take alprazolam 2-3 X per week. Occ. more or less).     Objective:  BP 130/67   Pulse 76   Temp 98 F (36.7 C) (Oral)   Ht 5\' 2"  (1.575 m)   Wt 148 lb (67.1 kg)   BMI 27.07 kg/m   BP Readings from Last 3 Encounters:  04/17/17 130/67  10/12/16 134/85  08/22/16 130/70    Wt Readings from Last 3 Encounters:  04/17/17 148 lb (67.1 kg)  10/12/16 151 lb 6 oz (68.7 kg)  08/22/16 151 lb (68.5 kg)     Physical Exam  Constitutional: She is oriented to person, place, and time. She appears well-developed and well-nourished. No distress.  HENT:  Head: Normocephalic and atraumatic.  Eyes: Conjunctivae are  normal. Pupils are equal, round, and reactive to light.  Neck: Normal range of motion. Neck supple. No thyromegaly present.  Cardiovascular: Normal rate, regular rhythm and normal heart sounds.   No murmur heard. Pulmonary/Chest: Effort normal and breath sounds normal. No respiratory distress. She has no  wheezes. She has no rales.  Abdominal: Soft. Bowel sounds are normal. She exhibits no distension. There is no tenderness.  Musculoskeletal: Normal range of motion.  Lymphadenopathy:    She has no cervical adenopathy.  Neurological: She is alert and oriented to person, place, and time.  Skin: Skin is warm and dry.  Psychiatric: She has a normal mood and affect. Her behavior is normal. Judgment and thought content normal.    No results found for: HGBA1C  Lab Results  Component Value Date   WBC 3.8 04/10/2016   HGB 13.1 04/05/2015   HCT 39.4 04/10/2016   PLT 290 04/10/2016   GLUCOSE 90 10/12/2016   CHOL 190 10/12/2016   TRIG 171 (H) 10/12/2016   HDL 54 10/12/2016   LDLCALC 98 04/10/2016   ALT 14 10/12/2016   AST 16 10/12/2016   NA 138 10/12/2016   K 4.9 10/12/2016   CL 99 10/12/2016   CREATININE 0.73 10/12/2016   BUN 10 10/12/2016   CO2 26 10/12/2016   TSH 3.000 04/10/2016    Dg Chest Portable 1 View  Result Date: 06/07/2009 Clinical Data: Shortness of breath and chest pain.  PORTABLE CHEST - 1 VIEW  Comparison: None available.  Findings: The lungs are clear.  Heart size is normal.  No pleural effusion or focal abnormality.  IMPRESSION: No acute disease. Provider: Mila Chapman   Assessment & Plan:   Megan Chapman was seen today for hypothyroidism.  Diagnoses and all orders for this visit:  Mixed hyperlipidemia  Other specified hypothyroidism  Vitamin D deficiency  Primary osteoarthritis involving multiple joints  Other orders -     ALPRAZolam (XANAX) 0.5 MG tablet; Take 1 tablet (0.5 mg total) by mouth at bedtime as needed. for sleep -     atorvastatin (LIPITOR) 40 MG tablet; Take 1 tablet (40 mg total) by mouth daily. -     levothyroxine (SYNTHROID, LEVOTHROID) 25 MCG tablet; TAKE (1) TABLET DAILY BE- FORE BREAKFAST.   I have changed Megan Chapman's ALPRAZolam and atorvastatin. I am also having her maintain her aspirin, Multiple Minerals-Vitamins (CALCIUM & VIT D3 BONE  HEALTH PO), Vitamin D3, Fish Oil, and levothyroxine.  Meds ordered this encounter  Medications  . ALPRAZolam (XANAX) 0.5 MG tablet    Sig: Take 1 tablet (0.5 mg total) by mouth at bedtime as needed. for sleep    Dispense:  30 tablet    Refill:  3  . atorvastatin (LIPITOR) 40 MG tablet    Sig: Take 1 tablet (40 mg total) by mouth daily.    Dispense:  90 tablet    Refill:  3  . levothyroxine (SYNTHROID, LEVOTHROID) 25 MCG tablet    Sig: TAKE (1) TABLET DAILY BE- FORE BREAKFAST.    Dispense:  90 tablet    Refill:  3    Needs to be seen for future refills     Follow-up: Return in about 6 months (around 10/17/2017).  [name], M.D.

## 2017-04-18 LAB — CBC WITH DIFFERENTIAL/PLATELET
BASOS: 1 %
Basophils Absolute: 0 10*3/uL (ref 0.0–0.2)
EOS (ABSOLUTE): 0.1 10*3/uL (ref 0.0–0.4)
Eos: 4 %
HEMATOCRIT: 38.6 % (ref 34.0–46.6)
Hemoglobin: 12.8 g/dL (ref 11.1–15.9)
IMMATURE GRANS (ABS): 0 10*3/uL (ref 0.0–0.1)
IMMATURE GRANULOCYTES: 0 %
LYMPHS: 44 %
Lymphocytes Absolute: 1.5 10*3/uL (ref 0.7–3.1)
MCH: 29.7 pg (ref 26.6–33.0)
MCHC: 33.2 g/dL (ref 31.5–35.7)
MCV: 90 fL (ref 79–97)
Monocytes Absolute: 0.3 10*3/uL (ref 0.1–0.9)
Monocytes: 9 %
NEUTROS PCT: 42 %
Neutrophils Absolute: 1.4 10*3/uL (ref 1.4–7.0)
Platelets: 255 10*3/uL (ref 150–379)
RBC: 4.31 x10E6/uL (ref 3.77–5.28)
RDW: 14.4 % (ref 12.3–15.4)
WBC: 3.3 10*3/uL — ABNORMAL LOW (ref 3.4–10.8)

## 2017-04-18 LAB — CMP14+EGFR
A/G RATIO: 1.5 (ref 1.2–2.2)
ALT: 17 IU/L (ref 0–32)
AST: 14 IU/L (ref 0–40)
Albumin: 4.2 g/dL (ref 3.5–4.7)
Alkaline Phosphatase: 57 IU/L (ref 39–117)
BUN/Creatinine Ratio: 22 (ref 12–28)
BUN: 16 mg/dL (ref 8–27)
Bilirubin Total: 0.4 mg/dL (ref 0.0–1.2)
CALCIUM: 9.5 mg/dL (ref 8.7–10.3)
CO2: 25 mmol/L (ref 18–29)
Chloride: 100 mmol/L (ref 96–106)
Creatinine, Ser: 0.74 mg/dL (ref 0.57–1.00)
GFR, EST AFRICAN AMERICAN: 87 mL/min/{1.73_m2} (ref >59)
GFR, EST NON AFRICAN AMERICAN: 75 mL/min/{1.73_m2} (ref >59)
GLOBULIN, TOTAL: 2.8 g/dL (ref 1.5–4.5)
Glucose: 83 mg/dL (ref 65–99)
POTASSIUM: 4.5 mmol/L (ref 3.5–5.2)
SODIUM: 141 mmol/L (ref 134–144)
TOTAL PROTEIN: 7 g/dL (ref 6.0–8.5)

## 2017-04-18 LAB — LIPID PANEL
CHOL/HDL RATIO: 3.3 ratio (ref 0.0–4.4)
Cholesterol, Total: 194 mg/dL (ref 100–199)
HDL: 58 mg/dL (ref >39)
LDL CALC: 107 mg/dL — AB (ref 0–99)
Triglycerides: 147 mg/dL (ref 0–149)
VLDL Cholesterol Cal: 29 mg/dL (ref 5–40)

## 2017-04-18 LAB — TSH+FREE T4
Free T4: 1.19 ng/dL (ref 0.82–1.77)
TSH: 4.6 u[IU]/mL — AB (ref 0.450–4.500)

## 2017-05-15 ENCOUNTER — Other Ambulatory Visit: Payer: Self-pay | Admitting: Family Medicine

## 2017-07-08 ENCOUNTER — Other Ambulatory Visit: Payer: Self-pay | Admitting: Family Medicine

## 2017-07-11 DIAGNOSIS — Z08 Encounter for follow-up examination after completed treatment for malignant neoplasm: Secondary | ICD-10-CM | POA: Diagnosis not present

## 2017-07-11 DIAGNOSIS — L570 Actinic keratosis: Secondary | ICD-10-CM | POA: Diagnosis not present

## 2017-07-11 DIAGNOSIS — X32XXXD Exposure to sunlight, subsequent encounter: Secondary | ICD-10-CM | POA: Diagnosis not present

## 2017-07-11 DIAGNOSIS — D225 Melanocytic nevi of trunk: Secondary | ICD-10-CM | POA: Diagnosis not present

## 2017-07-11 DIAGNOSIS — Z85828 Personal history of other malignant neoplasm of skin: Secondary | ICD-10-CM | POA: Diagnosis not present

## 2017-07-11 DIAGNOSIS — L820 Inflamed seborrheic keratosis: Secondary | ICD-10-CM | POA: Diagnosis not present

## 2017-10-14 ENCOUNTER — Ambulatory Visit (INDEPENDENT_AMBULATORY_CARE_PROVIDER_SITE_OTHER): Payer: Medicare Other | Admitting: Family Medicine

## 2017-10-14 ENCOUNTER — Encounter: Payer: Self-pay | Admitting: Family Medicine

## 2017-10-14 ENCOUNTER — Ambulatory Visit (INDEPENDENT_AMBULATORY_CARE_PROVIDER_SITE_OTHER): Payer: Medicare Other

## 2017-10-14 VITALS — BP 159/80 | HR 76 | Temp 97.3°F | Ht 62.0 in | Wt 148.0 lb

## 2017-10-14 DIAGNOSIS — Z23 Encounter for immunization: Secondary | ICD-10-CM

## 2017-10-14 DIAGNOSIS — M545 Low back pain, unspecified: Secondary | ICD-10-CM

## 2017-10-14 DIAGNOSIS — M150 Primary generalized (osteo)arthritis: Secondary | ICD-10-CM | POA: Diagnosis not present

## 2017-10-14 DIAGNOSIS — E039 Hypothyroidism, unspecified: Secondary | ICD-10-CM | POA: Diagnosis not present

## 2017-10-14 DIAGNOSIS — E782 Mixed hyperlipidemia: Secondary | ICD-10-CM

## 2017-10-14 DIAGNOSIS — M159 Polyosteoarthritis, unspecified: Secondary | ICD-10-CM

## 2017-10-14 MED ORDER — TRAMADOL HCL 50 MG PO TABS: 50.0000 mg | ORAL_TABLET | Freq: Four times a day (QID) | ORAL | 0 refills | Status: DC

## 2017-10-14 MED ORDER — CYCLOBENZAPRINE HCL 5 MG PO TABS: 5.0000 mg | ORAL_TABLET | Freq: Three times a day (TID) | ORAL | 0 refills | Status: DC | PRN

## 2017-10-14 MED ORDER — BETAMETHASONE SOD PHOS & ACET 6 (3-3) MG/ML IJ SUSP
6.0000 mg | Freq: Once | INTRAMUSCULAR | Status: AC
  Administered 2017-10-14: 6 mg via INTRAMUSCULAR

## 2017-10-14 MED ORDER — ALPRAZOLAM 0.5 MG PO TABS
0.5000 mg | ORAL_TABLET | Freq: Every evening | ORAL | 5 refills | Status: DC | PRN
Start: 2017-10-14 — End: 2018-10-14

## 2017-10-14 NOTE — Patient Instructions (Signed)

## 2017-10-14 NOTE — Progress Notes (Signed)
Chief Complaint  Patient presents with  . Back Pain    pt here today c/o lower back pain on and off for the past 5-6 weeks but in the past week it has gotten much worse and now has shooting pains down the right leg    HPI 5-6 weeks of lower back pain. Points to the right SI area but it is radiating down the right leg. Pain is 7-8/10. Shooting pains when they occur can be even worse. It is very hard for her to sit although she can stand okay. After a while though it hurts to even stand. There is no known injury. The pain started off mild but has started crescendo over the last for 5 days.  Patient in for follow-up of elevated cholesterol. Doing well without complaints on current medication. Denies side effects of statin including myalgia and arthralgia and nausea. Also in today for liver function testing. Currently no chest pain, shortness of breath or other cardiovascular related symptoms noted.   Patient presents for follow-up on  thyroid. The patient has a history of hypothyroidism for many years. It has been stable recently. Pt. denies any change in  voice, loss of hair, heat or cold intolerance. Energy level has been adequate to good. Patient denies constipation and diarrhea. No myxedema. Medication is as noted below. Verified that pt is taking it daily on an empty stomach. Well tolerated.  Depression screen [facility] 2/9 10/14/2017 04/17/2017 10/12/2016  Decreased Interest 0 0 0  Down, Depressed, Hopeless 0 0 1  PHQ - 2 Score 0 0 1  Altered sleeping - - -  Tired, decreased energy - - -  Change in appetite - - -  Feeling bad or failure about yourself  - - -  Trouble concentrating - - -  Moving slowly or fidgety/restless - - -  Suicidal thoughts - - -  PHQ-9 Score - - -  Difficult doing work/chores - - -   Sleeping well. Taking xanax most nights. Sleeps better when she does. Helps with anxiety at bvedtime as well.   PMH: Smoking status noted ROS: Review of Systems  Constitutional:  Positive for activity change. Negative for appetite change and fever.  HENT: Negative for congestion, rhinorrhea and sore throat.   Eyes: Negative for visual disturbance.  Respiratory: Negative for cough and shortness of breath.   Cardiovascular: Negative for chest pain and palpitations.  Gastrointestinal: Negative for abdominal pain, diarrhea and nausea.  Genitourinary: Negative for dysuria.  Musculoskeletal: Positive for arthralgias, back pain and myalgias.  Psychiatric/Behavioral: Positive for sleep disturbance. Negative for agitation and dysphoric mood. The patient is nervous/anxious.     Objective: BP (!) 159/80   Pulse 76   Temp (!) 97.3 F (36.3 C) (Oral)   Ht '5\' 2"'  (1.575 m)   Wt 148 lb (67.1 kg)   BMI 27.07 kg/m  Gen: NAD, alert, cooperative with exam HEENT: NCAT, Nasal passages swollen, red TMS RED CV: RRR, good S1/S2, no murmur Resp: Bronchitis changes with scattered wheezes, non-labored Ext: No edema, warm. Tender over the superior aspect of the right SI joint and radiating into the mid buttocks and down the left posterior leg. Neuro: Alert and oriented, No gross deficits  Lumbar spine x-ray: There is mild levoscoliosis as well as moderately severe degenerative joint and degenerative disc disease. There is no evidence for compression fracture. Minimal anterolisthesis of L3 on L4 Assessment and plan:  1. Lumbar back pain   2. Mixed hyperlipidemia   3. Acquired  hypothyroidism   4. Encounter for immunization   5. Primary osteoarthritis involving multiple joints     Meds ordered this encounter  Medications  . betamethasone acetate-betamethasone sodium phosphate (CELESTONE) injection 6 mg  . cyclobenzaprine (FLEXERIL) 5 MG tablet    Sig: Take 1 tablet (5 mg total) by mouth 3 (three) times daily as needed for muscle spasms.    Dispense:  90 tablet    Refill:  0  . traMADol (ULTRAM) 50 MG tablet    Sig: Take 1 tablet (50 mg total) by mouth 4 (four) times daily.     Dispense:  28 tablet    Refill:  0  . ALPRAZolam (XANAX) 0.5 MG tablet    Sig: Take 1 tablet (0.5 mg total) by mouth at bedtime as needed. for sleep    Dispense:  30 tablet    Refill:  5    Orders Placed This Encounter  Procedures  . DG Lumbar Spine 2-3 Views    Standing Status:   Future    Number of Occurrences:   1    Standing Expiration Date:   12/14/2018    Order Specific Question:   Reason for Exam (SYMPTOM  OR DIAGNOSIS REQUIRED)    Answer:   lumbar back pain    Order Specific Question:   Preferred imaging location?    Answer:   Internal  . Flu vaccine HIGH DOSE PF  . CBC with Differential/Platelet  . CMP14+EGFR  . Lipid panel  . TSH  . T4, Free  . Ambulatory referral to Physical Therapy    Referral Priority:   Routine    Referral Type:   Physical Medicine    Referral Reason:   Specialty Services Required    Requested Specialty:   Physical Therapy    Number of Visits Requested:   1    Follow up 2 weeks for lower back and as needed. Six-month checkup completed today as well. That will next be due in 6 months  [name], MD

## 2017-10-15 LAB — CMP14+EGFR
ALBUMIN: 4.7 g/dL (ref 3.5–4.7)
ALK PHOS: 57 IU/L (ref 39–117)
ALT: 21 IU/L (ref 0–32)
AST: 22 IU/L (ref 0–40)
Albumin/Globulin Ratio: 1.8 (ref 1.2–2.2)
BUN / CREAT RATIO: 15 (ref 12–28)
BUN: 13 mg/dL (ref 8–27)
Bilirubin Total: 0.6 mg/dL (ref 0.0–1.2)
CO2: 21 mmol/L (ref 20–29)
CREATININE: 0.85 mg/dL (ref 0.57–1.00)
Calcium: 9.9 mg/dL (ref 8.7–10.3)
Chloride: 102 mmol/L (ref 96–106)
GFR calc Af Amer: 73 mL/min/{1.73_m2} (ref >59)
GFR calc non Af Amer: 63 mL/min/{1.73_m2} (ref >59)
GLUCOSE: 87 mg/dL (ref 65–99)
Globulin, Total: 2.6 g/dL (ref 1.5–4.5)
Potassium: 4.8 mmol/L (ref 3.5–5.2)
Sodium: 142 mmol/L (ref 134–144)
TOTAL PROTEIN: 7.3 g/dL (ref 6.0–8.5)

## 2017-10-15 LAB — CBC WITH DIFFERENTIAL/PLATELET
BASOS: 0 %
Basophils Absolute: 0 10*3/uL (ref 0.0–0.2)
EOS (ABSOLUTE): 0.1 10*3/uL (ref 0.0–0.4)
Eos: 2 %
HEMATOCRIT: 37.7 % (ref 34.0–46.6)
HEMOGLOBIN: 12.8 g/dL (ref 11.1–15.9)
IMMATURE GRANS (ABS): 0 10*3/uL (ref 0.0–0.1)
Immature Granulocytes: 0 %
LYMPHS: 45 %
Lymphocytes Absolute: 1.4 10*3/uL (ref 0.7–3.1)
MCH: 30 pg (ref 26.6–33.0)
MCHC: 34 g/dL (ref 31.5–35.7)
MCV: 88 fL (ref 79–97)
Monocytes Absolute: 0.3 10*3/uL (ref 0.1–0.9)
Monocytes: 11 %
NEUTROS ABS: 1.3 10*3/uL — AB (ref 1.4–7.0)
Neutrophils: 42 %
PLATELETS: 259 10*3/uL (ref 150–379)
RBC: 4.27 x10E6/uL (ref 3.77–5.28)
RDW: 14.6 % (ref 12.3–15.4)
WBC: 3.1 10*3/uL — ABNORMAL LOW (ref 3.4–10.8)

## 2017-10-15 LAB — T4, FREE: FREE T4: 1.19 ng/dL (ref 0.82–1.77)

## 2017-10-15 LAB — LIPID PANEL
CHOLESTEROL TOTAL: 197 mg/dL (ref 100–199)
Chol/HDL Ratio: 3.4 ratio (ref 0.0–4.4)
HDL: 58 mg/dL (ref >39)
LDL CALC: 94 mg/dL (ref 0–99)
Triglycerides: 227 mg/dL — ABNORMAL HIGH (ref 0–149)
VLDL CHOLESTEROL CAL: 45 mg/dL — AB (ref 5–40)

## 2017-10-15 LAB — TSH: TSH: 3.96 u[IU]/mL (ref 0.450–4.500)

## 2017-10-17 ENCOUNTER — Telehealth: Payer: Self-pay | Admitting: Family Medicine

## 2017-10-17 NOTE — Telephone Encounter (Signed)
Spoke with son and advised it can take 5-10 business days to get referral to go through and get appt. Also advised it can take some time for back to feel better especially with arthritis so rest and use ice or heat to help with the discomfort and if pt doesn't improve they should call back. Pt's son voiced understanding.

## 2017-10-18 ENCOUNTER — Ambulatory Visit: Payer: Medicare Other | Admitting: Family Medicine

## 2017-10-22 ENCOUNTER — Encounter: Payer: Self-pay | Admitting: Physical Therapy

## 2017-10-22 ENCOUNTER — Ambulatory Visit: Payer: Medicare Other | Attending: Family Medicine | Admitting: Physical Therapy

## 2017-10-22 DIAGNOSIS — M545 Low back pain: Secondary | ICD-10-CM | POA: Diagnosis not present

## 2017-10-22 NOTE — Therapy (Signed)
[facility] [place], [place], [address] Phone: [phone]   Fax:  [phone]  Physical Therapy Evaluation  Patient Details  Name: Megan Chapman MRN: [id number] Date of Birth: 19-Aug-1931 Referring Provider: [name] MD.  Encounter Date: 10/22/2017      PT End of Session - 10/22/17 1448    Visit Number 1   Number of Visits 16   Date for PT Re-Evaluation 12/21/17   PT Start Time 0224   PT Stop Time 0313   PT Time Calculation (min) 49 min   Activity Tolerance Patient tolerated treatment well   Behavior During Therapy [facility] for tasks assessed/performed      Past Medical History:  Diagnosis Date  . Anxiety   . Asthma    not treated  . Basal cell carcinoma   . Cataract   . GERD (gastroesophageal reflux disease)    Not treated  . Hyperlipidemia   . Migraine   . Thyroid disease    hypothyroid    Past Surgical History:  Procedure Laterality Date  . ABDOMINAL HYSTERECTOMY     partial  . APPENDECTOMY    . BASAL CELL CARCINOMA EXCISION  2017   nose  . CATARACT EXTRACTION Bilateral   . MENISCUS REPAIR Right    03/11/15  . PARTIAL KNEE ARTHROPLASTY Left   . TONSILLECTOMY      There were no vitals filed for this visit.       Subjective Assessment - 10/22/17 1450    Subjective The patient reports that over the last several weeks she has been very busy at church and approximately 2 weeks ago she felt severe right sided low back back.  Upon presentation to the clinic today she was walking with HHA from her sonn due to severe pain.  Pain will also radiate from her right low back down the lateral side of her right thigh to the level of her knee.     Pertinent History Bilateral knee surgeries.  Chronic right foot pain.   How long can you walk comfortably? Short distances with HHA due to pain.   Patient Stated Goals Get out of pain.   Currently in Pain? Yes   Pain Score 10-Worst pain ever   Pain Location Back   Pain Orientation Right   Pain Descriptors / Indicators Shooting;Sharp   Pain Type Acute pain   Pain Onset 1 to 4 weeks ago   Pain Frequency Constant   Aggravating Factors  Movement.   Pain Relieving Factors Standing.            [facility] PT Assessment - 10/22/17 0001      Assessment   Medical Diagnosis Lumbar back pain.   Referring Provider [name] MD.   Onset Date/Surgical Date --  2 weeks.     Precautions   Precautions None     Restrictions   Weight Bearing Restrictions No     Balance Screen   Has the patient fallen in the past 6 months No   Has the patient had a decrease in activity level because of a fear of falling?  Yes   Is the patient reluctant to leave their home because of a fear of falling?  Yes     [place] residence     Prior Function   Level of Independence Independent     Observation/Other Assessments   Focus on Therapeutic Outcomes (FOTO)  65% limitation.     Posture/Postural Control  Posture/Postural Control Postural limitations   Postural Limitations Rounded Shoulders;Forward head;Decreased lumbar lordosis;Flexed trunk   Posture Comments Mild lumbar scoliosis.     ROM / Strength   AROM / PROM / Strength AROM;Strength     AROM   Overall AROM Comments Normal passive hip range of motion in supine.  Unable to assess spinal range of motion due to severe pain.     Strength   Overall Strength Comments Normal bilateral LE strength.     Palpation   Palpation comment Very tender to palpation over right SIJ (sacral iliac ligament).     Special Tests    Special Tests --  (+) RT FABER test. (-)SLR testing.     Ambulation/Gait   Gait Comments Patient in severe pain and walking in with trunk flexed in response to high pain-level.            Objective measurements completed on examination: See above findings.          [facility] Adult PT Treatment/Exercise - 10/22/17 0001      Modalities    Modalities Electrical Stimulation;Moist Heat     Moist Heat Therapy   Number Minutes Moist Heat 20 Minutes   Moist Heat Location Lumbar Spine     Electrical Stimulation   Electrical Stimulation Location Right SIJ.   Electrical Stimulation Action Pre-mod.   Electrical Stimulation Parameters Constant at 80-150 Hz x 20 minutes.   Electrical Stimulation Goals Pain                  PT Short Term Goals - 10/22/17 1520      PT SHORT TERM GOAL #1   Title STG's=LTG's.           PT Long Term Goals - 10/22/17 1520      PT LONG TERM GOAL #1   Title Independent with a HEP.   Time 8   Period Weeks   Status New     PT LONG TERM GOAL #2   Title Perform ADL's with pain not > 3/10.   Time 8   Period Weeks   Status New     PT LONG TERM GOAL #3   Title Eliminate right LE pain.   Time 8   Period Weeks   Status New                Plan - 10/22/17 1514    Clinical Impression Statement The patient presents to OPPT with a severe onset of right sided low back pain approximately 2 weeks ago.  She is very tender over the right SIJ and has a positive right FABER test.  Her pain-level was such that spinal range of motion could not be done today.  SLR testing was negative.  Pain and deficits have impaired the patient's functional mobility.  Patient will benefit from skilled physical therapy to address deficts.   Clinical Presentation Evolving   Clinical Decision Making Low   Rehab Potential Good   PT Frequency 2x / week   PT Duration 8 weeks   PT Treatment/Interventions ADLs/Self Care Home Management;Cryotherapy;Electrical Stimulation;Moist Heat;Ultrasound;Functional mobility training;Therapeutic activities;Therapeutic exercise;Manual techniques;Passive range of motion   PT Next Visit Plan SKTC stretch on right.  STW/M with patient in left sdly position with pillows between knees for comfort; Combo e'stim/U/S.  HMP and e'stim.  Gentle progression into core exercises due to  high pain-level.   Consulted and Agree with Plan of Care Patient      Patient will benefit from  skilled therapeutic intervention in order to improve the following deficits and impairments:  Difficulty walking, Pain, Decreased activity tolerance, Postural dysfunction  Visit Diagnosis: Acute right-sided low back pain, with sciatica presence unspecified - Plan: PT plan of care cert/re-cert      G-Codes - 20/60/15 1503    Functional Assessment Tool Used (Outpatient Only) FOTO...65% limitation.   Functional Limitation Mobility: Walking and moving around   Mobility: Walking and Moving Around Current Status [phone]) At least 60 percent but less than 80 percent impaired, limited or restricted   Mobility: Walking and Moving Around Goal Status [phone]) At least 20 percent but less than 40 percent impaired, limited or restricted       Problem List Patient Active Problem List   Diagnosis Date Noted  . Osteoarthritis 04/17/2017  . Hypothyroidism 10/11/2015  . Vitamin D deficiency 10/11/2015  . Hyperlipidemia 06/08/2013    APPLEGATE, [place] MPT 10/22/2017, 3:24 PM  [facility] [address] [place], [place], [address] Phone: [phone]   Fax:  [phone]  Name: Cloyce Paterson MRN: [id number] Date of Birth: 1931-10-03

## 2017-10-24 ENCOUNTER — Ambulatory Visit: Payer: Medicare Other | Admitting: Physical Therapy

## 2017-10-24 DIAGNOSIS — M545 Low back pain: Secondary | ICD-10-CM

## 2017-10-24 NOTE — Therapy (Signed)
[facility] [place], [place], [address] Phone: [phone]   Fax:  [phone]  Physical Therapy Treatment  Patient Details  Name: Megan Chapman MRN: [id number] Date of Birth: Jan 30, 1931 Referring Provider: [name] MD.  Encounter Date: 10/24/2017      PT End of Session - 10/24/17 1734    Visit Number 2   Number of Visits 16   Date for PT Re-Evaluation 12/21/17   PT Start Time 0315   PT Stop Time 0404   PT Time Calculation (min) 49 min      Past Medical History:  Diagnosis Date  . Anxiety   . Asthma    not treated  . Basal cell carcinoma   . Cataract   . GERD (gastroesophageal reflux disease)    Not treated  . Hyperlipidemia   . Migraine   . Thyroid disease    hypothyroid    Past Surgical History:  Procedure Laterality Date  . ABDOMINAL HYSTERECTOMY     partial  . APPENDECTOMY    . BASAL CELL CARCINOMA EXCISION  2017   nose  . CATARACT EXTRACTION Bilateral   . MENISCUS REPAIR Right    03/11/15  . PARTIAL KNEE ARTHROPLASTY Left   . TONSILLECTOMY      There were no vitals filed for this visit.      Subjective Assessment - 10/24/17 1735    Subjective That treatment helped.   Pain Score 8    Pain Location Back   Pain Orientation Right   Pain Descriptors / Indicators Sharp;Shooting   Pain Type Acute pain   Pain Onset 1 to 4 weeks ago                         [facility] Adult PT Treatment/Exercise - 10/24/17 0001      Modalities   Modalities Electrical Stimulation;Moist Heat     Moist Heat Therapy   Number Minutes Moist Heat 20 Minutes   Moist Heat Location Lumbar Spine     Electrical Stimulation   Electrical Stimulation Location Right SIJ   Electrical Stimulation Action Pre-mod   Electrical Stimulation Parameters Constant at 80-150 Hz x 20 minutes.   Electrical Stimulation Goals Pain     Manual Therapy   Manual Therapy Soft tissue mobilization   Manual therapy  comments Patient in left sdly position with folded pillow between knees for comfort:  Performed STW/M to right SIJ region including QL release technique x 23 minutes.                  PT Short Term Goals - 10/22/17 1520      PT SHORT TERM GOAL #1   Title STG's=LTG's.           PT Long Term Goals - 10/22/17 1520      PT LONG TERM GOAL #1   Title Independent with a HEP.   Time 8   Period Weeks   Status New     PT LONG TERM GOAL #2   Title Perform ADL's with pain not > 3/10.   Time 8   Period Weeks   Status New     PT LONG TERM GOAL #3   Title Eliminate right LE pain.   Time 8   Period Weeks   Status New             Patient will benefit from skilled therapeutic intervention in order to improve the following deficits  and impairments:     Visit Diagnosis: Acute right-sided low back pain, with sciatica presence unspecified     Problem List Patient Active Problem List   Diagnosis Date Noted  . Osteoarthritis 04/17/2017  . Hypothyroidism 10/11/2015  . Vitamin D deficiency 10/11/2015  . Hyperlipidemia 06/08/2013    APPLEGATE, [place] MPT 10/24/2017, 5:48 PM  [facility] [place], [place], [address] Phone: [phone]   Fax:  [phone]  Name: Taylia Berber MRN: [id number] Date of Birth: 12-Dec-1931

## 2017-10-28 ENCOUNTER — Ambulatory Visit: Payer: Medicare Other | Admitting: Physical Therapy

## 2017-10-28 ENCOUNTER — Encounter: Payer: Self-pay | Admitting: Family Medicine

## 2017-10-28 ENCOUNTER — Ambulatory Visit (INDEPENDENT_AMBULATORY_CARE_PROVIDER_SITE_OTHER): Payer: Medicare Other | Admitting: Family Medicine

## 2017-10-28 VITALS — BP 126/70 | HR 80 | Temp 97.8°F | Ht 62.0 in | Wt 148.0 lb

## 2017-10-28 DIAGNOSIS — M545 Low back pain, unspecified: Secondary | ICD-10-CM

## 2017-10-28 MED ORDER — BETAMETHASONE SOD PHOS & ACET 6 (3-3) MG/ML IJ SUSP
6.0000 mg | Freq: Once | INTRAMUSCULAR | Status: AC
  Administered 2017-10-28: 6 mg via INTRAMUSCULAR

## 2017-10-28 MED ORDER — DOCUSATE SODIUM 100 MG PO CAPS: 100.0000 mg | ORAL_CAPSULE | Freq: Two times a day (BID) | ORAL | 2 refills | Status: DC

## 2017-10-28 MED ORDER — PREDNISONE 10 MG (48) PO TBPK: ORAL_TABLET | ORAL | 0 refills | Status: DC

## 2017-10-28 NOTE — Patient Instructions (Signed)
Abdominal bracing   Flatten back by tightening stomach muscles. Hold 3-5 seconds. Release. Repeat _10___ times per set. Do _1-3___ sets per session. Do __2__ sessions per day. Try to do in sitting and standing as well.  http://orth.exer.us/134   Copyright  VHI. All rights reserved  Bridge   Lie back, legs bent. Perform abdominal set above and then lift your bottom slowly off the bed. Repeat 10-30 times. Do __2__ sessions per day.  Sleeping on Back  Place pillow under knees. A pillow with cervical support and a roll around waist are also helpful. Copyright  VHI. All rights reserved.  Sleeping on Side Place pillow between knees. Use cervical support under neck and a roll around waist as needed. Copyright  VHI. All rights reserved.   Sleeping on Stomach   If this is the only desirable sleeping position, place pillow under lower legs, and under stomach or chest as needed.  Posture - Sitting   Sit upright, head facing forward. Try using a roll to support lower back. Keep shoulders relaxed, and avoid rounded back. Keep hips level with knees. Avoid crossing legs for long periods. Stand to Sit / Sit to Stand   To sit: Bend knees to lower self onto front edge of chair, then scoot back on seat. To stand: Reverse sequence by placing one foot forward, and scoot to front of seat. Use rocking motion to stand up.   Work Height and Reach  Ideal work height is no more than 2 to 4 inches below elbow level when standing, and at elbow level when sitting. Reaching should be limited to arm's length, with elbows slightly bent.  Bending  Bend at hips and knees, not back. Keep feet shoulder-width apart.    Posture - Standing   Good posture is important. Avoid slouching and forward head thrust. Maintain curve in low back and align ears over shoul- ders, hips over ankles.  Alternating Positions   Alternate tasks and change positions frequently to reduce fatigue and muscle tension. Take  rest breaks. Computer Work   Position work to Programmer, multimedia. Use proper work and seat height. Keep shoulders back and down, wrists straight, and elbows at right angles. Use chair that provides full back support. Add footrest and lumbar roll as needed.  Getting Into / Out of Car  Lower self onto seat, scoot back, then bring in one leg at a time. Reverse sequence to get out.  Dressing  Lie on back to pull socks or slacks over feet, or sit and bend leg while keeping back straight.    Housework - Sink  Place one foot on ledge of cabinet under sink when standing at sink for prolonged periods.   Pushing / Pulling  Pushing is preferable to pulling. Keep back in proper alignment, and use leg muscles to do the work.  Deep Squat   Squat and lift with both arms held against upper trunk. Tighten stomach muscles without holding breath. Use smooth movements to avoid jerking.  Avoid Twisting   Avoid twisting or bending back. Pivot around using foot movements, and bend at knees if needed when reaching for articles.  Carrying Luggage   Distribute weight evenly on both sides. Use a cart whenever possible. Do not twist trunk. Move body as a unit.   Lifting Principles .Maintain proper posture and head alignment. .Slide object as close as possible before lifting. .Move obstacles out of the way. .Test before lifting; ask for help if too heavy. .Tighten stomach muscles without  holding breath. .Use smooth movements; do not jerk. .Use legs to do the work, and pivot with feet. .Distribute the work load symmetrically and close to the center of trunk. .Push instead of pull whenever possible.   Ask For Help   Ask for help and delegate to others when possible. Coordinate your movements when lifting together, and maintain the low back curve.  Log Roll   Lying on back, bend left knee and place left arm across chest. Roll all in one movement to the right. Reverse to roll to the left. Always  move as one unit. Housework - Sweeping  Use long-handled equipment to avoid stooping.   Housework - Wiping  Position yourself as close as possible to reach work surface. Avoid straining your back.  Laundry - Unloading Wash   To unload small items at bottom of washer, lift leg opposite to arm being used to reach.  [name] Use arm movements to do the work. Keep back straight and avoid twisting.     Cart  When reaching into cart with one arm, lift opposite leg to keep back straight.   Getting Into / Out of Bed  Lower self to lie down on one side by raising legs and lowering head at the same time. Use arms to assist moving without twisting. Bend both knees to roll onto back if desired. To sit up, start from lying on side, and use same move-ments in reverse. Housework - Vacuuming  Hold the vacuum with arm held at side. Step back and forth to move it, keeping head up. Avoid twisting.   Laundry - IT consultant so that bending and twisting can be avoided.   Laundry - Unloading Dryer  Squat down to reach into clothes dryer or use a reacher.  Gardening - Weeding / Probation officer or Kneel. Knee pads may be helpful.                    Megan Chapman, PT 10/28/17 12:12 PM; [facility] [place], [place], [address] Phone: [phone]   Fax:  [phone]

## 2017-10-28 NOTE — Therapy (Signed)
[facility] [place], [place], [address] Phone: [phone]   Fax:  [phone]  Physical Therapy Treatment  Patient Details  Name: Megan Chapman MRN: [id number] Date of Birth: 04-02-1931 Referring Provider: [name] MD.  Encounter Date: 10/28/2017      PT End of Session - 10/28/17 1123    Visit Number 3   Number of Visits 16   Date for PT Re-Evaluation 12/21/17   PT Start Time 1115   PT Stop Time 1216   PT Time Calculation (min) 61 min   Activity Tolerance Patient tolerated treatment well   Behavior During Therapy [facility] for tasks assessed/performed      Past Medical History:  Diagnosis Date  . Anxiety   . Asthma    not treated  . Basal cell carcinoma   . Cataract   . GERD (gastroesophageal reflux disease)    Not treated  . Hyperlipidemia   . Migraine   . Thyroid disease    hypothyroid    Past Surgical History:  Procedure Laterality Date  . ABDOMINAL HYSTERECTOMY     partial  . APPENDECTOMY    . BASAL CELL CARCINOMA EXCISION  2017   nose  . CATARACT EXTRACTION Bilateral   . MENISCUS REPAIR Right    03/11/15  . PARTIAL KNEE ARTHROPLASTY Left   . TONSILLECTOMY      There were no vitals filed for this visit.      Subjective Assessment - 10/28/17 1124    Subjective Patient states her back is better overall. Yesterday was the first good day I've had.   Pertinent History Bilateral knee surgeries.  Chronic right foot pain.   How long can you walk comfortably? Short distances with HHA due to pain.   Patient Stated Goals Get out of pain.   Currently in Pain? Yes   Pain Score 2    Pain Location Back  grabbing   Pain Orientation Right   Pain Descriptors / Indicators --  grabbing   Pain Type Acute pain   Pain Onset 1 to 4 weeks ago   Pain Frequency Constant   Aggravating Factors  lying down in bed   Pain Relieving Factors standing                         [facility] Adult PT  Treatment/Exercise - 10/28/17 0001      Therapeutic Activites    Therapeutic Activities ADL's   ADL's worked on supine <> sit and bed mobility using core engagement     Exercises   Exercises Lumbar     Lumbar Exercises: Stretches   Piriformis Stretch Limitations attempted various piriformis stretches. figure 4 the best with PT assist     Lumbar Exercises: Aerobic   Stationary Bike Nustep L3 with ab set x 10 min     Lumbar Exercises: Supine   Ab Set 15 reps   AB Set Limitations pelvic rocking   Bridge 5 reps   Bridge Limitations small range for bed mobility     Modalities   Modalities Electrical Stimulation;Moist Heat     Moist Heat Therapy   Number Minutes Moist Heat 15 Minutes   Moist Heat Location Lumbar Spine     Electrical Stimulation   Electrical Stimulation Location Right PSIS region and piriformis   Electrical Stimulation Action premod   Electrical Stimulation Parameters 80-150 Hz x 15 min   Electrical Stimulation Goals Pain     Manual Therapy  Manual Therapy Myofascial release   Myofascial Release to R piriformis and gluteals; utilized clams with TPR to piriformis                PT Education - 10/28/17 1212    Education provided Yes   Education Details HEP; bed mobility   Person(s) Educated Patient   Methods Explanation;Demonstration;Tactile cues;Verbal cues;Handout   Comprehension Verbalized understanding;Returned demonstration          PT Short Term Goals - 10/22/17 1520      PT SHORT TERM GOAL #1   Title STG's=LTG's.           PT Long Term Goals - 10/22/17 1520      PT LONG TERM GOAL #1   Title Independent with a HEP.   Time 8   Period Weeks   Status New     PT LONG TERM GOAL #2   Title Perform ADL's with pain not > 3/10.   Time 8   Period Weeks   Status New     PT LONG TERM GOAL #3   Title Eliminate right LE pain.   Time 8   Period Weeks   Status New               Plan - 10/28/17 1213    Clinical  Impression Statement Patient reports improvement since last viist. She still experiences grabbing pain with bed mobility, but did well with ab sets which helped decrease incidence of pain some.    PT Treatment/Interventions ADLs/Self Care Home Management;Cryotherapy;Electrical Stimulation;Moist Heat;Ultrasound;Functional mobility training;Therapeutic activities;Therapeutic exercise;Manual techniques;Passive range of motion   PT Next Visit Plan ADL modifications (handout given but needs to be covered); SKTC stretch on right.  STW/M with patient in left sdly position with pillows between knees for comfort; Combo e'stim/U/S.  HMP and e'stim.  Gentle progression into core exercises due to high pain-level.   PT Home Exercise Plan ab set, bridge; adl mod      Patient will benefit from skilled therapeutic intervention in order to improve the following deficits and impairments:  Difficulty walking, Pain, Decreased activity tolerance, Postural dysfunction  Visit Diagnosis: Acute right-sided low back pain, with sciatica presence unspecified     Problem List Patient Active Problem List   Diagnosis Date Noted  . Osteoarthritis 04/17/2017  . Hypothyroidism 10/11/2015  . Vitamin D deficiency 10/11/2015  . Hyperlipidemia 06/08/2013    Madelyn Flavors PT 10/28/2017, 12:23 PM  [facility] [address] [place], [place], [address] Phone: [phone]   Fax:  [phone]  Name: Megan Chapman MRN: [id number] Date of Birth: 01/22/1931

## 2017-10-28 NOTE — Progress Notes (Signed)
Subjective:  Patient ID: Megan Chapman, female    DOB: 09-Oct-1931  Age: 83 y.o. MRN: [id number]  CC: Back Pain (pt here today following up for lumbar back pain and it had gotten worse where she is barely able to get up off the toilet or do anything. Pt has had 2 therapy sessions.)   HPI Megan Chapman presents for Lack of improvement until yesterday. Pain radiating from the L5 region to the SI region and down the posterior right lower extremity to the lower thigh. Seemed to start getting some better yesterday. She started physical therapy by going twice last week. She states that the first treatment though was more introductory and evaluation. He worked harder on her last Thursday and has of yesterday she seems to started getting better. She is here with her son who states that she just hasn't made the progress he hoped for.  Depression screen [facility] 2/9 10/14/2017 04/17/2017 10/12/2016  Decreased Interest 0 0 0  Down, Depressed, Hopeless 0 0 1  PHQ - 2 Score 0 0 1  Altered sleeping - - -  Tired, decreased energy - - -  Change in appetite - - -  Feeling bad or failure about yourself  - - -  Trouble concentrating - - -  Moving slowly or fidgety/restless - - -  Suicidal thoughts - - -  PHQ-9 Score - - -  Difficult doing work/chores - - -    History Megan Chapman has a past medical history of Anxiety; Asthma; Basal cell carcinoma; Cataract; GERD (gastroesophageal reflux disease); Hyperlipidemia; Migraine; and Thyroid disease.   She has a past surgical history that includes Partial knee arthroplasty (Left); Abdominal hysterectomy; Meniscus repair (Right); Excision basal cell carcinoma (2017); Cataract extraction (Bilateral); Appendectomy; and Tonsillectomy.   Her family history includes COPD in her son; Cancer in her brother, father, and mother; Down syndrome in her daughter; Migraines in her brother and son; Stroke in her brother.She reports that she has never smoked. She has never used smokeless  tobacco. She reports that she does not drink alcohol or use drugs.    ROS Review of Systems  Constitutional: Positive for activity change. Negative for appetite change and fever.  HENT: Negative for congestion.   Gastrointestinal: Negative for abdominal pain.  Musculoskeletal: Positive for arthralgias, back pain and myalgias.    Objective:  BP 126/70   Pulse 80   Temp 97.8 F (36.6 C) (Oral)   Ht 5\' 2"  (1.575 m)   Wt 148 lb (67.1 kg)   BMI 27.07 kg/m   BP Readings from Last 3 Encounters:  10/28/17 126/70  10/14/17 (!) 159/80  04/17/17 130/67    Wt Readings from Last 3 Encounters:  10/28/17 148 lb (67.1 kg)  10/14/17 148 lb (67.1 kg)  04/17/17 148 lb (67.1 kg)     Physical Exam  Constitutional: She is oriented to person, place, and time. She appears well-developed and well-nourished. No distress.  HENT:  Head: Normocephalic and atraumatic.  Eyes: Pupils are equal, round, and reactive to light. Conjunctivae and EOM are normal.  Neck: Normal range of motion. Neck supple.  Cardiovascular: Normal rate, regular rhythm and normal heart sounds.   No murmur heard. Pulmonary/Chest: Effort normal and breath sounds normal. No respiratory distress. She has no wheezes. She has no rales.  Abdominal: Soft. Bowel sounds are normal. She exhibits no distension. There is no tenderness.  Musculoskeletal: She exhibits tenderness (at right SI and right L5 region. Unable to lay back for further  exam due to pain.). She exhibits no edema.       Lumbar back: She exhibits decreased range of motion, tenderness and spasm. She exhibits no deformity and normal pulse.  Neurological: She is alert and oriented to person, place, and time. She has normal reflexes.  Skin: Skin is warm and dry.  Psychiatric: She has a normal mood and affect. Her behavior is normal. Thought content normal.      Assessment & Plan:   Megan Chapman was seen today for back pain.  Diagnoses and all orders for this  visit:  Lumbar back pain -     betamethasone acetate-betamethasone sodium phosphate (CELESTONE) injection 6 mg; Inject 1 mL (6 mg total) into the muscle once.  Other orders -     predniSONE (STERAPRED UNI-PAK 48 TAB) 10 MG (48) TBPK tablet; Take as directed -     docusate sodium (COLACE) 100 MG capsule; Take 1 capsule (100 mg total) by mouth 2 (two) times daily.       I have discontinued Megan Chapman's cyclobenzaprine and cyclobenzaprine. I am also having her start on predniSONE and docusate sodium. Additionally, I am having her maintain her aspirin, Vitamin D3, Fish Oil, atorvastatin, levothyroxine, traMADol, and ALPRAZolam. We administered betamethasone acetate-betamethasone sodium phosphate.  Allergies as of 10/28/2017      Reactions   Amoxicillin    Biaxin [clarithromycin]    Codeine    Nitrofurantoin Nausea And Vomiting, Nausea Only   Sulfa Antibiotics       Medication List       Accurate as of 10/28/17 10:39 AM. Always use your most recent med list.          ALPRAZolam 0.5 MG tablet Commonly known as:  XANAX Take 1 tablet (0.5 mg total) by mouth at bedtime as needed. for sleep   aspirin 81 MG tablet Take 81 mg by mouth daily.   atorvastatin 40 MG tablet Commonly known as:  LIPITOR Take 1 tablet (40 mg total) by mouth daily.   docusate sodium 100 MG capsule Commonly known as:  COLACE Take 1 capsule (100 mg total) by mouth 2 (two) times daily.   Fish Oil 1200 MG Caps Take 2 capsules by mouth daily.   levothyroxine 25 MCG tablet Commonly known as:  SYNTHROID, LEVOTHROID TAKE (1) TABLET DAILY BE- FORE BREAKFAST.   predniSONE 10 MG (48) Tbpk tablet Commonly known as:  STERAPRED UNI-PAK 48 TAB Take as directed   traMADol 50 MG tablet Commonly known as:  ULTRAM Take 1 tablet (50 mg total) by mouth 4 (four) times daily.   Vitamin D3 1000 units Caps Take 2 capsules (2,000 Units total) by mouth daily.        Follow-up: Return in about 2 weeks (around  11/11/2017).  [name], M.D.

## 2017-10-31 ENCOUNTER — Ambulatory Visit: Payer: Medicare Other | Attending: Family Medicine | Admitting: Physical Therapy

## 2017-10-31 DIAGNOSIS — M545 Low back pain: Secondary | ICD-10-CM

## 2017-10-31 NOTE — Therapy (Signed)
[facility] [place], [place], [address] Phone: [phone]   Fax:  [phone]  Physical Therapy Treatment  Patient Details  Name: Megan Chapman MRN: [id number] Date of Birth: [birth year] Referring Provider: [name] MD.  Encounter Date: 10/31/2017      PT End of Session - 10/31/17 1111    Visit Number 4   Number of Visits 16   Date for PT Re-Evaluation 12/21/17   PT Start Time 1030   PT Stop Time 7408   PT Time Calculation (min) 56 min      Past Medical History:  Diagnosis Date  . Anxiety   . Asthma    not treated  . Basal cell carcinoma   . Cataract   . GERD (gastroesophageal reflux disease)    Not treated  . Hyperlipidemia   . Migraine   . Thyroid disease    hypothyroid    Past Surgical History:  Procedure Laterality Date  . ABDOMINAL HYSTERECTOMY     partial  . APPENDECTOMY    . BASAL CELL CARCINOMA EXCISION  2017   nose  . CATARACT EXTRACTION Bilateral   . MENISCUS REPAIR Right    03/11/15  . PARTIAL KNEE ARTHROPLASTY Left   . TONSILLECTOMY      There were no vitals filed for this visit.      Subjective Assessment - 10/31/17 1112    Subjective I'm doing better.  Pain is less and I'm getting in and out of bed easier.  I twisted yesterday at the ATM and it hurt.   Pain Score 3    Pain Location Back   Pain Orientation Right   Pain Descriptors / Indicators Sharp  "Grabs."                         [facility] Adult PT Treatment/Exercise - 10/31/17 0001      Exercises   Exercises Knee/Hip     Lumbar Exercises: Aerobic   Stationary Bike Nustep level 3 x 15 minutes.     Moist Heat Therapy   Number Minutes Moist Heat 15 Minutes   Moist Heat Location Lumbar Spine     Electrical Stimulation   Electrical Stimulation Location Right low back/upper gluteal region.   Electrical Stimulation Action IFC   Electrical Stimulation Parameters 80-150 Hz x 15 minutes.   Electrical  Stimulation Goals Pain     Manual Therapy   Manual Therapy Soft tissue mobilization   Soft tissue mobilization STW/M in left sdly position with folded pillow between knees for comfort to right low back and upper gluteal region with QL release x 8 minutes.                  PT Short Term Goals - 10/22/17 1520      PT SHORT TERM GOAL #1   Title STG's=LTG's.           PT Long Term Goals - 10/22/17 1520      PT LONG TERM GOAL #1   Title Independent with a HEP.   Time 8   Period Weeks   Status New     PT LONG TERM GOAL #2   Title Perform ADL's with pain not > 3/10.   Time 8   Period Weeks   Status New     PT LONG TERM GOAL #3   Title Eliminate right LE pain.   Time 8   Period Weeks   Status New  Plan - 10/31/17 1121    Clinical Impression Statement Excellent response to treatment today.  Active TP in right QL today released.      Patient will benefit from skilled therapeutic intervention in order to improve the following deficits and impairments:  Difficulty walking, Pain, Decreased activity tolerance, Postural dysfunction  Visit Diagnosis: Acute right-sided low back pain, with sciatica presence unspecified     Problem List Patient Active Problem List   Diagnosis Date Noted  . Osteoarthritis 04/17/2017  . Hypothyroidism 10/11/2015  . Vitamin D deficiency 10/11/2015  . Hyperlipidemia 06/08/2013    Tray Klayman, [place] MPT 10/31/2017, 11:27 AM  [facility] [address] [place], [place], [address] Phone: [phone]   Fax:  [phone]  Name: Megan Chapman MRN: [id number] Date of Birth: 1931/04/26

## 2017-11-04 ENCOUNTER — Telehealth: Payer: Self-pay | Admitting: Family Medicine

## 2017-11-04 DIAGNOSIS — M159 Polyosteoarthritis, unspecified: Secondary | ICD-10-CM

## 2017-11-04 DIAGNOSIS — M150 Primary generalized (osteo)arthritis: Principal | ICD-10-CM

## 2017-11-04 NOTE — Telephone Encounter (Signed)
Please order the shower chair as requested. Thanks, WS

## 2017-11-04 NOTE — Telephone Encounter (Signed)
Patient aware order placed up front for her.

## 2017-11-05 ENCOUNTER — Ambulatory Visit: Payer: Medicare Other | Admitting: Physical Therapy

## 2017-11-05 DIAGNOSIS — M5442 Lumbago with sciatica, left side: Secondary | ICD-10-CM | POA: Diagnosis not present

## 2017-11-05 DIAGNOSIS — M545 Low back pain: Secondary | ICD-10-CM

## 2017-11-05 DIAGNOSIS — M47816 Spondylosis without myelopathy or radiculopathy, lumbar region: Secondary | ICD-10-CM | POA: Diagnosis not present

## 2017-11-05 DIAGNOSIS — M9903 Segmental and somatic dysfunction of lumbar region: Secondary | ICD-10-CM | POA: Diagnosis not present

## 2017-11-05 NOTE — Therapy (Addendum)
[facility] [place], [place], [address] Phone: [phone]   Fax:  [phone]  Physical Therapy Treatment  Patient Details  Name: Megan Chapman MRN: [id number] Date of Birth: 03/04/1931 Referring Provider: [name] MD.   Encounter Date: 11/05/2017  PT End of Session - 11/05/17 0952    Visit Number  5    Number of Visits  16    Date for PT Re-Evaluation  12/21/17    PT Start Time  7681       Past Medical History:  Diagnosis Date  . Anxiety   . Asthma    not treated  . Basal cell carcinoma   . Cataract   . GERD (gastroesophageal reflux disease)    Not treated  . Hyperlipidemia   . Migraine   . Thyroid disease    hypothyroid    Past Surgical History:  Procedure Laterality Date  . ABDOMINAL HYSTERECTOMY     partial  . APPENDECTOMY    . BASAL CELL CARCINOMA EXCISION  2017   nose  . CATARACT EXTRACTION Bilateral   . MENISCUS REPAIR Right    03/11/15  . PARTIAL KNEE ARTHROPLASTY Left   . TONSILLECTOMY      There were no vitals filed for this visit.  Subjective Assessment - 11/05/17 0952    Subjective  I did well after last treatment until Saturday morning.  I had "3 catches" since then and hurts to put weight on right side.    Pain Score  6     Pain Location  Back    Pain Orientation  Right    Pain Descriptors / Indicators  Sharp    Pain Onset  More than a month ago                      [facility] Adult PT Treatment/Exercise - 11/05/17 0001      Exercises   Exercises  Knee/Hip      Lumbar Exercises: Aerobic   Stationary Bike  Nustep level 2 x 19 minutes.      Modalities   Modalities  Electrical Stimulation;Moist Heat      Moist Heat Therapy   Number Minutes Moist Heat  20 Minutes    Moist Heat Location  Lumbar Spine      Electrical Stimulation   Electrical Stimulation Location  Right SIJ    Electrical Stimulation Action  Constant Pre-mod.    Electrical Stimulation Parameters   80-150 HZ x 20 minutes.    Electrical Stimulation Goals  Pain      Manual Therapy   Manual Therapy  Muscle Energy Technique    Muscle Energy Technique  Reverse muscle energy technique including adductor squeeze to resisted right hip flexion and gentle sustained right LE manual traction x 5 minutes.               PT Short Term Goals - 10/22/17 1520      PT SHORT TERM GOAL #1   Title  STG's=LTG's.        PT Long Term Goals - 10/22/17 1520      PT LONG TERM GOAL #1   Title  Independent with a HEP.    Time  8    Period  Weeks    Status  New      PT LONG TERM GOAL #2   Title  Perform ADL's with pain not > 3/10.    Time  8    Period  Weeks    Status  New      PT LONG TERM GOAL #3   Title  Eliminate right LE pain.    Time  8    Period  Weeks    Status  New            Plan - 11/05/17 1117    Clinical Impression Statement  The patient flt better after treatment today but continues to have significant pain in the right SIJ region.  She had a leg length discrepancy with right shorter than left that was corrected and resulted in reducing her pain.       Patient will benefit from skilled therapeutic intervention in order to improve the following deficits and impairments:     Visit Diagnosis: Acute right-sided low back pain, with sciatica presence unspecified     Problem List Patient Active Problem List   Diagnosis Date Noted  . Osteoarthritis 04/17/2017  . Hypothyroidism 10/11/2015  . Vitamin D deficiency 10/11/2015  . Hyperlipidemia 06/08/2013    Berta Denson, [place] MPT 11/05/2017, 11:20 AM  [facility] [place], [place], [address] Phone: [phone]   Fax:  [phone]  Name: Panzy Bubeck MRN: [id number] Date of Birth: March 20, 1931  PHYSICAL THERAPY DISCHARGE SUMMARY  Visits from Start of Care: 5.   Current functional level related to goals / functional outcomes: See above.   Remaining  deficits: Continued LBP.   Education / Equipment: HEP. Plan: Patient agrees to discharge.  Patient goals were not met. Patient is being discharged due to not returning since the last visit.  ?????         [place] Bo Teicher MPT

## 2017-11-06 DIAGNOSIS — M47816 Spondylosis without myelopathy or radiculopathy, lumbar region: Secondary | ICD-10-CM | POA: Diagnosis not present

## 2017-11-06 DIAGNOSIS — M5442 Lumbago with sciatica, left side: Secondary | ICD-10-CM | POA: Diagnosis not present

## 2017-11-06 DIAGNOSIS — M9903 Segmental and somatic dysfunction of lumbar region: Secondary | ICD-10-CM | POA: Diagnosis not present

## 2017-11-07 ENCOUNTER — Other Ambulatory Visit: Payer: Self-pay | Admitting: Family Medicine

## 2017-11-07 DIAGNOSIS — M9903 Segmental and somatic dysfunction of lumbar region: Secondary | ICD-10-CM | POA: Diagnosis not present

## 2017-11-07 DIAGNOSIS — M47816 Spondylosis without myelopathy or radiculopathy, lumbar region: Secondary | ICD-10-CM | POA: Diagnosis not present

## 2017-11-07 DIAGNOSIS — M5442 Lumbago with sciatica, left side: Secondary | ICD-10-CM | POA: Diagnosis not present

## 2017-11-08 ENCOUNTER — Encounter: Payer: Medicare Other | Admitting: Physical Therapy

## 2017-11-11 ENCOUNTER — Ambulatory Visit: Payer: Medicare Other | Admitting: Family Medicine

## 2017-11-12 DIAGNOSIS — M47816 Spondylosis without myelopathy or radiculopathy, lumbar region: Secondary | ICD-10-CM | POA: Diagnosis not present

## 2017-11-12 DIAGNOSIS — M5442 Lumbago with sciatica, left side: Secondary | ICD-10-CM | POA: Diagnosis not present

## 2017-11-12 DIAGNOSIS — M9903 Segmental and somatic dysfunction of lumbar region: Secondary | ICD-10-CM | POA: Diagnosis not present

## 2017-11-14 DIAGNOSIS — M5442 Lumbago with sciatica, left side: Secondary | ICD-10-CM | POA: Diagnosis not present

## 2017-11-14 DIAGNOSIS — M9903 Segmental and somatic dysfunction of lumbar region: Secondary | ICD-10-CM | POA: Diagnosis not present

## 2017-11-14 DIAGNOSIS — M47816 Spondylosis without myelopathy or radiculopathy, lumbar region: Secondary | ICD-10-CM | POA: Diagnosis not present

## 2017-11-19 DIAGNOSIS — M5442 Lumbago with sciatica, left side: Secondary | ICD-10-CM | POA: Diagnosis not present

## 2017-11-19 DIAGNOSIS — M9903 Segmental and somatic dysfunction of lumbar region: Secondary | ICD-10-CM | POA: Diagnosis not present

## 2017-11-19 DIAGNOSIS — M47816 Spondylosis without myelopathy or radiculopathy, lumbar region: Secondary | ICD-10-CM | POA: Diagnosis not present

## 2017-11-25 DIAGNOSIS — M5442 Lumbago with sciatica, left side: Secondary | ICD-10-CM | POA: Diagnosis not present

## 2017-11-25 DIAGNOSIS — M47816 Spondylosis without myelopathy or radiculopathy, lumbar region: Secondary | ICD-10-CM | POA: Diagnosis not present

## 2017-11-25 DIAGNOSIS — M9903 Segmental and somatic dysfunction of lumbar region: Secondary | ICD-10-CM | POA: Diagnosis not present

## 2017-11-28 DIAGNOSIS — M9903 Segmental and somatic dysfunction of lumbar region: Secondary | ICD-10-CM | POA: Diagnosis not present

## 2017-11-28 DIAGNOSIS — M5442 Lumbago with sciatica, left side: Secondary | ICD-10-CM | POA: Diagnosis not present

## 2017-11-28 DIAGNOSIS — M47816 Spondylosis without myelopathy or radiculopathy, lumbar region: Secondary | ICD-10-CM | POA: Diagnosis not present

## 2017-12-04 DIAGNOSIS — M5442 Lumbago with sciatica, left side: Secondary | ICD-10-CM | POA: Diagnosis not present

## 2017-12-04 DIAGNOSIS — M9903 Segmental and somatic dysfunction of lumbar region: Secondary | ICD-10-CM | POA: Diagnosis not present

## 2017-12-04 DIAGNOSIS — M47816 Spondylosis without myelopathy or radiculopathy, lumbar region: Secondary | ICD-10-CM | POA: Diagnosis not present

## 2017-12-11 DIAGNOSIS — M9903 Segmental and somatic dysfunction of lumbar region: Secondary | ICD-10-CM | POA: Diagnosis not present

## 2017-12-11 DIAGNOSIS — M47816 Spondylosis without myelopathy or radiculopathy, lumbar region: Secondary | ICD-10-CM | POA: Diagnosis not present

## 2017-12-11 DIAGNOSIS — M5442 Lumbago with sciatica, left side: Secondary | ICD-10-CM | POA: Diagnosis not present

## 2017-12-18 DIAGNOSIS — M9903 Segmental and somatic dysfunction of lumbar region: Secondary | ICD-10-CM | POA: Diagnosis not present

## 2017-12-18 DIAGNOSIS — M5442 Lumbago with sciatica, left side: Secondary | ICD-10-CM | POA: Diagnosis not present

## 2017-12-18 DIAGNOSIS — M47816 Spondylosis without myelopathy or radiculopathy, lumbar region: Secondary | ICD-10-CM | POA: Diagnosis not present

## 2018-01-01 DIAGNOSIS — M47816 Spondylosis without myelopathy or radiculopathy, lumbar region: Secondary | ICD-10-CM | POA: Diagnosis not present

## 2018-01-01 DIAGNOSIS — M5442 Lumbago with sciatica, left side: Secondary | ICD-10-CM | POA: Diagnosis not present

## 2018-01-01 DIAGNOSIS — M9903 Segmental and somatic dysfunction of lumbar region: Secondary | ICD-10-CM | POA: Diagnosis not present

## 2018-01-03 ENCOUNTER — Encounter: Payer: Self-pay | Admitting: Family Medicine

## 2018-01-03 ENCOUNTER — Ambulatory Visit (INDEPENDENT_AMBULATORY_CARE_PROVIDER_SITE_OTHER): Payer: Medicare Other | Admitting: Family Medicine

## 2018-01-03 VITALS — BP 138/81 | HR 85 | Ht 62.0 in | Wt 148.0 lb

## 2018-01-03 DIAGNOSIS — R399 Unspecified symptoms and signs involving the genitourinary system: Secondary | ICD-10-CM | POA: Diagnosis not present

## 2018-01-03 LAB — URINALYSIS
Bilirubin, UA: NEGATIVE
Glucose, UA: NEGATIVE
Ketones, UA: NEGATIVE
NITRITE UA: NEGATIVE
PH UA: 7 (ref 5.0–7.5)
PROTEIN UA: NEGATIVE
Specific Gravity, UA: 1.015 (ref 1.005–1.030)
Urobilinogen, Ur: 1 mg/dL (ref 0.2–1.0)

## 2018-01-03 MED ORDER — CIPROFLOXACIN HCL 250 MG PO TABS: 250.0000 mg | ORAL_TABLET | Freq: Two times a day (BID) | ORAL | 0 refills | Status: DC

## 2018-01-03 NOTE — Progress Notes (Signed)
Chief Complaint  Patient presents with  . Urinary Tract Infection    HPI  Patient presents today for burning with urination and frequency onset overnight.. Denies fever . No flank pain. No nausea, vomiting.   PMH: Smoking status noted ROS: Per HPI  Objective: BP 138/81   Pulse 85   Ht 5\' 2"  (1.575 m)   Wt 148 lb (67.1 kg)   BMI 27.07 kg/m  Gen: NAD, alert, cooperative with exam HEENT: NCAT, EOMI, PERRL CV: RRR, good S1/S2, no murmur Resp: CTABL, no wheezes, non-labored Abd: SNTND, BS present, no guarding or organomegaly Ext: No edema, warm Neuro: Alert and oriented, No gross deficits  Assessment and plan:  1. UTI symptoms     Meds ordered this encounter  Medications  . ciprofloxacin (CIPRO) 250 MG tablet    Sig: Take 1 tablet (250 mg total) by mouth 2 (two) times daily.    Dispense:  14 tablet    Refill:  0    Orders Placed This Encounter  Procedures  . Urine Culture  . Urinalysis    Follow up as needed.  [name], MD

## 2018-01-06 ENCOUNTER — Telehealth: Payer: Self-pay | Admitting: Family Medicine

## 2018-01-06 NOTE — Telephone Encounter (Signed)
FYI, Patient is having back and hip pain and is unsure if this is coming from her UTI or her DDD.  She is taking one Tylenol daily.  I advised her to take two Tylenol every 6 hours.  Informed her that we are waiting on the results of her urine culture to determine if Cipro will cover the bacteria in her urine and will call her with results.  Patient agrees and said she will make her decision about seeing her chiropractor after that.

## 2018-01-06 NOTE — Telephone Encounter (Signed)
Pt had appt Friday. Her Urine was checked and told she had blood in it. She was given Cipro and takes 2 a day. Pt is now c/o lower back pain and right hip pain. She does not know if it is from her UTI or if it is her back problems that [facility] is aware about. She sees a Restaurant manager, fast food and wants to know if she should see him. Wants advise from [facility] nurse. Please advise.

## 2018-01-07 LAB — URINE CULTURE

## 2018-01-08 ENCOUNTER — Telehealth: Payer: Self-pay | Admitting: *Deleted

## 2018-01-08 DIAGNOSIS — M47816 Spondylosis without myelopathy or radiculopathy, lumbar region: Secondary | ICD-10-CM | POA: Diagnosis not present

## 2018-01-08 DIAGNOSIS — M9903 Segmental and somatic dysfunction of lumbar region: Secondary | ICD-10-CM | POA: Diagnosis not present

## 2018-01-08 DIAGNOSIS — M5442 Lumbago with sciatica, left side: Secondary | ICD-10-CM | POA: Diagnosis not present

## 2018-01-08 NOTE — Telephone Encounter (Signed)
-----   Message from [name], MD sent at 01/07/2018  5:34 PM EST ----- Your urine culture confirmed the presence of an infection. The medication that was prescribed should help.  Best regards, [name]

## 2018-01-08 NOTE — Telephone Encounter (Signed)
Pt notified of results Verbalizes understanding 

## 2018-01-10 DIAGNOSIS — M9903 Segmental and somatic dysfunction of lumbar region: Secondary | ICD-10-CM | POA: Diagnosis not present

## 2018-01-10 DIAGNOSIS — M5442 Lumbago with sciatica, left side: Secondary | ICD-10-CM | POA: Diagnosis not present

## 2018-01-10 DIAGNOSIS — M47816 Spondylosis without myelopathy or radiculopathy, lumbar region: Secondary | ICD-10-CM | POA: Diagnosis not present

## 2018-01-14 DIAGNOSIS — M9903 Segmental and somatic dysfunction of lumbar region: Secondary | ICD-10-CM | POA: Diagnosis not present

## 2018-01-14 DIAGNOSIS — M47816 Spondylosis without myelopathy or radiculopathy, lumbar region: Secondary | ICD-10-CM | POA: Diagnosis not present

## 2018-01-14 DIAGNOSIS — M5442 Lumbago with sciatica, left side: Secondary | ICD-10-CM | POA: Diagnosis not present

## 2018-01-20 DIAGNOSIS — M5442 Lumbago with sciatica, left side: Secondary | ICD-10-CM | POA: Diagnosis not present

## 2018-01-20 DIAGNOSIS — M9903 Segmental and somatic dysfunction of lumbar region: Secondary | ICD-10-CM | POA: Diagnosis not present

## 2018-01-20 DIAGNOSIS — M47816 Spondylosis without myelopathy or radiculopathy, lumbar region: Secondary | ICD-10-CM | POA: Diagnosis not present

## 2018-01-30 DIAGNOSIS — M5442 Lumbago with sciatica, left side: Secondary | ICD-10-CM | POA: Diagnosis not present

## 2018-01-30 DIAGNOSIS — M9903 Segmental and somatic dysfunction of lumbar region: Secondary | ICD-10-CM | POA: Diagnosis not present

## 2018-01-30 DIAGNOSIS — M47816 Spondylosis without myelopathy or radiculopathy, lumbar region: Secondary | ICD-10-CM | POA: Diagnosis not present

## 2018-02-06 DIAGNOSIS — M47816 Spondylosis without myelopathy or radiculopathy, lumbar region: Secondary | ICD-10-CM | POA: Diagnosis not present

## 2018-02-06 DIAGNOSIS — M9903 Segmental and somatic dysfunction of lumbar region: Secondary | ICD-10-CM | POA: Diagnosis not present

## 2018-02-06 DIAGNOSIS — M5442 Lumbago with sciatica, left side: Secondary | ICD-10-CM | POA: Diagnosis not present

## 2018-02-27 DIAGNOSIS — M9903 Segmental and somatic dysfunction of lumbar region: Secondary | ICD-10-CM | POA: Diagnosis not present

## 2018-02-27 DIAGNOSIS — M5442 Lumbago with sciatica, left side: Secondary | ICD-10-CM | POA: Diagnosis not present

## 2018-02-27 DIAGNOSIS — M47816 Spondylosis without myelopathy or radiculopathy, lumbar region: Secondary | ICD-10-CM | POA: Diagnosis not present

## 2018-03-17 ENCOUNTER — Ambulatory Visit: Payer: Medicare Other | Admitting: *Deleted

## 2018-03-20 DIAGNOSIS — M47816 Spondylosis without myelopathy or radiculopathy, lumbar region: Secondary | ICD-10-CM | POA: Diagnosis not present

## 2018-03-20 DIAGNOSIS — M5442 Lumbago with sciatica, left side: Secondary | ICD-10-CM | POA: Diagnosis not present

## 2018-03-20 DIAGNOSIS — M9903 Segmental and somatic dysfunction of lumbar region: Secondary | ICD-10-CM | POA: Diagnosis not present

## 2018-03-22 ENCOUNTER — Other Ambulatory Visit: Payer: Self-pay | Admitting: Family Medicine

## 2018-03-31 ENCOUNTER — Other Ambulatory Visit: Payer: Self-pay | Admitting: Family Medicine

## 2018-04-14 ENCOUNTER — Encounter: Payer: Self-pay | Admitting: Family Medicine

## 2018-04-14 ENCOUNTER — Ambulatory Visit (INDEPENDENT_AMBULATORY_CARE_PROVIDER_SITE_OTHER): Payer: Medicare Other | Admitting: Family Medicine

## 2018-04-14 VITALS — BP 134/80 | HR 70 | Ht 62.0 in | Wt 147.4 lb

## 2018-04-14 DIAGNOSIS — E038 Other specified hypothyroidism: Secondary | ICD-10-CM | POA: Diagnosis not present

## 2018-04-14 DIAGNOSIS — M150 Primary generalized (osteo)arthritis: Secondary | ICD-10-CM

## 2018-04-14 DIAGNOSIS — E782 Mixed hyperlipidemia: Secondary | ICD-10-CM | POA: Diagnosis not present

## 2018-04-14 DIAGNOSIS — Z23 Encounter for immunization: Secondary | ICD-10-CM

## 2018-04-14 DIAGNOSIS — M159 Polyosteoarthritis, unspecified: Secondary | ICD-10-CM

## 2018-04-14 NOTE — Progress Notes (Signed)
Subjective:  Patient ID: Megan Chapman, female    DOB: 11-29-1931  Age: 84 y.o. MRN: [id number]  CC: Follow-up (pt here today for routine follow up of her chronic medical conditions)   HPI Megan Chapman presents for patient presents for follow-up on  thyroid. The patient has a history of hypothyroidism for many years. It has been stable recently. Pt. denies any change in  voice, loss of hair, heat or cold intolerance. Energy level has been adequate to good. Patient denies constipation and diarrhea. No myxedema. Medication is as noted below. Verified that pt is taking it daily on an empty stomach. Well tolerated. Patient in for follow-up of elevated cholesterol. Doing well without complaints on current medication. Denies side effects of statin including myalgia and arthralgia and nausea. Also in today for liver function testing. Currently no chest pain, shortness of breath or other cardiovascular related symptoms noted.  Patient is rather reluctant about its effect on her liver.  She however is willing to continue.  She says her back pain is getting better.  She has been to a chiropractor and that seems to have relieved her pain.  She understands that back pain from Lipitor would not be significantly alleviated by chiropractic care.  Patient also has some arthritic pain some stiffness at times she takes Tylenol arthritis.  That seems to give her adequate relief.  This affects multiple joints but it is not enough for her to consider a prescription medication at this time.  She is able to go about her usual routine without significant interference.  Depression screen [facility] 2/9 04/14/2018 10/14/2017 04/17/2017  Decreased Interest 0 0 0  Down, Depressed, Hopeless 0 0 0  PHQ - 2 Score 0 0 0  Altered sleeping - - -  Tired, decreased energy - - -  Change in appetite - - -  Feeling bad or failure about yourself  - - -  Trouble concentrating - - -  Moving slowly or fidgety/restless - - -  Suicidal thoughts -  - -  PHQ-9 Score - - -  Difficult doing work/chores - - -    History Megan Chapman has a past medical history of Anxiety, Asthma, Basal cell carcinoma, Cataract, GERD (gastroesophageal reflux disease), Hyperlipidemia, Migraine, and Thyroid disease.   She has a past surgical history that includes Partial knee arthroplasty (Left); Abdominal hysterectomy; Meniscus repair (Right); Excision basal cell carcinoma (2017); Cataract extraction (Bilateral); Appendectomy; and Tonsillectomy.   Her family history includes COPD in her son; Cancer in her brother, father, and mother; Down syndrome in her daughter; Migraines in her brother and son; Stroke in her brother.She reports that she has never smoked. She has never used smokeless tobacco. She reports that she does not drink alcohol or use drugs.    ROS Review of Systems  Constitutional: Negative.   HENT: Negative for congestion.   Eyes: Negative for visual disturbance.  Respiratory: Negative for shortness of breath.   Cardiovascular: Negative for chest pain.  Gastrointestinal: Negative for abdominal pain, constipation, diarrhea, nausea and vomiting.  Genitourinary: Negative for difficulty urinating.  Musculoskeletal: Positive for arthralgias, back pain and myalgias.  Neurological: Negative for headaches.  Psychiatric/Behavioral: Negative for sleep disturbance.    Objective:  BP 134/80   Pulse 70   Ht _0  (1.575 m)   Wt 147 lb 6 oz (66.8 kg)   BMI 26.96 kg/m   BP Readings from Last 3 Encounters:  04/14/18 134/80  01/03/18 138/81  10/28/17 126/70    Wt  Readings from Last 3 Encounters:  04/14/18 147 lb 6 oz (66.8 kg)  01/03/18 148 lb (67.1 kg)  10/28/17 148 lb (67.1 kg)     Physical Exam  Constitutional: She is oriented to person, place, and time. She appears well-developed and well-nourished. No distress.  HENT:  Head: Normocephalic and atraumatic.  Right Ear: External ear normal.  Left Ear: External ear normal.  Neck: Normal  range of motion. Neck supple.  Cardiovascular: Normal rate, regular rhythm and normal heart sounds.  No murmur heard. Pulmonary/Chest: Effort normal and breath sounds normal. No respiratory distress. She has no wheezes. She has no rales.  Abdominal: Soft. There is no tenderness.  Musculoskeletal: Normal range of motion. She exhibits no edema or tenderness.  Neurological: She is alert and oriented to person, place, and time. She exhibits normal muscle tone. Coordination normal.  Skin: Skin is warm and dry.  Psychiatric: She has a normal mood and affect. Her behavior is normal.      Assessment & Plan:   Megan Chapman was seen today for follow-up.  Diagnoses and all orders for this visit:  Other specified hypothyroidism -     CBC with Differential/Platelet -     CMP14+EGFR -     TSH + free T4  Mixed hyperlipidemia -     Lipid panel  Primary osteoarthritis involving multiple joints       I have discontinued Megan Chapman's ciprofloxacin. I am also having her maintain her aspirin, Vitamin D3, Fish Oil, atorvastatin, ALPRAZolam, and levothyroxine.  Allergies as of 04/14/2018      Reactions   Amoxicillin    Biaxin [clarithromycin]    Codeine    Nitrofurantoin Nausea And Vomiting, Nausea Only   Sulfa Antibiotics       Medication List        Accurate as of 04/14/18 12:11 PM. Always use your most recent med list.          ALPRAZolam 0.5 MG tablet Commonly known as:  XANAX Take 1 tablet (0.5 mg total) by mouth at bedtime as needed. for sleep   aspirin 81 MG tablet Take 81 mg by mouth daily.   atorvastatin 40 MG tablet Commonly known as:  LIPITOR Take 1 tablet (40 mg total) by mouth daily.   Fish Oil 1200 MG Caps Take 2 capsules by mouth daily.   levothyroxine 25 MCG tablet Commonly known as:  SYNTHROID, LEVOTHROID TAKE (1) TABLET DAILY BE- FORE BREAKFAST.   Vitamin D3 1000 units Caps Take 2 capsules (2,000 Units total) by mouth daily.        Follow-up: Return in  about 6 months (around 10/14/2018).  [name], M.D.

## 2018-04-14 NOTE — Addendum Note (Signed)
Addended by: Marylin Crosby on: 04/14/2018 05:03 PM   Modules accepted: Orders

## 2018-04-15 LAB — LIPID PANEL
CHOL/HDL RATIO: 2.9 ratio (ref 0.0–4.4)
Cholesterol, Total: 188 mg/dL (ref 100–199)
HDL: 64 mg/dL (ref >39)
LDL CALC: 93 mg/dL (ref 0–99)
TRIGLYCERIDES: 156 mg/dL — AB (ref 0–149)
VLDL Cholesterol Cal: 31 mg/dL (ref 5–40)

## 2018-04-15 LAB — CBC WITH DIFFERENTIAL/PLATELET
BASOS: 0 %
Basophils Absolute: 0 10*3/uL (ref 0.0–0.2)
EOS (ABSOLUTE): 0.1 10*3/uL (ref 0.0–0.4)
Eos: 2 %
HEMATOCRIT: 38.4 % (ref 34.0–46.6)
HEMOGLOBIN: 12.8 g/dL (ref 11.1–15.9)
IMMATURE GRANS (ABS): 0 10*3/uL (ref 0.0–0.1)
Immature Granulocytes: 0 %
LYMPHS: 43 %
Lymphocytes Absolute: 1.2 10*3/uL (ref 0.7–3.1)
MCH: 29 pg (ref 26.6–33.0)
MCHC: 33.3 g/dL (ref 31.5–35.7)
MCV: 87 fL (ref 79–97)
MONOCYTES: 14 %
Monocytes Absolute: 0.4 10*3/uL (ref 0.1–0.9)
NEUTROS ABS: 1.2 10*3/uL — AB (ref 1.4–7.0)
Neutrophils: 41 %
Platelets: 254 10*3/uL (ref 150–379)
RBC: 4.42 x10E6/uL (ref 3.77–5.28)
RDW: 13.8 % (ref 12.3–15.4)
WBC: 2.8 10*3/uL — ABNORMAL LOW (ref 3.4–10.8)

## 2018-04-15 LAB — CMP14+EGFR
A/G RATIO: 1.8 (ref 1.2–2.2)
ALBUMIN: 4.6 g/dL (ref 3.5–4.7)
ALT: 12 IU/L (ref 0–32)
AST: 17 IU/L (ref 0–40)
Alkaline Phosphatase: 61 IU/L (ref 39–117)
BILIRUBIN TOTAL: 0.5 mg/dL (ref 0.0–1.2)
BUN / CREAT RATIO: 17 (ref 12–28)
BUN: 14 mg/dL (ref 8–27)
CHLORIDE: 103 mmol/L (ref 96–106)
CO2: 20 mmol/L (ref 20–29)
Calcium: 10 mg/dL (ref 8.7–10.3)
Creatinine, Ser: 0.83 mg/dL (ref 0.57–1.00)
GFR calc non Af Amer: 65 mL/min/{1.73_m2} (ref >59)
GFR, EST AFRICAN AMERICAN: 75 mL/min/{1.73_m2} (ref >59)
Globulin, Total: 2.5 g/dL (ref 1.5–4.5)
Glucose: 90 mg/dL (ref 65–99)
Potassium: 4.7 mmol/L (ref 3.5–5.2)
Sodium: 143 mmol/L (ref 134–144)
TOTAL PROTEIN: 7.1 g/dL (ref 6.0–8.5)

## 2018-04-15 LAB — TSH+FREE T4
Free T4: 1.23 ng/dL (ref 0.82–1.77)
TSH: 3.59 u[IU]/mL (ref 0.450–4.500)

## 2018-05-12 DIAGNOSIS — H353121 Nonexudative age-related macular degeneration, left eye, early dry stage: Secondary | ICD-10-CM | POA: Diagnosis not present

## 2018-05-12 DIAGNOSIS — H527 Unspecified disorder of refraction: Secondary | ICD-10-CM | POA: Diagnosis not present

## 2018-05-12 DIAGNOSIS — D481 Neoplasm of uncertain behavior of connective and other soft tissue: Secondary | ICD-10-CM | POA: Diagnosis not present

## 2018-05-12 DIAGNOSIS — H02839 Dermatochalasis of unspecified eye, unspecified eyelid: Secondary | ICD-10-CM | POA: Diagnosis not present

## 2018-05-12 DIAGNOSIS — H26492 Other secondary cataract, left eye: Secondary | ICD-10-CM | POA: Diagnosis not present

## 2018-06-27 ENCOUNTER — Other Ambulatory Visit: Payer: Self-pay | Admitting: Family Medicine

## 2018-08-28 DIAGNOSIS — M79671 Pain in right foot: Secondary | ICD-10-CM | POA: Diagnosis not present

## 2018-08-28 DIAGNOSIS — L03121 Acute lymphangitis of right axilla: Secondary | ICD-10-CM | POA: Diagnosis not present

## 2018-09-18 ENCOUNTER — Other Ambulatory Visit: Payer: Self-pay | Admitting: Family Medicine

## 2018-09-25 ENCOUNTER — Other Ambulatory Visit: Payer: Self-pay | Admitting: Family Medicine

## 2018-09-25 DIAGNOSIS — L03031 Cellulitis of right toe: Secondary | ICD-10-CM | POA: Diagnosis not present

## 2018-09-25 DIAGNOSIS — M79672 Pain in left foot: Secondary | ICD-10-CM | POA: Diagnosis not present

## 2018-10-14 ENCOUNTER — Encounter: Payer: Self-pay | Admitting: Family Medicine

## 2018-10-14 ENCOUNTER — Ambulatory Visit (INDEPENDENT_AMBULATORY_CARE_PROVIDER_SITE_OTHER): Payer: Medicare Other | Admitting: Family Medicine

## 2018-10-14 VITALS — BP 132/81 | HR 64 | Temp 98.4°F | Ht 62.0 in | Wt 150.6 lb

## 2018-10-14 DIAGNOSIS — M150 Primary generalized (osteo)arthritis: Secondary | ICD-10-CM | POA: Diagnosis not present

## 2018-10-14 DIAGNOSIS — Z23 Encounter for immunization: Secondary | ICD-10-CM

## 2018-10-14 DIAGNOSIS — M159 Polyosteoarthritis, unspecified: Secondary | ICD-10-CM

## 2018-10-14 DIAGNOSIS — E038 Other specified hypothyroidism: Secondary | ICD-10-CM | POA: Diagnosis not present

## 2018-10-14 DIAGNOSIS — E039 Hypothyroidism, unspecified: Secondary | ICD-10-CM | POA: Diagnosis not present

## 2018-10-14 DIAGNOSIS — E782 Mixed hyperlipidemia: Secondary | ICD-10-CM

## 2018-10-14 DIAGNOSIS — E559 Vitamin D deficiency, unspecified: Secondary | ICD-10-CM | POA: Diagnosis not present

## 2018-10-14 NOTE — Progress Notes (Signed)
Subjective:  Patient ID: Megan Chapman, female    DOB: [birth year]  Age: 84 y.o. MRN: [id number]  CC: Medical Management of Chronic Issues   HPI Megan Chapman presents for recheck of anxiety. Son with terminal lung Ca.  Still misses her other son who died a rew years ago. Very anxious, upset. Symptoms increasing since son's cancer dx. Tearful at times.   Patient presents for follow-up on  thyroid. The patient has a history of hypothyroidism for many years. It has been stable recently. Pt. denies any change in  voice, loss of hair, heat or cold intolerance. Energy level has been adequate to good. Patient denies constipation and diarrhea. No myxedema. Medication is as noted below. Verified that pt is taking it daily on an empty stomach. Well tolerated.  Patient in for follow-up of elevated cholesterol. Doing well without complaints on current medication. Denies side effects of statin including myalgia and arthralgia and nausea. Also in today for liver function testing. Currently no chest pain, shortness of breath or other cardiovascular related symptoms noted.  Depression screen [facility] 2/9 10/14/2018 04/14/2018 10/14/2017  Decreased Interest 0 0 0  Down, Depressed, Hopeless 0 0 0  PHQ - 2 Score 0 0 0  Altered sleeping - - -  Tired, decreased energy - - -  Change in appetite - - -  Feeling bad or failure about yourself  - - -  Trouble concentrating - - -  Moving slowly or fidgety/restless - - -  Suicidal thoughts - - -  PHQ-9 Score - - -  Difficult doing work/chores - - -    History Megan Chapman has a past medical history of Anxiety, Asthma, Basal cell carcinoma, Cataract, GERD (gastroesophageal reflux disease), Hyperlipidemia, Migraine, and Thyroid disease.   She has a past surgical history that includes Partial knee arthroplasty (Left); Abdominal hysterectomy; Meniscus repair (Right); Excision basal cell carcinoma (2017); Cataract extraction (Bilateral); Appendectomy; and Tonsillectomy.   Her  family history includes COPD in her son; Cancer in her brother, father, and mother; Down syndrome in her daughter; Migraines in her brother and son; Stroke in her brother.She reports that she has never smoked. She has never used smokeless tobacco. She reports that she does not drink alcohol or use drugs.    ROS Review of Systems  Constitutional: Negative.   HENT: Negative for congestion.   Eyes: Negative for visual disturbance.  Respiratory: Negative for shortness of breath.   Cardiovascular: Negative for chest pain.  Gastrointestinal: Negative for abdominal pain, constipation, diarrhea, nausea and vomiting.  Genitourinary: Negative for difficulty urinating.  Musculoskeletal: Positive for arthralgias (worst in hands). Negative for myalgias.  Neurological: Negative for seizures and headaches.  Psychiatric/Behavioral: Positive for decreased concentration and dysphoric mood. Negative for sleep disturbance. The patient is nervous/anxious.     Objective:  BP 132/81   Pulse 64   Temp 98.4 F (36.9 C) (Oral)   Ht '5\' 2"'  (1.575 m)   Wt 150 lb 9.6 oz (68.3 kg)   BMI 27.55 kg/m   BP Readings from Last 3 Encounters:  10/14/18 132/81  04/14/18 134/80  01/03/18 138/81    Wt Readings from Last 3 Encounters:  10/14/18 150 lb 9.6 oz (68.3 kg)  04/14/18 147 lb 6 oz (66.8 kg)  01/03/18 148 lb (67.1 kg)     Physical Exam  Constitutional: She is oriented to person, place, and time. She appears well-developed and well-nourished. No distress.  HENT:  Head: Normocephalic and atraumatic.  Right Ear: External ear normal.  Left Ear: External ear normal.  Nose: Nose normal.  Mouth/Throat: Oropharynx is clear and moist.  Eyes: Pupils are equal, round, and reactive to light. Conjunctivae and EOM are normal.  Neck: Normal range of motion. Neck supple. No thyromegaly present.  Cardiovascular: Normal rate, regular rhythm and normal heart sounds.  No murmur heard. Pulmonary/Chest: Effort normal  and breath sounds normal. No respiratory distress. She has no wheezes. She has no rales.  Abdominal: Soft. Bowel sounds are normal. She exhibits no distension. There is no tenderness.  Lymphadenopathy:    She has no cervical adenopathy.  Neurological: She is alert and oriented to person, place, and time. She has normal reflexes.  Skin: Skin is warm and dry.  Psychiatric: She has a normal mood and affect. Her behavior is normal. Judgment and thought content normal.      Assessment & Plan:   Megan Chapman was seen today for medical management of chronic issues.  Diagnoses and all orders for this visit:  Mixed hyperlipidemia -     CBC with Differential/Platelet -     CMP14+EGFR -     Lipid panel  Other specified hypothyroidism -     CBC with Differential/Platelet -     CMP14+EGFR -     Lipid panel  Primary osteoarthritis involving multiple joints -     CBC with Differential/Platelet -     CMP14+EGFR -     Lipid panel  Acquired hypothyroidism -     CBC with Differential/Platelet -     CMP14+EGFR -     Thyroid Panel With TSH -     Lipid panel  Vitamin D deficiency -     CBC with Differential/Platelet -     CMP14+EGFR -     Lipid panel -     VITAMIN D 25 Hydroxy (Vit-D Deficiency, Fractures)  Encounter for immunization -     Flu vaccine HIGH DOSE PF  Other orders -     atorvastatin (LIPITOR) 40 MG tablet; Take 1 tablet (40 mg total) by mouth daily. -     ALPRAZolam (XANAX) 0.5 MG tablet; Take 1 tablet (0.5 mg total) by mouth at bedtime as needed. for sleep       I have changed Megan Chapman's atorvastatin. I am also having her maintain her aspirin, Vitamin D3, Fish Oil, levothyroxine, and ALPRAZolam.  Allergies as of 10/14/2018      Reactions   Amoxicillin    Biaxin [clarithromycin]    Codeine    Nitrofurantoin Nausea And Vomiting, Nausea Only   Sulfa Antibiotics       Medication List        Accurate as of 10/14/18 11:59 PM. Always use your most recent med list.            ALPRAZolam 0.5 MG tablet Commonly known as:  XANAX Take 1 tablet (0.5 mg total) by mouth at bedtime as needed. for sleep   aspirin 81 MG tablet Take 81 mg by mouth daily.   atorvastatin 40 MG tablet Commonly known as:  LIPITOR Take 1 tablet (40 mg total) by mouth daily.   Fish Oil 1200 MG Caps Take 2 capsules by mouth daily.   levothyroxine 25 MCG tablet Commonly known as:  SYNTHROID, LEVOTHROID TAKE (1) TABLET DAILY BE- FORE BREAKFAST.   Vitamin D3 1000 units Caps Take 2 capsules (2,000 Units total) by mouth daily.        Follow-up: No follow-ups on file.  [name], M.D.

## 2018-10-15 LAB — THYROID PANEL WITH TSH
Free Thyroxine Index: 1.7 (ref 1.2–4.9)
T3 UPTAKE RATIO: 28 % (ref 24–39)
T4, Total: 6.2 ug/dL (ref 4.5–12.0)
TSH: 3.34 u[IU]/mL (ref 0.450–4.500)

## 2018-10-15 LAB — CBC WITH DIFFERENTIAL/PLATELET
Basophils Absolute: 0 10*3/uL (ref 0.0–0.2)
Basos: 0 %
EOS (ABSOLUTE): 0.1 10*3/uL (ref 0.0–0.4)
EOS: 2 %
HEMOGLOBIN: 12 g/dL (ref 11.1–15.9)
Hematocrit: 37.5 % (ref 34.0–46.6)
IMMATURE GRANULOCYTES: 1 %
Immature Grans (Abs): 0 10*3/uL (ref 0.0–0.1)
LYMPHS ABS: 1.2 10*3/uL (ref 0.7–3.1)
Lymphs: 38 %
MCH: 28.7 pg (ref 26.6–33.0)
MCHC: 32 g/dL (ref 31.5–35.7)
MCV: 90 fL (ref 79–97)
MONOCYTES: 11 %
Monocytes Absolute: 0.4 10*3/uL (ref 0.1–0.9)
Neutrophils Absolute: 1.5 10*3/uL (ref 1.4–7.0)
Neutrophils: 48 %
Platelets: 237 10*3/uL (ref 150–450)
RBC: 4.18 x10E6/uL (ref 3.77–5.28)
RDW: 13.3 % (ref 12.3–15.4)
WBC: 3.2 10*3/uL — AB (ref 3.4–10.8)

## 2018-10-15 LAB — CMP14+EGFR
ALBUMIN: 4.3 g/dL (ref 3.5–4.7)
ALT: 16 IU/L (ref 0–32)
AST: 15 IU/L (ref 0–40)
Albumin/Globulin Ratio: 1.7 (ref 1.2–2.2)
Alkaline Phosphatase: 57 IU/L (ref 39–117)
BUN / CREAT RATIO: 17 (ref 12–28)
BUN: 12 mg/dL (ref 8–27)
Bilirubin Total: 0.4 mg/dL (ref 0.0–1.2)
CALCIUM: 10.1 mg/dL (ref 8.7–10.3)
CO2: 25 mmol/L (ref 20–29)
CREATININE: 0.72 mg/dL (ref 0.57–1.00)
Chloride: 103 mmol/L (ref 96–106)
GFR calc Af Amer: 88 mL/min/{1.73_m2} (ref >59)
GFR, EST NON AFRICAN AMERICAN: 77 mL/min/{1.73_m2} (ref >59)
GLOBULIN, TOTAL: 2.5 g/dL (ref 1.5–4.5)
Glucose: 90 mg/dL (ref 65–99)
Potassium: 4.9 mmol/L (ref 3.5–5.2)
SODIUM: 142 mmol/L (ref 134–144)
Total Protein: 6.8 g/dL (ref 6.0–8.5)

## 2018-10-15 LAB — LIPID PANEL
CHOL/HDL RATIO: 2.7 ratio (ref 0.0–4.4)
Cholesterol, Total: 167 mg/dL (ref 100–199)
HDL: 63 mg/dL (ref >39)
LDL Calculated: 77 mg/dL (ref 0–99)
TRIGLYCERIDES: 134 mg/dL (ref 0–149)
VLDL CHOLESTEROL CAL: 27 mg/dL (ref 5–40)

## 2018-10-15 LAB — VITAMIN D 25 HYDROXY (VIT D DEFICIENCY, FRACTURES): Vit D, 25-Hydroxy: 50.2 ng/mL (ref 30.0–100.0)

## 2018-10-15 NOTE — Progress Notes (Addendum)
Hello Vibha,  Your lab result is normal.Some minor variations that are not significant are commonly marked abnormal, but do not represent any medical problem for you.  Best regards, [name], M.D.

## 2018-10-26 ENCOUNTER — Encounter: Payer: Self-pay | Admitting: Family Medicine

## 2018-10-26 MED ORDER — ATORVASTATIN CALCIUM 40 MG PO TABS: 40.0000 mg | ORAL_TABLET | Freq: Every day | ORAL | 3 refills | Status: DC

## 2018-10-26 MED ORDER — ALPRAZOLAM 0.5 MG PO TABS: 0.5000 mg | ORAL_TABLET | Freq: Every evening | ORAL | 5 refills | Status: DC | PRN

## 2018-12-09 DIAGNOSIS — I70203 Unspecified atherosclerosis of native arteries of extremities, bilateral legs: Secondary | ICD-10-CM | POA: Diagnosis not present

## 2018-12-09 DIAGNOSIS — L84 Corns and callosities: Secondary | ICD-10-CM | POA: Diagnosis not present

## 2018-12-09 DIAGNOSIS — B351 Tinea unguium: Secondary | ICD-10-CM | POA: Diagnosis not present

## 2018-12-09 DIAGNOSIS — M79676 Pain in unspecified toe(s): Secondary | ICD-10-CM | POA: Diagnosis not present

## 2019-01-29 DIAGNOSIS — C44311 Basal cell carcinoma of skin of nose: Secondary | ICD-10-CM | POA: Diagnosis not present

## 2019-01-29 DIAGNOSIS — C44321 Squamous cell carcinoma of skin of nose: Secondary | ICD-10-CM | POA: Diagnosis not present

## 2019-01-29 DIAGNOSIS — L570 Actinic keratosis: Secondary | ICD-10-CM | POA: Diagnosis not present

## 2019-01-29 DIAGNOSIS — X32XXXD Exposure to sunlight, subsequent encounter: Secondary | ICD-10-CM | POA: Diagnosis not present

## 2019-01-29 DIAGNOSIS — L820 Inflamed seborrheic keratosis: Secondary | ICD-10-CM | POA: Diagnosis not present

## 2019-03-03 DIAGNOSIS — C44311 Basal cell carcinoma of skin of nose: Secondary | ICD-10-CM | POA: Diagnosis not present

## 2019-03-06 DIAGNOSIS — C44311 Basal cell carcinoma of skin of nose: Secondary | ICD-10-CM | POA: Diagnosis not present

## 2019-03-06 DIAGNOSIS — Z481 Encounter for planned postprocedural wound closure: Secondary | ICD-10-CM | POA: Diagnosis not present

## 2019-03-06 DIAGNOSIS — M950 Acquired deformity of nose: Secondary | ICD-10-CM | POA: Diagnosis not present

## 2019-03-06 DIAGNOSIS — Z01818 Encounter for other preprocedural examination: Secondary | ICD-10-CM | POA: Diagnosis not present

## 2019-03-06 DIAGNOSIS — G4700 Insomnia, unspecified: Secondary | ICD-10-CM | POA: Diagnosis not present

## 2019-03-06 DIAGNOSIS — E785 Hyperlipidemia, unspecified: Secondary | ICD-10-CM | POA: Diagnosis not present

## 2019-03-06 DIAGNOSIS — M1990 Unspecified osteoarthritis, unspecified site: Secondary | ICD-10-CM | POA: Diagnosis not present

## 2019-03-06 DIAGNOSIS — E039 Hypothyroidism, unspecified: Secondary | ICD-10-CM | POA: Diagnosis not present

## 2019-03-06 DIAGNOSIS — Z79899 Other long term (current) drug therapy: Secondary | ICD-10-CM | POA: Diagnosis not present

## 2019-03-06 DIAGNOSIS — Z428 Encounter for other plastic and reconstructive surgery following medical procedure or healed injury: Secondary | ICD-10-CM | POA: Diagnosis not present

## 2019-03-06 DIAGNOSIS — E7800 Pure hypercholesterolemia, unspecified: Secondary | ICD-10-CM | POA: Diagnosis not present

## 2019-03-06 DIAGNOSIS — Z9889 Other specified postprocedural states: Secondary | ICD-10-CM | POA: Diagnosis not present

## 2019-03-06 DIAGNOSIS — Z85828 Personal history of other malignant neoplasm of skin: Secondary | ICD-10-CM | POA: Diagnosis not present

## 2019-03-09 DIAGNOSIS — Z85828 Personal history of other malignant neoplasm of skin: Secondary | ICD-10-CM | POA: Diagnosis not present

## 2019-03-09 DIAGNOSIS — M950 Acquired deformity of nose: Secondary | ICD-10-CM | POA: Diagnosis not present

## 2019-03-09 DIAGNOSIS — E039 Hypothyroidism, unspecified: Secondary | ICD-10-CM | POA: Diagnosis not present

## 2019-03-09 DIAGNOSIS — Z428 Encounter for other plastic and reconstructive surgery following medical procedure or healed injury: Secondary | ICD-10-CM | POA: Diagnosis not present

## 2019-03-09 DIAGNOSIS — Z481 Encounter for planned postprocedural wound closure: Secondary | ICD-10-CM | POA: Diagnosis not present

## 2019-03-09 DIAGNOSIS — Z9889 Other specified postprocedural states: Secondary | ICD-10-CM | POA: Diagnosis not present

## 2019-03-09 DIAGNOSIS — C44311 Basal cell carcinoma of skin of nose: Secondary | ICD-10-CM | POA: Diagnosis not present

## 2019-03-10 DIAGNOSIS — Z9889 Other specified postprocedural states: Secondary | ICD-10-CM | POA: Diagnosis not present

## 2019-03-10 DIAGNOSIS — Z85828 Personal history of other malignant neoplasm of skin: Secondary | ICD-10-CM | POA: Diagnosis not present

## 2019-03-10 DIAGNOSIS — Z428 Encounter for other plastic and reconstructive surgery following medical procedure or healed injury: Secondary | ICD-10-CM | POA: Diagnosis not present

## 2019-03-10 DIAGNOSIS — M950 Acquired deformity of nose: Secondary | ICD-10-CM | POA: Diagnosis not present

## 2019-03-10 DIAGNOSIS — E039 Hypothyroidism, unspecified: Secondary | ICD-10-CM | POA: Diagnosis not present

## 2019-03-10 DIAGNOSIS — Z481 Encounter for planned postprocedural wound closure: Secondary | ICD-10-CM | POA: Diagnosis not present

## 2019-04-06 DIAGNOSIS — E039 Hypothyroidism, unspecified: Secondary | ICD-10-CM | POA: Diagnosis not present

## 2019-04-06 DIAGNOSIS — Z85828 Personal history of other malignant neoplasm of skin: Secondary | ICD-10-CM | POA: Diagnosis not present

## 2019-04-06 DIAGNOSIS — M950 Acquired deformity of nose: Secondary | ICD-10-CM | POA: Diagnosis not present

## 2019-04-06 DIAGNOSIS — Z481 Encounter for planned postprocedural wound closure: Secondary | ICD-10-CM | POA: Diagnosis not present

## 2019-04-06 DIAGNOSIS — Z428 Encounter for other plastic and reconstructive surgery following medical procedure or healed injury: Secondary | ICD-10-CM | POA: Diagnosis not present

## 2019-04-06 DIAGNOSIS — Z483 Aftercare following surgery for neoplasm: Secondary | ICD-10-CM | POA: Diagnosis not present

## 2019-04-15 ENCOUNTER — Ambulatory Visit: Payer: Medicare Other | Admitting: Family Medicine

## 2019-05-11 ENCOUNTER — Other Ambulatory Visit: Payer: Self-pay | Admitting: Family Medicine

## 2019-05-11 ENCOUNTER — Other Ambulatory Visit: Payer: Self-pay | Admitting: *Deleted

## 2019-05-11 MED ORDER — ALPRAZOLAM 0.5 MG PO TABS: 0.5000 mg | ORAL_TABLET | Freq: Every evening | ORAL | 0 refills | Status: DC | PRN

## 2019-05-11 NOTE — Telephone Encounter (Signed)
Has appointment 05/29/2019. Last filled 04/11/2019

## 2019-05-28 ENCOUNTER — Other Ambulatory Visit: Payer: Self-pay

## 2019-05-29 ENCOUNTER — Ambulatory Visit (INDEPENDENT_AMBULATORY_CARE_PROVIDER_SITE_OTHER): Payer: Medicare Other | Admitting: Family Medicine

## 2019-05-29 ENCOUNTER — Encounter: Payer: Self-pay | Admitting: Family Medicine

## 2019-05-29 VITALS — BP 153/69 | HR 77 | Temp 97.4°F | Ht 62.0 in | Wt 149.0 lb

## 2019-05-29 DIAGNOSIS — E039 Hypothyroidism, unspecified: Secondary | ICD-10-CM

## 2019-05-29 DIAGNOSIS — R002 Palpitations: Secondary | ICD-10-CM | POA: Diagnosis not present

## 2019-05-29 DIAGNOSIS — E038 Other specified hypothyroidism: Secondary | ICD-10-CM | POA: Diagnosis not present

## 2019-05-29 DIAGNOSIS — I10 Essential (primary) hypertension: Secondary | ICD-10-CM | POA: Diagnosis not present

## 2019-05-29 DIAGNOSIS — E782 Mixed hyperlipidemia: Secondary | ICD-10-CM

## 2019-05-29 LAB — URINALYSIS
Bilirubin, UA: NEGATIVE
Glucose, UA: NEGATIVE
Ketones, UA: NEGATIVE
Nitrite, UA: NEGATIVE
Protein,UA: NEGATIVE
Specific Gravity, UA: 1.025 (ref 1.005–1.030)
Urobilinogen, Ur: 1 mg/dL (ref 0.2–1.0)
pH, UA: 5.5 (ref 5.0–7.5)

## 2019-05-29 LAB — LIPID PANEL

## 2019-05-29 MED ORDER — LISINOPRIL 5 MG PO TABS: 5.0000 mg | ORAL_TABLET | Freq: Every day | ORAL | 1 refills | Status: DC

## 2019-05-29 MED ORDER — ATORVASTATIN CALCIUM 40 MG PO TABS: 40.0000 mg | ORAL_TABLET | Freq: Every day | ORAL | 1 refills | Status: DC

## 2019-05-29 MED ORDER — LEVOTHYROXINE SODIUM 25 MCG PO TABS: ORAL_TABLET | ORAL | 1 refills | Status: DC

## 2019-05-29 NOTE — Patient Instructions (Signed)
DASH DASH Eating Plan DASH stands for "Dietary Approaches to Stop Hypertension." The DASH eating plan is a healthy eating plan that has been shown to reduce high blood pressure (hypertension). It may also reduce your risk for type 2 diabetes, heart disease, and stroke. The DASH eating plan may also help with weight loss. What are tips for following this plan?  General guidelines  Avoid eating more than 2,300 mg (milligrams) of salt (sodium) a day. If you have hypertension, you may need to reduce your sodium intake to 1,500 mg a day.  Limit alcohol intake to no more than 1 drink a day for nonpregnant women and 2 drinks a day for men. One drink equals 12 oz of beer, 5 oz of wine, or 1 oz of hard liquor.  Work with your health care provider to maintain a healthy body weight or to lose weight. Ask what an ideal weight is for you.  Get at least 30 minutes of exercise that causes your heart to beat faster (aerobic exercise) most days of the week. Activities may include walking, swimming, or biking.  Work with your health care provider or diet and nutrition specialist (dietitian) to adjust your eating plan to your individual calorie needs. Reading food labels   Check food labels for the amount of sodium per serving. Choose foods with less than 5 percent of the Daily Value of sodium. Generally, foods with less than 300 mg of sodium per serving fit into this eating plan.  To find whole grains, look for the word "whole" as the first word in the ingredient list. Shopping  Buy products labeled as "low-sodium" or "no salt added."  Buy fresh foods. Avoid canned foods and premade or frozen meals. Cooking  Avoid adding salt when cooking. Use salt-free seasonings or herbs instead of table salt or sea salt. Check with your health care provider or pharmacist before using salt substitutes.  Do not fry foods. Cook foods using healthy methods such as baking, boiling, grilling, and broiling instead.  Cook  with heart-healthy oils, such as olive, canola, soybean, or sunflower oil. Meal planning  Eat a balanced diet that includes: ? 5 or more servings of fruits and vegetables each day. At each meal, try to fill half of your plate with fruits and vegetables. ? Up to 6-8 servings of whole grains each day. ? Less than 6 oz of lean meat, poultry, or fish each day. A 3-oz serving of meat is about the same size as a deck of cards. One egg equals 1 oz. ? 2 servings of low-fat dairy each day. ? A serving of nuts, seeds, or beans 5 times each week. ? Heart-healthy fats. Healthy fats called Omega-3 fatty acids are found in foods such as flaxseeds and coldwater fish, like sardines, salmon, and mackerel.  Limit how much you eat of the following: ? Canned or prepackaged foods. ? Food that is high in trans fat, such as fried foods. ? Food that is high in saturated fat, such as fatty meat. ? Sweets, desserts, sugary drinks, and other foods with added sugar. ? Full-fat dairy products.  Do not salt foods before eating.  Try to eat at least 2 vegetarian meals each week.  Eat more home-cooked food and less restaurant, buffet, and fast food.  When eating at a restaurant, ask that your food be prepared with less salt or no salt, if possible. What foods are recommended? The items listed may not be a complete list. Talk with your dietitian  about what dietary choices are best for you. Grains Whole-grain or whole-wheat bread. Whole-grain or whole-wheat pasta. Brown rice. [place]. Bulgur. Whole-grain and low-sodium cereals. Pita bread. Low-fat, low-sodium crackers. Whole-wheat flour tortillas. Vegetables Fresh or frozen vegetables (raw, steamed, roasted, or grilled). Low-sodium or reduced-sodium tomato and vegetable juice. Low-sodium or reduced-sodium tomato sauce and tomato paste. Low-sodium or reduced-sodium canned vegetables. Fruits All fresh, dried, or frozen fruit. Canned fruit in natural juice  (without added sugar). Meat and other protein foods Skinless chicken or [place]. Ground chicken or [place]. Pork with fat trimmed off. Fish and seafood. Egg whites. Dried beans, peas, or lentils. Unsalted nuts, nut butters, and seeds. Unsalted canned beans. Lean cuts of beef with fat trimmed off. Low-sodium, lean deli meat. Dairy Low-fat (1%) or fat-free (skim) milk. Fat-free, low-fat, or reduced-fat cheeses. Nonfat, low-sodium ricotta or cottage cheese. Low-fat or nonfat yogurt. Low-fat, low-sodium cheese. Fats and oils Soft margarine without trans fats. Vegetable oil. Low-fat, reduced-fat, or light mayonnaise and salad dressings (reduced-sodium). Canola, safflower, olive, soybean, and sunflower oils. Avocado. Seasoning and other foods Herbs. Spices. Seasoning mixes without salt. Unsalted popcorn and pretzels. Fat-free sweets. What foods are not recommended? The items listed may not be a complete list. Talk with your dietitian about what dietary choices are best for you. Grains Baked goods made with fat, such as croissants, muffins, or some breads. Dry pasta or rice meal packs. Vegetables Creamed or fried vegetables. Vegetables in a cheese sauce. Regular canned vegetables (not low-sodium or reduced-sodium). Regular canned tomato sauce and paste (not low-sodium or reduced-sodium). Regular tomato and vegetable juice (not low-sodium or reduced-sodium). Angie Fava. Olives. Fruits Canned fruit in a light or heavy syrup. Fried fruit. Fruit in cream or butter sauce. Meat and other protein foods Fatty cuts of meat. Ribs. Fried meat. Berniece Salines. Sausage. Bologna and other processed lunch meats. Salami. Fatback. Hotdogs. Bratwurst. Salted nuts and seeds. Canned beans with added salt. Canned or smoked fish. Whole eggs or egg yolks. Chicken or [place] with skin. Dairy Whole or 2% milk, cream, and half-and-half. Whole or full-fat cream cheese. Whole-fat or sweetened yogurt. Full-fat cheese. Nondairy creamers. Whipped  toppings. Processed cheese and cheese spreads. Fats and oils Butter. Stick margarine. Lard. Shortening. Ghee. Bacon fat. Tropical oils, such as coconut, palm kernel, or palm oil. Seasoning and other foods Salted popcorn and pretzels. Onion salt, garlic salt, seasoned salt, table salt, and sea salt. Worcestershire sauce. Tartar sauce. Barbecue sauce. Teriyaki sauce. Soy sauce, including reduced-sodium. Steak sauce. Canned and packaged gravies. Fish sauce. Oyster sauce. Cocktail sauce. Horseradish that you find on the shelf. Ketchup. Mustard. Meat flavorings and tenderizers. Bouillon cubes. Hot sauce and Tabasco sauce. Premade or packaged marinades. Premade or packaged taco seasonings. Relishes. Regular salad dressings. Where to find more information:  National Heart, Lung, and [place]: https://wilson-eaton.com/  American Heart Association: www.heart.org Summary  The DASH eating plan is a healthy eating plan that has been shown to reduce high blood pressure (hypertension). It may also reduce your risk for type 2 diabetes, heart disease, and stroke.  With the DASH eating plan, you should limit salt (sodium) intake to 2,300 mg a day. If you have hypertension, you may need to reduce your sodium intake to 1,500 mg a day.  When on the DASH eating plan, aim to eat more fresh fruits and vegetables, whole grains, lean proteins, low-fat dairy, and heart-healthy fats.  Work with your health care provider or diet and nutrition specialist (dietitian) to adjust your eating plan to  your individual calorie needs. This information is not intended to replace advice given to you by your health care provider. Make sure you discuss any questions you have with your health care provider. Document Released: 12/06/2011 Document Revised: 12/10/2016 Document Reviewed: 12/10/2016 Elsevier Interactive Patient Education  2019 [employer].

## 2019-05-29 NOTE — Progress Notes (Signed)
Subjective:  Patient ID: Megan Chapman, female    DOB: 1931/05/28  Age: 85 y.o. MRN: [id number]  CC: Medical Management of Chronic Issues   HPI Megan Chapman presents for patient presents for follow-up on  thyroid. The patient has a history of hypothyroidism for many years. It has been stable recently. Pt. denies any change in  voice, loss of hair, heat or cold intolerance. Energy level has been adequate to good. Patient denies constipation and diarrhea. No myxedema. Medication is as noted below. Verified that pt is taking it daily on an empty stomach. Well tolerated.  Concerned that her blood pressure has been elevated on several occasions and that Megan Chapman has been having palpitations.  They only last a few moments and is only been a few times.  Depression screen [facility] 2/9 05/29/2019 10/14/2018 04/14/2018  Decreased Interest 1 0 0  Down, Depressed, Hopeless 0 0 0  PHQ - 2 Score 1 0 0  Altered sleeping 0 - -  Tired, decreased energy 1 - -  Change in appetite 0 - -  Feeling bad or failure about yourself  0 - -  Trouble concentrating 0 - -  Moving slowly or fidgety/restless 0 - -  Suicidal thoughts - - -  PHQ-9 Score 2 - -  Difficult doing work/chores - - -    History Megan Chapman has a past medical history of Anxiety, Asthma, Basal cell carcinoma, Cataract, GERD (gastroesophageal reflux disease), Hyperlipidemia, Migraine, and Thyroid disease.   Megan Chapman has a past surgical history that includes Partial knee arthroplasty (Left); Abdominal hysterectomy; Meniscus repair (Right); Excision basal cell carcinoma (2017); Cataract extraction (Bilateral); Appendectomy; and Tonsillectomy.   Her family history includes COPD in her son; Cancer in her brother, father, and mother; Down syndrome in her daughter; Migraines in her brother and son; Stroke in her brother.Megan Chapman reports that Megan Chapman has never smoked. Megan Chapman has never used smokeless tobacco. Megan Chapman reports that Megan Chapman does not drink alcohol or use drugs.    ROS Review of  Systems  Constitutional: Negative.   HENT: Negative for congestion.   Eyes: Negative for visual disturbance.  Respiratory: Negative for shortness of breath.   Cardiovascular: Negative for chest pain.  Gastrointestinal: Negative for abdominal pain, constipation, diarrhea, nausea and vomiting.  Genitourinary: Negative for difficulty urinating.  Musculoskeletal: Negative for arthralgias and myalgias.  Neurological: Negative for headaches.  Psychiatric/Behavioral: Negative for sleep disturbance.    Objective:  BP (!) 153/69   Pulse 77   Temp (!) 97.4 F (36.3 C) (Oral)   Ht '5\' 2"'  (1.575 m)   Wt 149 lb (67.6 kg)   BMI 27.25 kg/m   BP Readings from Last 3 Encounters:  05/29/19 (!) 153/69  10/14/18 132/81  04/14/18 134/80    Wt Readings from Last 3 Encounters:  05/29/19 149 lb (67.6 kg)  10/14/18 150 lb 9.6 oz (68.3 kg)  04/14/18 147 lb 6 oz (66.8 kg)     Physical Exam Constitutional:      General: Megan Chapman is not in acute distress.    Appearance: Megan Chapman is well-developed.  HENT:     Head: Normocephalic and atraumatic.     Right Ear: External ear normal.     Left Ear: External ear normal.     Nose: Nose normal.  Eyes:     Conjunctiva/sclera: Conjunctivae normal.     Pupils: Pupils are equal, round, and reactive to light.  Neck:     Musculoskeletal: Normal range of motion and neck supple.  Thyroid: No thyromegaly.  Cardiovascular:     Rate and Rhythm: Normal rate and regular rhythm.     Heart sounds: Normal heart sounds. No murmur.  Pulmonary:     Effort: Pulmonary effort is normal. No respiratory distress.     Breath sounds: Normal breath sounds. No wheezing or rales.  Abdominal:     General: Bowel sounds are normal. There is no distension.     Palpations: Abdomen is soft.     Tenderness: There is no abdominal tenderness.  Lymphadenopathy:     Cervical: No cervical adenopathy.  Skin:    General: Skin is warm and dry.  Neurological:     Mental Status: Megan Chapman is alert  and oriented to person, place, and time.     Deep Tendon Reflexes: Reflexes are normal and symmetric.  Psychiatric:        Behavior: Behavior normal.        Thought Content: Thought content normal.        Judgment: Judgment normal.       Assessment & Plan:   Megan Chapman was seen today for medical management of chronic issues.  Diagnoses and all orders for this visit:  Acquired hypothyroidism -     TSH -     T4, Free  Palpitations -     Ambulatory referral to Cardiology -     CMP14+EGFR  Accelerated hypertension -     Ambulatory referral to Cardiology -     CBC with Differential/Platelet -     CMP14+EGFR -     Urinalysis  Mixed hyperlipidemia -     Lipid panel  Other orders -     atorvastatin (LIPITOR) 40 MG tablet; Take 1 tablet (40 mg total) by mouth daily. -     levothyroxine (SYNTHROID) 25 MCG tablet; TAKE (1) TABLET DAILY BE- FORE BREAKFAST. -     lisinopril (ZESTRIL) 5 MG tablet; Take 1 tablet (5 mg total) by mouth daily.       I am having Megan Chapman start on lisinopril. I am also having her maintain her aspirin, Vitamin D3, Fish Oil, ALPRAZolam, atorvastatin, and levothyroxine.  Allergies as of 05/29/2019      Reactions   Amoxicillin    Biaxin [clarithromycin]    Codeine    Nitrofurantoin Nausea And Vomiting, Nausea Only   Sulfa Antibiotics       Medication List       Accurate as of May 29, 2019  5:52 PM. If you have any questions, ask your nurse or doctor.        ALPRAZolam 0.5 MG tablet Commonly known as:  XANAX Take 1 tablet (0.5 mg total) by mouth at bedtime as needed. for sleep   aspirin 81 MG tablet Take 81 mg by mouth daily.   atorvastatin 40 MG tablet Commonly known as:  LIPITOR Take 1 tablet (40 mg total) by mouth daily.   Fish Oil 1200 MG Caps Take 2 capsules by mouth daily.   levothyroxine 25 MCG tablet Commonly known as:  SYNTHROID TAKE (1) TABLET DAILY BE- FORE BREAKFAST.   lisinopril 5 MG tablet Commonly known as:  ZESTRIL  Take 1 tablet (5 mg total) by mouth daily. Started by:  [name], MD   Vitamin D3 25 MCG (1000 UT) Caps Take 2 capsules (2,000 Units total) by mouth daily.        Follow-up: Return in about 6 months (around 11/29/2019).  [name], M.D.

## 2019-05-30 LAB — CBC WITH DIFFERENTIAL/PLATELET
Basophils Absolute: 0 10*3/uL (ref 0.0–0.2)
Basos: 0 %
EOS (ABSOLUTE): 0.1 10*3/uL (ref 0.0–0.4)
Eos: 3 %
Hematocrit: 36.6 % (ref 34.0–46.6)
Hemoglobin: 12.4 g/dL (ref 11.1–15.9)
Immature Grans (Abs): 0 10*3/uL (ref 0.0–0.1)
Immature Granulocytes: 1 %
Lymphocytes Absolute: 1.1 10*3/uL (ref 0.7–3.1)
Lymphs: 37 %
MCH: 29.5 pg (ref 26.6–33.0)
MCHC: 33.9 g/dL (ref 31.5–35.7)
MCV: 87 fL (ref 79–97)
Monocytes Absolute: 0.4 10*3/uL (ref 0.1–0.9)
Monocytes: 14 %
Neutrophils Absolute: 1.3 10*3/uL — ABNORMAL LOW (ref 1.4–7.0)
Neutrophils: 45 %
Platelets: 232 10*3/uL (ref 150–450)
RBC: 4.21 x10E6/uL (ref 3.77–5.28)
RDW: 13.1 % (ref 11.7–15.4)
WBC: 2.8 10*3/uL — ABNORMAL LOW (ref 3.4–10.8)

## 2019-05-30 LAB — CMP14+EGFR
ALT: 14 IU/L (ref 0–32)
AST: 16 IU/L (ref 0–40)
Albumin/Globulin Ratio: 2 (ref 1.2–2.2)
Albumin: 4.5 g/dL (ref 3.6–4.6)
Alkaline Phosphatase: 61 IU/L (ref 39–117)
BUN/Creatinine Ratio: 16 (ref 12–28)
BUN: 14 mg/dL (ref 8–27)
Bilirubin Total: 0.5 mg/dL (ref 0.0–1.2)
CO2: 22 mmol/L (ref 20–29)
Calcium: 9.7 mg/dL (ref 8.7–10.3)
Chloride: 104 mmol/L (ref 96–106)
Creatinine, Ser: 0.88 mg/dL (ref 0.57–1.00)
GFR calc Af Amer: 69 mL/min/{1.73_m2} (ref >59)
GFR calc non Af Amer: 60 mL/min/{1.73_m2} (ref >59)
Globulin, Total: 2.3 g/dL (ref 1.5–4.5)
Glucose: 92 mg/dL (ref 65–99)
Potassium: 4.5 mmol/L (ref 3.5–5.2)
Sodium: 141 mmol/L (ref 134–144)
Total Protein: 6.8 g/dL (ref 6.0–8.5)

## 2019-05-30 LAB — LIPID PANEL
Chol/HDL Ratio: 3 ratio (ref 0.0–4.4)
Cholesterol, Total: 176 mg/dL (ref 100–199)
HDL: 59 mg/dL (ref >39)
LDL Calculated: 93 mg/dL (ref 0–99)
Triglycerides: 122 mg/dL (ref 0–149)
VLDL Cholesterol Cal: 24 mg/dL (ref 5–40)

## 2019-05-30 LAB — T4, FREE: Free T4: 1.21 ng/dL (ref 0.82–1.77)

## 2019-05-30 LAB — TSH: TSH: 3.94 u[IU]/mL (ref 0.450–4.500)

## 2019-06-04 ENCOUNTER — Ambulatory Visit (INDEPENDENT_AMBULATORY_CARE_PROVIDER_SITE_OTHER): Payer: Medicare Other | Admitting: *Deleted

## 2019-06-04 ENCOUNTER — Other Ambulatory Visit: Payer: Self-pay

## 2019-06-04 DIAGNOSIS — Z0000 Encounter for general adult medical examination without abnormal findings: Secondary | ICD-10-CM

## 2019-06-04 NOTE — Patient Instructions (Signed)
Preventive Care 83 Years and Older, Female Preventive care refers to lifestyle choices and visits with your health care provider that can promote health and wellness. What does preventive care include?  A yearly physical exam. This is also called an annual well check.  Dental exams once or twice a year.  Routine eye exams. Ask your health care provider how often you should have your eyes checked.  Personal lifestyle choices, including: ? Daily care of your teeth and gums. ? Regular physical activity. ? Eating a healthy diet. ? Avoiding tobacco and drug use. ? Limiting alcohol use. ? Practicing safe sex. ? Taking low-dose aspirin every day. ? Taking vitamin and mineral supplements as recommended by your health care provider. What happens during an annual well check? The services and screenings done by your health care provider during your annual well check will depend on your age, overall health, lifestyle risk factors, and family history of disease. Counseling Your health care provider may ask you questions about your:  Alcohol use.  Tobacco use.  Drug use.  Emotional well-being.  Home and relationship well-being.  Sexual activity.  Eating habits.  History of falls.  Memory and ability to understand (cognition).  Work and work Statistician.  Reproductive health.  Screening You may have the following tests or measurements:  Height, weight, and BMI.  Blood pressure.  Lipid and cholesterol levels. These may be checked every 5 years, or more frequently if you are over 30 years old.  Skin check.  Lung cancer screening. You may have this screening every year starting at age 27 if you have a 30-pack-year history of smoking and currently smoke or have quit within the past 15 years.  Colorectal cancer screening. All adults should have this screening starting at age 33 and continuing until age 46. You will have tests every 1-10 years, depending on your results and the  type of screening test. People at increased risk should start screening at an earlier age. Screening tests may include: ? Guaiac-based fecal occult blood testing. ? Fecal immunochemical test (FIT). ? Stool DNA test. ? Virtual colonoscopy. ? Sigmoidoscopy. During this test, a flexible tube with a tiny camera (sigmoidoscope) is used to examine your rectum and lower colon. The sigmoidoscope is inserted through your anus into your rectum and lower colon. ? Colonoscopy. During this test, a long, thin, flexible tube with a tiny camera (colonoscope) is used to examine your entire colon and rectum.  Hepatitis C blood test.  Hepatitis B blood test.  Sexually transmitted disease (STD) testing.  Diabetes screening. This is done by checking your blood sugar (glucose) after you have not eaten for a while (fasting). You may have this done every 1-3 years.  Bone density scan. This is done to screen for osteoporosis. You may have this done starting at age 37.  Mammogram. This may be done every 1-2 years. Talk to your health care provider about how often you should have regular mammograms. Talk with your health care provider about your test results, treatment options, and if necessary, the need for more tests. Vaccines Your health care provider may recommend certain vaccines, such as:  Influenza vaccine. This is recommended every year.  Tetanus, diphtheria, and acellular pertussis (Tdap, Td) vaccine. You may need a Td booster every 10 years.  Varicella vaccine. You may need this if you have not been vaccinated.  Zoster vaccine. You may need this after age 38.  Measles, mumps, and rubella (MMR) vaccine. You may need at least  one dose of MMR if you were born in 1957 or later. You may also need a second dose.  Pneumococcal 13-valent conjugate (PCV13) vaccine. One dose is recommended after age 24.  Pneumococcal polysaccharide (PPSV23) vaccine. One dose is recommended after age 24.  Meningococcal  vaccine. You may need this if you have certain conditions.  Hepatitis A vaccine. You may need this if you have certain conditions or if you travel or work in places where you may be exposed to hepatitis A.  Hepatitis B vaccine. You may need this if you have certain conditions or if you travel or work in places where you may be exposed to hepatitis B.  Haemophilus influenzae type b (Hib) vaccine. You may need this if you have certain conditions. Talk to your health care provider about which screenings and vaccines you need and how often you need them. This information is not intended to replace advice given to you by your health care provider. Make sure you discuss any questions you have with your health care provider. Document Released: 01/13/2016 Document Revised: 02/06/2018 Document Reviewed: 10/18/2015 Elsevier Interactive Patient Education  2019 [employer].

## 2019-06-04 NOTE — Progress Notes (Signed)
MEDICARE ANNUAL WELLNESS VISIT  06/04/2019  Telephone Visit Disclaimer This Medicare AWV was conducted by telephone due to national recommendations for restrictions regarding the COVID-19 Pandemic (e.g. social distancing).  I verified, using two identifiers, that I am speaking with Megan Chapman or their authorized healthcare agent. I discussed the limitations, risks, security, and privacy concerns of performing an evaluation and management service by telephone and the potential availability of an in-person appointment in the future. The patient expressed understanding and agreed to proceed.   Subjective:  Megan Chapman is a 85 y.o. female patient of Stacks, Cletus Gash, MD who had a Medicare Annual Wellness Visit today via telephone. Megan Chapman is Retired and lives alone. she has 4 children. she reports that she is socially active and does interact with friends/family regularly. she is minimally physically active and enjoys cooking.  Patient Care Team: [name], MD as PCP - General (Family Medicine) Levy Sjogren, MD as Referring Physician (Dermatology) Henreitta Leber, MD as Referring Physician (Orthopedic Surgery)  Advanced Directives 06/04/2019 10/22/2017 08/22/2016 08/22/2016 04/07/2015  Does Patient Have a Medical Advance Directive? _0   Type of Paramedic of [place];Living will - [place];Living will [place];Living will Living will;Healthcare Power of Attorney  Does patient want to make changes to medical advance directive? No - Patient declined - No - Patient declined - No - Patient declined  Copy of [place] in Chart? No - copy requested - No - copy requested No - copy requested No - copy requested    Hospital Utilization Over the Past 12 Months: # of hospitalizations or ER visits: 0 # of surgeries: 1-basal cell carcinoma on her nose  Review of Systems    Patient reports that  her overall health is unchanged compared to last year.  Patient Reported Readings (BP, Pulse, CBG, Weight, etc) none  Review of Systems: No complaints  All other systems negative.  Pain Assessment Pain : No/denies pain     Current Medications & Allergies (verified) Allergies as of 06/04/2019      Reactions   Amoxicillin    Biaxin [clarithromycin]    Codeine    Nitrofurantoin Nausea And Vomiting, Nausea Only   Sulfa Antibiotics       Medication List       Accurate as of June 04, 2019 10:08 AM. If you have any questions, ask your nurse or doctor.        ALPRAZolam 0.5 MG tablet Commonly known as:  XANAX Take 1 tablet (0.5 mg total) by mouth at bedtime as needed. for sleep   aspirin 81 MG tablet Take 81 mg by mouth daily.   atorvastatin 40 MG tablet Commonly known as:  LIPITOR Take 1 tablet (40 mg total) by mouth daily.   Fish Oil 1200 MG Caps Take 2 capsules by mouth daily.   levothyroxine 25 MCG tablet Commonly known as:  SYNTHROID TAKE (1) TABLET DAILY BE- FORE BREAKFAST.   lisinopril 5 MG tablet Commonly known as:  ZESTRIL Take 1 tablet (5 mg total) by mouth daily.   Vitamin D3 25 MCG (1000 UT) Caps Take 2 capsules (2,000 Units total) by mouth daily.       History (reviewed): Past Medical History:  Diagnosis Date  . Anxiety   . Asthma    not treated  . Basal cell carcinoma   . Cataract   . GERD (gastroesophageal reflux disease)    Not treated  .  Hyperlipidemia   . Migraine   . Palpitations   . Thyroid disease    hypothyroid   Past Surgical History:  Procedure Laterality Date  . ABDOMINAL HYSTERECTOMY     partial  . APPENDECTOMY    . BASAL CELL CARCINOMA EXCISION  2017   nose  . CATARACT EXTRACTION Bilateral   . MENISCUS REPAIR Right    03/11/15  . PARTIAL KNEE ARTHROPLASTY Left   . TONSILLECTOMY     Family History  Problem Relation Age of Onset  . Cancer Mother        colon met to lung  . Cancer Father        prostate - met to  bone  . Cancer Brother   . Stroke Brother   . Migraines Brother   . COPD Son   . Migraines Son   . Down syndrome Daughter    Social History   Socioeconomic History  . Marital status: Widowed    Spouse name: Not on file  . Number of children: 4  . Years of education: 10  . Highest education level: GED or equivalent  Occupational History  . Occupation: retired  Scientific laboratory technician  . Financial resource strain: Not hard at all  . Food insecurity:    Worry: Never true    Inability: Never true  . Transportation needs:    Medical: No    Non-medical: No  Tobacco Use  . Smoking status: Never Smoker  . Smokeless tobacco: Never Used  Substance and Sexual Activity  . Alcohol use: No  . Drug use: No  . Sexual activity: Never    Birth control/protection: Surgical  Lifestyle  . Physical activity:    Days per week: 0 days    Minutes per session: 0 min  . Stress: Only a little  Relationships  . Social connections:    Talks on phone: More than three times a week    Gets together: More than three times a week    Attends religious service: More than 4 times per year    Active member of club or organization: Yes    Attends meetings of clubs or organizations: More than 4 times per year    Relationship status: Widowed  Other Topics Concern  . Not on file  Social History Narrative  . Not on file    Activities of Daily Living In your present state of health, do you have any difficulty performing the following activities: 06/04/2019  Hearing? N  Vision? N  Difficulty concentrating or making decisions? N  Walking or climbing stairs? N  Dressing or bathing? N  Doing errands, shopping? N  Preparing Food and eating ? N  Using the Toilet? N  In the past six months, have you accidently leaked urine? N  Do you have problems with loss of bowel control? N  Managing your Medications? N  Managing your Finances? N  Housekeeping or managing your Housekeeping? N  Some recent data might be hidden     Patient Literacy How often do you need to have someone help you when you read instructions, pamphlets, or other written materials from your doctor or pharmacy?: 1 - Never What is the last grade level you completed in school?: 9th grade-then got GED at [facility]  Exercise Current Exercise Habits: The patient does not participate in regular exercise at present, Exercise limited by: orthopedic condition(s)  Diet Patient reports consuming 3 meals a day and 0 snack(s) a day Patient reports that her primary  diet is: Regular Patient reports that she does have regular access to food.   Depression Screen PHQ 2/9 Scores 06/04/2019 05/29/2019 10/14/2018 04/14/2018 10/14/2017 04/17/2017 10/12/2016  PHQ - 2 Score 0 1 0 0 0 0 1  PHQ- 9 Score - 2 - - - - -     Fall Risk Fall Risk  06/04/2019 05/29/2019 10/14/2018 04/14/2018 10/14/2017  Falls in the past year? 0 0 No No No  Number falls in past yr: - - - - -  Injury with Fall? - - - - -  Risk for fall due to : - - - - -     Objective:  Trystin Trafton seemed alert and oriented and she participated appropriately during our telephone visit.  Blood Pressure Weight BMI  BP Readings from Last 3 Encounters:  05/29/19 (!) 153/69  10/14/18 132/81  04/14/18 134/80   Wt Readings from Last 3 Encounters:  05/29/19 149 lb (67.6 kg)  10/14/18 150 lb 9.6 oz (68.3 kg)  04/14/18 147 lb 6 oz (66.8 kg)   BMI Readings from Last 1 Encounters:  05/29/19 27.25 kg/m    *Unable to obtain current vital signs, weight, and BMI due to telephone visit type  Hearing/Vision  . Catelyn did not seem to have difficulty with hearing/understanding during the telephone conversation . Reports that she has not had a formal eye exam by an eye care professional within the past year . Reports that she has not had a formal hearing evaluation within the past year *Unable to fully assess hearing and vision during telephone visit type  Cognitive Function: 6CIT Screen 06/04/2019  What Year? 0  points  What month? 0 points  What time? 0 points  Count back from 20 0 points  Months in reverse 0 points  Repeat phrase 2 points  Total Score 2    Normal Cognitive Function Screening: Yes (Normal:0-7, Significant for Dysfunction: >8)  Immunization & Health Maintenance Record Immunization History  Administered Date(s) Administered  . Influenza, High Dose Seasonal PF 10/14/2017, 10/14/2018  . Influenza,inj,Quad PF,6+ Mos 11/15/2014, 10/12/2016  . Pneumococcal Conjugate-13 04/14/2018  . Pneumococcal Polysaccharide-23 11/30/2002  . Td 05/31/2005    Health Maintenance  Topic Date Due  . MAMMOGRAM  10/15/2019 (Originally 08/22/2017)  . TETANUS/TDAP  10/15/2019 (Originally 06/01/2015)  . INFLUENZA VACCINE  08/01/2019  . DEXA SCAN  04/04/2020  . PNA vac Low Risk Adult  Completed       Assessment  This is a routine wellness examination for [employer].  Health Maintenance: Due or Overdue There are no preventive care reminders to display for this patient.  Aerilynn Nightengale does not need a referral for Community Assistance: Care Management:   no Social Work:    no Prescription Assistance:  no Nutrition/Diabetes Education:  no   Plan:  Personalized Goals Goals Addressed            This Visit's Progress   . DIET - REDUCE SODIUM INTAKE        Personalized Health Maintenance & Screening Recommendations  Td vaccine Shingles vaccine  Lung Cancer Screening Recommended: no (Low Dose CT Chest recommended if Age 76-80 years, 30 pack-year currently smoking OR have quit w/in past 15 years) Hepatitis C Screening recommended: no HIV Screening recommended: no  Advanced Directives: Written information was not prepared per patient's request.  Referrals & Orders No orders of the defined types were placed in this encounter.   Follow-up Plan . Follow-up with [name], MD as planned .  Consider Shingles and TDAP vaccines at next visit with your PCP    I have personally  reviewed and noted the following in the patient's chart:   . Medical and social history . Use of alcohol, tobacco or illicit drugs  . Current medications and supplements . Functional ability and status . Nutritional status . Physical activity . Advanced directives . List of other physicians . Hospitalizations, surgeries, and ER visits in previous 12 months . Vitals . Screenings to include cognitive, depression, and falls . Referrals and appointments  In addition, I have reviewed and discussed with Khristina Seiple certain preventive protocols, quality metrics, and best practice recommendations. A written personalized care plan for preventive services as well as general preventive health recommendations is available and can be mailed to the patient at her request.      Marylin Crosby  06/04/2019

## 2019-06-11 DIAGNOSIS — H02839 Dermatochalasis of unspecified eye, unspecified eyelid: Secondary | ICD-10-CM | POA: Diagnosis not present

## 2019-06-11 DIAGNOSIS — H527 Unspecified disorder of refraction: Secondary | ICD-10-CM | POA: Diagnosis not present

## 2019-06-11 DIAGNOSIS — H353121 Nonexudative age-related macular degeneration, left eye, early dry stage: Secondary | ICD-10-CM | POA: Diagnosis not present

## 2019-07-13 ENCOUNTER — Telehealth: Payer: Self-pay | Admitting: *Deleted

## 2019-07-13 NOTE — Telephone Encounter (Signed)
    COVID-19 Pre-Screening Questions:  . In the past 7 to 10 days have you had a cough,  shortness of breath, headache, congestion, fever (100 or greater) body aches, chills, sore throat, or sudden loss of taste or sense of smell? . Have you been around anyone with known Covid 19. . Have you been around anyone who is awaiting Covid 19 test results in the past 7 to 10 days? . Have you been around anyone who has been exposed to Covid 19, or has mentioned symptoms of Covid 19 within the past 7 to 10 days?  If you have any concerns/questions about symptoms patients report during screening (either on the phone or at threshold). Contact the provider seeing the patient or DOD for further guidance.  If neither are available contact a member of the leadership team.        Patient is always sob, no to the other questions. She will wear a mask, come only 15 min early, alone and call us if her health changes. CP/cma

## 2019-07-13 NOTE — Progress Notes (Signed)
Cardiology Office Note   Date:  07/15/2019   ID:  Megan Chapman, DOB 1931-10-24, MRN [id number]  PCP:  [name], MD  Cardiologist:   No primary care provider on file. Referring:  [name], MD  No chief complaint on file.     History of Present Illness: Megan Chapman is a 85 y.o. female who is referred by [name], MD for evaluation of palpitations.  She says that she feels me sometimes when she is sitting watching TV.  She will feel her heart beating hard.  Is not racing or skipping chest.  He does not have any presyncope or syncope.  She does not bring in a log with activity.  She does her household chores living alone.  With this she denies any chest pressure, neck or arm discomfort.  She does not have any shortness of breath, PND or orthopnea.  She has slowed down over the years.  She is mostly on lisinopril for her blood pressure which he has her blood pressure is okay.  However, she admits not checking it routinely.  Past Medical History:  Diagnosis Date  . Anxiety   . Asthma    not treated  . Basal cell carcinoma   . Cataract   . GERD (gastroesophageal reflux disease)    Not treated  . Hyperlipidemia   . Migraine   . Palpitations   . Thyroid disease    hypothyroid    Past Surgical History:  Procedure Laterality Date  . ABDOMINAL HYSTERECTOMY     partial  . APPENDECTOMY    . BASAL CELL CARCINOMA EXCISION  2017   nose  . CATARACT EXTRACTION Bilateral   . MENISCUS REPAIR Right    03/11/15  . PARTIAL KNEE ARTHROPLASTY Left   . TONSILLECTOMY       Current Outpatient Medications  Medication Sig Dispense Refill  . ALPRAZolam (XANAX) 0.5 MG tablet Take 1 tablet (0.5 mg total) by mouth at bedtime as needed. for sleep 30 tablet 0  . aspirin 81 MG tablet Take 81 mg by mouth daily.    [name] atorvastatin (LIPITOR) 40 MG tablet Take 1 tablet (40 mg total) by mouth daily. 90 tablet 1  . Cholecalciferol (VITAMIN D3) 1000 UNITS CAPS Take 2 capsules (2,000 Units  total) by mouth daily. 30 capsule 11  . levothyroxine (SYNTHROID) 25 MCG tablet TAKE (1) TABLET DAILY BE- FORE BREAKFAST. 90 tablet 1  . Omega-3 Fatty Acids (FISH OIL) 1200 MG CAPS Take 2 capsules by mouth daily.    [name] lisinopril (ZESTRIL) 5 MG tablet Take 1 tablet (5 mg total) by mouth daily. (Patient not taking: Reported on 06/04/2019) 90 tablet 1   No current facility-administered medications for this visit.     Allergies:   Amoxicillin, Biaxin [clarithromycin], Codeine, Nitrofurantoin, and Sulfa antibiotics    Social History:  The patient  reports that she has never smoked. She has never used smokeless tobacco. She reports that she does not drink alcohol or use drugs.   Family History:  The patient's family history includes COPD in her son; Cancer in her brother, father, and mother; Down syndrome in her daughter; Migraines in her brother and son; Stroke in her brother.    ROS:  Please see the history of present illness.   Otherwise, review of systems are positive for none.   All other systems are reviewed and negative.    PHYSICAL EXAM: VS:  BP (!) 162/80   Pulse 72   Ht 5'  2" (1.575 m)   Wt 151 lb (68.5 kg)   BMI 27.62 kg/m  , BMI Body mass index is 27.62 kg/m. GENERAL:  Well appearing HEENT:  Pupils equal round and reactive, fundi not visualized.   NECK:  No jugular venous distention, waveform within normal limits, carotid upstroke brisk and symmetric, no bruits, no thyromegaly LYMPHATICS:  No cervical, inguinal adenopathy LUNGS:  Clear to auscultation bilaterally BACK:  No CVA tenderness CHEST:  Unremarkable HEART:  PMI not displaced or sustained,S1 and S2 within normal limits, no S3, no S4, no clicks, no rubs, no murmurs ABD:  Flat, positive bowel sounds normal in frequency in pitch, no bruits, no rebound, no guarding, no midline pulsatile mass, no hepatomegaly, no splenomegaly EXT:  2 plus pulses throughout, no edema, no cyanosis no clubbing SKIN:  No rashes no nodules  NEURO:  Cranial nerves II through XII grossly intact, motor grossly intact throughout PSYCH:  Cognitively intact, oriented to person place and time    EKG:  EKG is ordered today. The ekg ordered today demonstrates sinus rhythm, rate 72, axis normal limits, clear anterior laterally progression, no acute ST-T wave changes.   Recent Labs: 05/29/2019: ALT 14; BUN 14; Creatinine, Ser 0.88; Hemoglobin 12.4; Platelets 232; Potassium 4.5; Sodium 141; TSH 3.940    Lipid Panel    Component Value Date/Time   CHOL 176 05/29/2019 0855   CHOL 196 06/08/2013 1332   TRIG 122 05/29/2019 0855   TRIG 171 (H) 10/12/2016 1009   TRIG 207 (H) 06/08/2013 1332   HDL 59 05/29/2019 0855   HDL 54 10/12/2016 1009   HDL 52 06/08/2013 1332   CHOLHDL 3.0 05/29/2019 0855   LDLCALC 93 05/29/2019 0855   LDLCALC 80 09/08/2013 0958   LDLCALC 103 (H) 06/08/2013 1332      Wt Readings from Last 3 Encounters:  07/15/19 151 lb (68.5 kg)  05/29/19 149 lb (67.6 kg)  10/14/18 150 lb 9.6 oz (68.3 kg)      Other studies Reviewed: Additional studies/ records that were reviewed today include: None. Review of the above records demonstrates:  Please see elsewhere in the note.     ASSESSMENT AND PLAN:  PALPITATIONS:   The patient's palpitations are very infrequent.  She has not had any in about 3 weeks.  Given this and I think a monitor would be helpful.  She is not describing sustained tacky arrhythmia irregular arrhythmias.  At this point I would manage this conservatively but she will call me back if she has increasing symptoms I would plan.  HTN: Her blood pressure is elevated.  She wants to hold off on taking medications but does agree to a blood pressure diary and we will enforce this with her family get the results.  I think she is going to need to take her lisinopril.  DYSLIPIDEMIA: Her lipids are well controlled with a good HDL though her LDL is slightly elevated.  No change in therapy.   Current medicines  are reviewed at length with the patient today.  The patient does not have concerns regarding medicines.  The following changes have been made:  no change  Labs/ tests ordered today include: none No orders of the defined types were placed in this encounter.    Disposition:   FU with if BP is high on BP diary.     Signed, Minus Breeding, MD  07/15/2019 10:19 AM    [facility]

## 2019-07-15 ENCOUNTER — Other Ambulatory Visit: Payer: Self-pay

## 2019-07-15 ENCOUNTER — Ambulatory Visit (INDEPENDENT_AMBULATORY_CARE_PROVIDER_SITE_OTHER): Payer: Medicare Other | Admitting: Cardiology

## 2019-07-15 ENCOUNTER — Encounter: Payer: Self-pay | Admitting: Cardiology

## 2019-07-15 VITALS — BP 162/80 | HR 72 | Ht 62.0 in | Wt 151.0 lb

## 2019-07-15 DIAGNOSIS — R002 Palpitations: Secondary | ICD-10-CM | POA: Diagnosis not present

## 2019-07-15 DIAGNOSIS — I10 Essential (primary) hypertension: Secondary | ICD-10-CM | POA: Diagnosis not present

## 2019-07-15 DIAGNOSIS — E782 Mixed hyperlipidemia: Secondary | ICD-10-CM | POA: Diagnosis not present

## 2019-07-15 NOTE — Patient Instructions (Signed)
Medication Instructions:  The current medical regimen is effective;  continue present plan and medications.  If you need a refill on your cardiac medications before your next appointment, please call your pharmacy.   Please keep a blood pressure diary and notice the office if it is consistently elevated.  Follow-Up: Follow up as needed with Dr Percival Spanish.  Thank you for choosing [facility]!!

## 2019-09-28 ENCOUNTER — Other Ambulatory Visit: Payer: Self-pay

## 2019-09-29 ENCOUNTER — Ambulatory Visit (INDEPENDENT_AMBULATORY_CARE_PROVIDER_SITE_OTHER): Payer: Medicare Other

## 2019-09-29 DIAGNOSIS — Z23 Encounter for immunization: Secondary | ICD-10-CM | POA: Diagnosis not present

## 2019-11-30 ENCOUNTER — Ambulatory Visit (INDEPENDENT_AMBULATORY_CARE_PROVIDER_SITE_OTHER): Payer: Medicare Other | Admitting: Family Medicine

## 2019-11-30 ENCOUNTER — Other Ambulatory Visit: Payer: Self-pay

## 2019-11-30 ENCOUNTER — Encounter: Payer: Self-pay | Admitting: Family Medicine

## 2019-11-30 VITALS — BP 142/77 | HR 74 | Temp 96.9°F | Ht 62.0 in | Wt 150.6 lb

## 2019-11-30 DIAGNOSIS — I10 Essential (primary) hypertension: Secondary | ICD-10-CM | POA: Diagnosis not present

## 2019-11-30 DIAGNOSIS — E038 Other specified hypothyroidism: Secondary | ICD-10-CM | POA: Diagnosis not present

## 2019-11-30 DIAGNOSIS — H9113 Presbycusis, bilateral: Secondary | ICD-10-CM | POA: Diagnosis not present

## 2019-11-30 DIAGNOSIS — E782 Mixed hyperlipidemia: Secondary | ICD-10-CM

## 2019-11-30 DIAGNOSIS — R1310 Dysphagia, unspecified: Secondary | ICD-10-CM

## 2019-11-30 DIAGNOSIS — R1319 Other dysphagia: Secondary | ICD-10-CM

## 2019-11-30 MED ORDER — ALPRAZOLAM 0.5 MG PO TABS: 0.5000 mg | ORAL_TABLET | Freq: Every evening | ORAL | 0 refills | Status: DC | PRN

## 2019-11-30 MED ORDER — ATORVASTATIN CALCIUM 40 MG PO TABS: 40.0000 mg | ORAL_TABLET | Freq: Every day | ORAL | 1 refills | Status: DC

## 2019-11-30 MED ORDER — LEVOTHYROXINE SODIUM 25 MCG PO TABS: ORAL_TABLET | ORAL | 1 refills | Status: DC

## 2019-11-30 NOTE — Progress Notes (Signed)
Subjective:  Patient ID: Megan Chapman, female    DOB: [birth year]  Age: 85 y.o. MRN: [id number]  CC: Medical Management of Chronic Issues (Patient is having to take a lot of tums for about a year. ) and Hypothyroidism   HPI Megan Chapman presents for  follow-up of hypertension. Patient has no history of headache chest pain or shortness of breath or recent cough. Patient also denies symptoms of TIA such as focal numbness or weakness.Pt. declined to take lisinopril previously prescribed because it didn't work for her mother. She reports SBP at home runs 139-144. Admits eating a lot of salty foods.   Pt. Reports belching and heartburn when she eats in the afternoon and evening. No problem in the mornin. Food seems to catch at the upper substernal region. She has to swallow hard several times or take several drinks of fluid to make it go down. Averages taking one or two tums a day.    follow-up on  thyroid. The patient has a history of hypothyroidism for many years. It has been stable recently. Pt. denies any change in  voice, loss of hair, heat or cold intolerance. Energy level has been adequate to good. Patient denies constipation and diarrhea. No myxedema. Medication is as noted below. Verified that pt is taking it daily on an empty stomach. Well tolerated.   in for follow-up of elevated cholesterol. Doing well without complaints on current medication. Denies side effects of statin including myalgia and arthralgia and nausea. Currently no chest pain, shortness of breath or other cardiovascular related symptoms noted.   History Megan Chapman has a past medical history of Anxiety, Asthma, Basal cell carcinoma, Cataract, GERD (gastroesophageal reflux disease), HTN (hypertension), Hyperlipidemia, Migraine, Palpitations, and Thyroid disease.   She has a past surgical history that includes Partial knee arthroplasty (Left); Abdominal hysterectomy; Meniscus repair (Right); Excision basal cell carcinoma (2017);  Cataract extraction (Bilateral); Appendectomy; and Tonsillectomy.   Her family history includes COPD in her son; Cancer in her brother, father, and mother; Down syndrome in her daughter; Migraines in her brother and son; Stroke in her brother.She reports that she has never smoked. She has never used smokeless tobacco. She reports that she does not drink alcohol or use drugs.  Current Outpatient Medications on File Prior to Visit  Medication Sig Dispense Refill  . aspirin 81 MG tablet Take 81 mg by mouth daily.    . Cholecalciferol (VITAMIN D3) 1000 UNITS CAPS Take 2 capsules (2,000 Units total) by mouth daily. 30 capsule 11  . Omega-3 Fatty Acids (FISH OIL) 1200 MG CAPS Take 2 capsules by mouth daily.     No current facility-administered medications on file prior to visit.     ROS Review of Systems  Constitutional: Negative.   HENT: Positive for hearing loss.   Eyes: Negative for visual disturbance.  Respiratory: Negative for shortness of breath.   Cardiovascular: Positive for chest pain (substernal - food gets caught).  Gastrointestinal: Positive for abdominal pain (heartburn).  Musculoskeletal: Negative for arthralgias.    Objective:  BP (!) 142/77   Pulse 74   Temp (!) 96.9 F (36.1 C) (Temporal)   Ht 5\' 2"  (1.575 m)   Wt 150 lb 9.6 oz (68.3 kg)   SpO2 99%   BMI 27.55 kg/m   BP Readings from Last 3 Encounters:  11/30/19 (!) 142/77  07/15/19 (!) 162/80  05/29/19 (!) 153/69    Wt Readings from Last 3 Encounters:  11/30/19 150 lb 9.6 oz (68.3 kg)  07/15/19 151 lb (68.5 kg)  05/29/19 149 lb (67.6 kg)     Physical Exam Constitutional:      General: She is not in acute distress.    Appearance: She is well-developed.  HENT:     Head: Normocephalic and atraumatic.     Right Ear: External ear normal.     Left Ear: External ear normal.     Nose: Nose normal.  Eyes:     Conjunctiva/sclera: Conjunctivae normal.     Pupils: Pupils are equal, round, and reactive to  light.  Neck:     Musculoskeletal: Normal range of motion and neck supple.     Thyroid: No thyromegaly.  Cardiovascular:     Rate and Rhythm: Normal rate and regular rhythm.     Heart sounds: Normal heart sounds. No murmur.  Pulmonary:     Effort: Pulmonary effort is normal. No respiratory distress.     Breath sounds: Normal breath sounds. No wheezing or rales.  Abdominal:     General: Bowel sounds are normal. There is no distension.     Palpations: Abdomen is soft.     Tenderness: There is no abdominal tenderness.  Lymphadenopathy:     Cervical: No cervical adenopathy.  Skin:    General: Skin is warm and dry.  Neurological:     Mental Status: She is alert and oriented to person, place, and time.     Deep Tendon Reflexes: Reflexes are normal and symmetric.  Psychiatric:        Behavior: Behavior normal.        Thought Content: Thought content normal.        Judgment: Judgment normal.       Assessment & Plan:   Megan Chapman was seen today for medical management of chronic issues and hypothyroidism.  Diagnoses and all orders for this visit:  Other specified hypothyroidism -     CBC -     CMP -     TSH + free T4 -     Lipid  Essential hypertension -     CBC -     CMP -     TSH + free T4 -     Lipid  Mixed hyperlipidemia -     CBC -     CMP -     TSH + free T4 -     Lipid  Presbycusis of both ears -     ENT  Esophageal dysphagia -     Gastroenterology  Other orders -     levothyroxine (SYNTHROID) 25 MCG tablet; TAKE (1) TABLET DAILY BE- FORE BREAKFAST. -     atorvastatin (LIPITOR) 40 MG tablet; Take 1 tablet (40 mg total) by mouth daily. -     ALPRAZolam (XANAX) 0.5 MG tablet; Take 1 tablet (0.5 mg total) by mouth at bedtime as needed. for sleep   Allergies as of 11/30/2019      Reactions   Amoxicillin    Biaxin [clarithromycin]    Codeine    Nitrofurantoin Nausea And Vomiting, Nausea Only   Sulfa Antibiotics       Medication List       Accurate as  of November 30, 2019  9:02 AM. If you have any questions, ask your nurse or doctor.        STOP taking these medications   lisinopril 5 MG tablet Commonly known as: ZESTRIL Stopped by: [name], MD     TAKE these medications   ALPRAZolam 0.5 MG  tablet Commonly known as: XANAX Take 1 tablet (0.5 mg total) by mouth at bedtime as needed. for sleep   aspirin 81 MG tablet Take 81 mg by mouth daily.   atorvastatin 40 MG tablet Commonly known as: LIPITOR Take 1 tablet (40 mg total) by mouth daily.   Fish Oil 1200 MG Caps Take 2 capsules by mouth daily.   levothyroxine 25 MCG tablet Commonly known as: SYNTHROID TAKE (1) TABLET DAILY BE- FORE BREAKFAST.   Vitamin D3 25 MCG (1000 UT) Caps Take 2 capsules (2,000 Units total) by mouth daily.       Meds ordered this encounter  Medications  . levothyroxine (SYNTHROID) 25 MCG tablet    Sig: TAKE (1) TABLET DAILY BE- FORE BREAKFAST.    Dispense:  90 tablet    Refill:  1  . atorvastatin (LIPITOR) 40 MG tablet    Sig: Take 1 tablet (40 mg total) by mouth daily.    Dispense:  90 tablet    Refill:  1    $  . ALPRAZolam (XANAX) 0.5 MG tablet    Sig: Take 1 tablet (0.5 mg total) by mouth at bedtime as needed. for sleep    Dispense:  30 tablet    Refill:  0    DASH program recommended. Printout given with AVS. OTC omperazole on empty stomach daily recommended.  Follow-up: Return in about 6 months (around 05/29/2020).  [name], M.D.

## 2019-11-30 NOTE — Patient Instructions (Signed)

## 2019-12-01 ENCOUNTER — Encounter: Payer: Self-pay | Admitting: Physician Assistant

## 2019-12-01 LAB — CBC WITH DIFFERENTIAL/PLATELET
Basophils Absolute: 0 10*3/uL (ref 0.0–0.2)
Basos: 0 %
EOS (ABSOLUTE): 0.1 10*3/uL (ref 0.0–0.4)
Eos: 2 %
Hematocrit: 38.2 % (ref 34.0–46.6)
Hemoglobin: 12.6 g/dL (ref 11.1–15.9)
Immature Grans (Abs): 0 10*3/uL (ref 0.0–0.1)
Immature Granulocytes: 0 %
Lymphocytes Absolute: 1.1 10*3/uL (ref 0.7–3.1)
Lymphs: 35 %
MCH: 29.9 pg (ref 26.6–33.0)
MCHC: 33 g/dL (ref 31.5–35.7)
MCV: 91 fL (ref 79–97)
Monocytes Absolute: 0.5 10*3/uL (ref 0.1–0.9)
Monocytes: 15 %
Neutrophils Absolute: 1.6 10*3/uL (ref 1.4–7.0)
Neutrophils: 48 %
Platelets: 223 10*3/uL (ref 150–450)
RBC: 4.22 x10E6/uL (ref 3.77–5.28)
RDW: 13.3 % (ref 11.7–15.4)
WBC: 3.3 10*3/uL — ABNORMAL LOW (ref 3.4–10.8)

## 2019-12-01 LAB — LIPID PANEL
Chol/HDL Ratio: 2.9 ratio (ref 0.0–4.4)
Cholesterol, Total: 171 mg/dL (ref 100–199)
HDL: 58 mg/dL (ref >39)
LDL Chol Calc (NIH): 89 mg/dL (ref 0–99)
Triglycerides: 136 mg/dL (ref 0–149)
VLDL Cholesterol Cal: 24 mg/dL (ref 5–40)

## 2019-12-01 LAB — CMP14+EGFR
ALT: 11 IU/L (ref 0–32)
AST: 18 IU/L (ref 0–40)
Albumin/Globulin Ratio: 1.8 (ref 1.2–2.2)
Albumin: 4.4 g/dL (ref 3.6–4.6)
Alkaline Phosphatase: 68 IU/L (ref 39–117)
BUN/Creatinine Ratio: 17 (ref 12–28)
BUN: 14 mg/dL (ref 8–27)
Bilirubin Total: 0.5 mg/dL (ref 0.0–1.2)
CO2: 25 mmol/L (ref 20–29)
Calcium: 9.8 mg/dL (ref 8.7–10.3)
Chloride: 104 mmol/L (ref 96–106)
Creatinine, Ser: 0.84 mg/dL (ref 0.57–1.00)
GFR calc Af Amer: 73 mL/min/{1.73_m2} (ref >59)
GFR calc non Af Amer: 63 mL/min/{1.73_m2} (ref >59)
Globulin, Total: 2.4 g/dL (ref 1.5–4.5)
Glucose: 92 mg/dL (ref 65–99)
Potassium: 4.8 mmol/L (ref 3.5–5.2)
Sodium: 143 mmol/L (ref 134–144)
Total Protein: 6.8 g/dL (ref 6.0–8.5)

## 2019-12-01 LAB — TSH+FREE T4
Free T4: 1.21 ng/dL (ref 0.82–1.77)
TSH: 3.03 u[IU]/mL (ref 0.450–4.500)

## 2019-12-01 NOTE — Progress Notes (Signed)
Hello Megan Chapman,  Your lab result is normal and/or stable.Some minor variations that are not significant are commonly marked abnormal, but do not represent any medical problem for you.  Best regards, Oluwadarasimi Redmon, M.D.

## 2019-12-21 ENCOUNTER — Ambulatory Visit: Payer: Medicare Other | Admitting: Physician Assistant

## 2019-12-28 ENCOUNTER — Ambulatory Visit: Payer: Medicare Other | Admitting: Physician Assistant

## 2019-12-30 ENCOUNTER — Other Ambulatory Visit: Payer: Self-pay | Admitting: Family Medicine

## 2020-01-07 DIAGNOSIS — H903 Sensorineural hearing loss, bilateral: Secondary | ICD-10-CM | POA: Diagnosis not present

## 2020-01-07 DIAGNOSIS — H838X3 Other specified diseases of inner ear, bilateral: Secondary | ICD-10-CM | POA: Diagnosis not present

## 2020-01-26 ENCOUNTER — Ambulatory Visit: Payer: Medicare Other | Admitting: Physician Assistant

## 2020-02-02 ENCOUNTER — Ambulatory Visit (INDEPENDENT_AMBULATORY_CARE_PROVIDER_SITE_OTHER): Payer: Medicare Other | Admitting: Physician Assistant

## 2020-02-02 ENCOUNTER — Encounter: Payer: Self-pay | Admitting: Physician Assistant

## 2020-02-02 VITALS — BP 148/72 | HR 96 | Temp 96.7°F | Ht 62.0 in | Wt 152.0 lb

## 2020-02-02 DIAGNOSIS — K219 Gastro-esophageal reflux disease without esophagitis: Secondary | ICD-10-CM | POA: Diagnosis not present

## 2020-02-02 DIAGNOSIS — R1319 Other dysphagia: Secondary | ICD-10-CM | POA: Diagnosis not present

## 2020-02-02 MED ORDER — PANTOPRAZOLE SODIUM 40 MG PO TBEC: 40.0000 mg | DELAYED_RELEASE_TABLET | Freq: Every day | ORAL | 11 refills | Status: DC

## 2020-02-02 NOTE — Progress Notes (Signed)
Subjective:    Patient ID: Megan Chapman, female    DOB: April 09, 1931, 86 y.o.   MRN: [id number]  HPI Megan Chapman is a pleasant 86 year old white female, new to GI today referred by Dr. Claretta Chapman for evaluation of dysphagia and odynophagia. Patient has not had prior GI history.  She says her current symptoms started 3 or 4 months ago but she probably had milder symptoms as far back as 6 months ago.  She has noticed some progression with more frequent episodes.  She has been having issues with heartburn hiccups burping etc. on an almost daily basis and has been using Tums primarily.  She says she is usually okay first thing in the morning but by afternoon she will start having symptoms.  She is also having regular episodes of dysphagia to solids.  She says a bolus of food will get transiently lodged in her esophagus, is uncomfortable and she will have to drink a lot of water or fluids to try to push the food down.  Generally she can go back to eating after this.  She has not had any episodes requiring regurgitation.  Her appetite has been okay, weight stable, no complaints of abdominal pain. She has not had chronic problems with GERD. No regular blood thinners, does take baby aspirin daily.  Review of Systems Pertinent positive and negative review of systems were noted in the above HPI section.  All other review of systems was otherwise negative.  Outpatient Encounter Medications as of 02/02/2020  Medication Sig  . ALPRAZolam (XANAX) 0.5 MG tablet Take 1 tablet (0.5 mg total) by mouth at bedtime as needed. for sleep  . aspirin 81 MG tablet Take 81 mg by mouth daily.  [name] atorvastatin (LIPITOR) 40 MG tablet Take 1 tablet (40 mg total) by mouth daily.  . Cholecalciferol (VITAMIN D3) 1000 UNITS CAPS Take 2 capsules (2,000 Units total) by mouth daily.  [name] levothyroxine (SYNTHROID) 25 MCG tablet TAKE (1) TABLET DAILY BE- FORE BREAKFAST.  [name] Omega-3 Fatty Acids (FISH OIL) 1200 MG CAPS Take 2 capsules by mouth  daily.  . pantoprazole (PROTONIX) 40 MG tablet Take 1 tablet (40 mg total) by mouth daily.   No facility-administered encounter medications on file as of 02/02/2020.   Allergies  Allergen Reactions  . Amoxicillin   . Biaxin [Clarithromycin]   . Codeine   . Nitrofurantoin Nausea And Vomiting and Nausea Only  . Sulfa Antibiotics    Patient Active Problem List   Diagnosis Date Noted  . Palpitations 07/15/2019  . Essential hypertension 07/15/2019  . Osteoarthritis 04/17/2017  . Hypothyroidism 10/11/2015  . Vitamin D deficiency 10/11/2015  . Hyperlipidemia 06/08/2013   Social History   Socioeconomic History  . Marital status: Widowed    Spouse name: Not on file  . Number of children: 4  . Years of education: 10  . Highest education level: GED or equivalent  Occupational History  . Occupation: retired  Tobacco Use  . Smoking status: Never Smoker  . Smokeless tobacco: Never Used  Substance and Sexual Activity  . Alcohol use: No  . Drug use: No  . Sexual activity: Never    Birth control/protection: Surgical  Other Topics Concern  . Not on file  Social History Narrative   Lives alone.  Widow twice.  Worked until 20 years.     Social Determinants of Health   Financial Resource Strain: Low Risk   . Difficulty of Paying Living Expenses: Not hard at all  Food Insecurity:  No Food Insecurity  . Worried About Charity fundraiser in the Last Year: Never true  . Ran Out of Food in the Last Year: Never true  Transportation Needs: No Transportation Needs  . Lack of Transportation (Medical): No  . Lack of Transportation (Non-Medical): No  Physical Activity: Inactive  . Days of Exercise per Week: 0 days  . Minutes of Exercise per Session: 0 min  Stress: No Stress Concern Present  . Feeling of Stress : Only a little  Social Connections: Slightly Isolated  . Frequency of Communication with Friends and Family: More than three times a week  . Frequency of Social Gatherings with  Friends and Family: More than three times a week  . Attends Religious Services: More than 4 times per year  . Active Member of Clubs or Organizations: Yes  . Attends Archivist Meetings: More than 4 times per year  . Marital Status: Widowed  Intimate Partner Violence: Not At Risk  . Fear of Current or Ex-Partner: No  . Emotionally Abused: No  . Physically Abused: No  . Sexually Abused: No    Ms. Megan Chapman's family history includes COPD in her son; Cancer in her brother, father, and mother; Down syndrome in her daughter; Migraines in her brother and son; Stroke in her brother.      Objective:    Vitals:   02/02/20 1422  BP: (!) 148/72  Pulse: 96  Temp: (!) 96.7 F (35.9 C)    Physical Exam Well-developed well-nourished elderly white female in no acute distress.  Accompanied by her son  Weight, 152 BMI 27.8  HEENT; nontraumatic normocephalic, EOMI, PER R LA, sclera anicteric. Oropharynx; not examined Neck; supple, no JVD Cardiovascular; regular rate and rhythm with S1-S2, no murmur rub or gallop Pulmonary; Clear bilaterally Abdomen; soft, nontender, nondistended, no palpable mass or hepatosplenomegaly, bowel sounds are active Rectal; not done Skin; benign exam, no jaundice rash or appreciable lesions Extremities; no clubbing cyanosis or edema skin warm and dry Neuro/Psych; alert and oriented x4, grossly nonfocal mood and affect appropriate       Assessment & Plan:   #44 86 year old white female with 3 to 53-month history of new dysphagia to solids and intermittent transient odynophagia.  She is also had associated heartburn belching and hiccups occurring on a frequent basis, over the past 4 to 5 months.  Rule out GERD with peptic stricture, rule out neoplasm, rule out motility disorder though less likely  #2 hypertension 3.  Hypothyroidism 4.  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 every  morning Cut meat into tiny pieces and/or avoid until post EGD. Patient will be scheduled for upper endoscopy with probable esophageal dilation with Dr. Ardis Chapman.  Procedure was discussed in detail with the patient including indications risks and benefits and she is agreeable to proceed. We will hold baby aspirin for 2 days prior to procedure.   Buffie Herne S Calib Wadhwa PA-C 02/02/2020   Cc: Megan Fraise, MD

## 2020-02-02 NOTE — Patient Instructions (Addendum)
If you are age 84 or older, your body mass index should be between 23-30. Your Body mass index is 27.8 kg/m. If this is out of the aforementioned range listed, please consider follow up with your Primary Care Provider.  If you are age 34 or younger, your body mass index should be between 19-25. Your Body mass index is 27.8 kg/m. If this is out of the aformentioned range listed, please consider follow up with your Primary Care Provider.    You have been scheduled for an endoscopy. Please follow written instructions given to you at your visit today. If you use inhalers (even only as needed), please bring them with you on the day of your procedure.  We have sent the following medications to your pharmacy for you to pick up at your convenience:  protonix 40 mg  Due to recent changes in healthcare laws, you may see the results of your imaging and laboratory studies on MyChart before your provider has had a chance to review them.  We understand that in some cases there may be results that are confusing or concerning to you. Not all laboratory results come back in the same time frame and the provider may be waiting for multiple results in order to interpret others.  Please give [place] 48 hours in order for your provider to thoroughly review all the results before contacting the office for clarification of your results.

## 2020-02-03 NOTE — Progress Notes (Signed)
I agree with the above note, plan 

## 2020-02-24 ENCOUNTER — Encounter: Payer: Self-pay | Admitting: Gastroenterology

## 2020-02-24 ENCOUNTER — Ambulatory Visit (AMBULATORY_SURGERY_CENTER): Payer: Medicare Other | Admitting: Gastroenterology

## 2020-02-24 ENCOUNTER — Other Ambulatory Visit: Payer: Self-pay

## 2020-02-24 VITALS — BP 174/80 | HR 68 | Temp 98.2°F | Resp 12 | Ht 62.0 in | Wt 152.0 lb

## 2020-02-24 DIAGNOSIS — K219 Gastro-esophageal reflux disease without esophagitis: Secondary | ICD-10-CM | POA: Diagnosis not present

## 2020-02-24 DIAGNOSIS — K222 Esophageal obstruction: Secondary | ICD-10-CM | POA: Diagnosis not present

## 2020-02-24 DIAGNOSIS — R1310 Dysphagia, unspecified: Secondary | ICD-10-CM | POA: Diagnosis not present

## 2020-02-24 DIAGNOSIS — R1319 Other dysphagia: Secondary | ICD-10-CM

## 2020-02-24 MED ORDER — SODIUM CHLORIDE 0.9 % IV SOLN: 500.0000 mL | Freq: Once | INTRAVENOUS | Status: DC

## 2020-02-24 NOTE — Progress Notes (Signed)
Temperature taken by L.C., VS taken by D.T. 

## 2020-02-24 NOTE — Patient Instructions (Addendum)
YOU SHOULD EXPECT: Some feelings of bloating in the abdomen. Passage of more gas than usual.  Walking can help get rid of the air that was put into your GI tract during the procedure and reduce the bloating. If you had a lower endoscopy (such as a colonoscopy or flexible sigmoidoscopy) you may notice spotting of blood in your stool or on the toilet paper. If you underwent a bowel prep for your procedure, you may not have a normal bowel movement for a few days.  Please Note:  You might notice some irritation and congestion in your nose or some drainage.  This is from the oxygen used during your procedure.  There is no need for concern and it should clear up in a day or so.  SYMPTOMS TO REPORT IMMEDIATELY:    Following upper endoscopy (EGD)  Vomiting of blood or coffee ground material  New chest pain or pain under the shoulder blades  Painful or persistently difficult swallowing  New shortness of breath  Fever of 100F or higher  Black, tarry-looking stools  For urgent or emergent issues, a gastroenterologist can be reached at any hour by calling [phone].   DIET:  We do recommend a small meal at first, but then you may proceed to your regular diet.  Drink plenty of fluids but you should avoid alcoholic beverages for 24 hours.  ACTIVITY:  You should plan to take it easy for the rest of today and you should NOT DRIVE or use heavy machinery until tomorrow (because of the sedation medicines used during the test).    FOLLOW UP: Our staff will call the number listed on your records 48-72 hours following your procedure to check on you and address any questions or concerns that you may have regarding the information given to you following your procedure. If we do not reach you, we will leave a message.  We will attempt to reach you two times.  During this call, we will ask if you have developed any symptoms of COVID 19. If you develop any symptoms (ie: fever, flu-like symptoms, shortness of  breath, cough etc.) before then, please call [phone].  If you test positive for Covid 19 in the 2 weeks post procedure, please call and report this information to [place].    If any biopsies were taken you will be contacted by phone or by letter within the next 1-3 weeks.  Please call us at [phone] if you have not heard about the biopsies in 3 weeks.    SIGNATURES/CONFIDENTIALITY: You and/or your care partner have signed paperwork which will be entered into your electronic medical record.  These signatures attest to the fact that that the information above on your After Visit Summary has been reviewed and is understood.  Full responsibility of the confidentiality of this discharge information lies with you and/or your care-partner.

## 2020-02-24 NOTE — Op Note (Signed)
[facility] Patient Name: Megan Chapman Procedure Date: 02/24/2020 2:43 PM MRN: [id number] Endoscopist: Milus Banister , MD Age: 86 Referring MD:  Date of Birth: [birth year] Gender: Female Account #: [id number] Procedure:                Upper GI endoscopy Indications:              Dysphagia Medicines:                Monitored Anesthesia Care Procedure:                Pre-Anesthesia Assessment:                           - Prior to the procedure, a History and Physical                            was performed, and patient medications and                            allergies were reviewed. The patient's tolerance of                            previous anesthesia was also reviewed. The risks                            and benefits of the procedure and the sedation                            options and risks were discussed with the patient.                            All questions were answered, and informed consent                            was obtained. Prior Anticoagulants: The patient has                            taken no previous anticoagulant or antiplatelet                            agents. ASA Grade Assessment: II - A patient with                            mild systemic disease. After reviewing the risks                            and benefits, the patient was deemed in                            satisfactory condition to undergo the procedure.                           After obtaining informed consent, the endoscope was  passed under direct vision. Throughout the                            procedure, the patient's blood pressure, pulse, and                            oxygen saturations were monitored continuously. The                            Endoscope was introduced through the mouth, and                            advanced to the second part of duodenum. The upper                            GI endoscopy was accomplished without  difficulty.                            The patient tolerated the procedure well. Scope In: Scope Out: Findings:                 One benign-appearing, intrinsic moderate stenosis                            was found at the gastroesophageal junction                            (Schatzki's ring vs. focal peptic stricture). This                            stenosis measured 1.2 cm (inner diameter). A TTS                            dilator was passed through the scope. Sequential                            dilation with a 13.5-14.5-15.5 mm balloon and a                            16-17-18 mm balloon dilator was performed. Maximum                            dilation 35mm. There was typical superfical mucosal                            disruption and self limited oozing following                            dilation.                           The exam was otherwise without abnormality. Complications:            No immediate complications. Estimated blood loss:  None. Estimated Blood Loss:     Estimated blood loss: none. Impression:               - Benign-appearing esophageal stenosis (Schatzki's                            ring vs. focal peptic stricture). Dilated up to                            95mm.                           - The examination was otherwise normal. Recommendation:           - Patient has a contact number available for                            emergencies. The signs and symptoms of potential                            delayed complications were discussed with the                            patient. Return to normal activities tomorrow.                            Written discharge instructions were provided to the                            patient.                           - Resume previous diet. Continue to chew your food                            well, eat slowly and take small bites.                           - Continue present medications.  Continue protonix                            one pill once daily. Milus Banister, MD 02/24/2020 3:01:38 PM This report has been signed electronically.

## 2020-02-24 NOTE — Progress Notes (Signed)
Pt tolerated well. VSS. Awake and to recovery. 

## 2020-02-24 NOTE — Progress Notes (Signed)
Called to room to assist during endoscopic procedure.  Patient ID and intended procedure confirmed with present staff. Received instructions for my participation in the procedure from the performing physician.  

## 2020-02-26 ENCOUNTER — Telehealth: Payer: Self-pay

## 2020-02-26 NOTE — Telephone Encounter (Signed)
  Follow up Call-  Call back number 02/24/2020  Post procedure Call Back phone  # [phone]  Permission to leave phone message Yes  Some recent data might be hidden     Patient questions:  Do you have a fever, pain , or abdominal swelling? No. Pain Score  0 *  Have you tolerated food without any problems? Yes.    Have you been able to return to your normal activities? Yes.    Do you have any questions about your discharge instructions: Diet   No. Medications  No. Follow up visit  No.  Do you have questions or concerns about your Care? No.   Patient did state she has a blister on her lip from the bite block placed for the procedure.  She states she is fine but wanted it noted.  Actions: * If pain score is 4 or above: No action needed, pain <4.  1. Have you developed a fever since your procedure? no  2.   Have you had an respiratory symptoms (SOB or cough) since your procedure? no  3.   Have you tested positive for COVID 19 since your procedure no  4.   Have you had any family members/close contacts diagnosed with the COVID 19 since your procedure?  no   If yes to any of these questions please route to Joylene John, RN and Alphonsa Gin, Therapist, sports.

## 2020-03-09 ENCOUNTER — Telehealth: Payer: Self-pay | Admitting: Family Medicine

## 2020-03-09 NOTE — Chronic Care Management (AMB) (Signed)
  Chronic Care Management   Note  03/09/2020 Name: Megan Chapman MRN: [id number] DOB: Jul 20, 1931  Megan Chapman is a 86 y.o. year old female who is a primary care patient of Stacks, Cletus Gash, MD. I reached out to [employer] by phone today in response to a referral sent by Megan Chapman's health plan.     Megan Chapman was given information about Chronic Care Management services today including:  1. CCM service includes personalized support from designated clinical staff supervised by her physician, including individualized plan of care and coordination with other care providers 2. 24/7 contact phone numbers for assistance for urgent and routine care needs. 3. Service will only be billed when office clinical staff spend 20 minutes or more in a month to coordinate care. 4. Only one practitioner may furnish and bill the service in a calendar month. 5. The patient may stop CCM services at any time (effective at the end of the month) by phone call to the office staff. 6. The patient will be responsible for cost sharing (co-pay) of up to 20% of the service fee (after annual deductible is met).  Patient agreed to services and verbal consent obtained.   Follow up plan: Telephone appointment with care management team member scheduled for: 06/13/2020.   [place], [place] [address] Direct Dial: [phone] [email or web address]

## 2020-03-14 ENCOUNTER — Other Ambulatory Visit: Payer: Self-pay | Admitting: *Deleted

## 2020-03-14 MED ORDER — LEVOTHYROXINE SODIUM 25 MCG PO TABS: ORAL_TABLET | ORAL | 1 refills | Status: DC

## 2020-03-14 MED ORDER — ATORVASTATIN CALCIUM 40 MG PO TABS: 40.0000 mg | ORAL_TABLET | Freq: Every day | ORAL | 0 refills | Status: DC

## 2020-03-21 ENCOUNTER — Other Ambulatory Visit: Payer: Self-pay

## 2020-03-21 MED ORDER — PANTOPRAZOLE SODIUM 40 MG PO TBEC: 40.0000 mg | DELAYED_RELEASE_TABLET | Freq: Every day | ORAL | 3 refills | Status: DC

## 2020-03-21 NOTE — Telephone Encounter (Signed)
[facility] requested 90 day supply of pantoprazole be sent in , this has been done.

## 2020-05-25 ENCOUNTER — Other Ambulatory Visit: Payer: Self-pay | Admitting: Family Medicine

## 2020-05-31 ENCOUNTER — Other Ambulatory Visit: Payer: Self-pay

## 2020-05-31 ENCOUNTER — Ambulatory Visit (INDEPENDENT_AMBULATORY_CARE_PROVIDER_SITE_OTHER): Payer: Medicare Other | Admitting: Family Medicine

## 2020-05-31 ENCOUNTER — Encounter: Payer: Self-pay | Admitting: Family Medicine

## 2020-05-31 VITALS — BP 137/76 | HR 68 | Temp 97.2°F | Ht 62.0 in | Wt 150.8 lb

## 2020-05-31 DIAGNOSIS — M159 Polyosteoarthritis, unspecified: Secondary | ICD-10-CM

## 2020-05-31 DIAGNOSIS — R1310 Dysphagia, unspecified: Secondary | ICD-10-CM

## 2020-05-31 DIAGNOSIS — E038 Other specified hypothyroidism: Secondary | ICD-10-CM

## 2020-05-31 DIAGNOSIS — E782 Mixed hyperlipidemia: Secondary | ICD-10-CM | POA: Diagnosis not present

## 2020-05-31 DIAGNOSIS — I10 Essential (primary) hypertension: Secondary | ICD-10-CM

## 2020-05-31 DIAGNOSIS — M8949 Other hypertrophic osteoarthropathy, multiple sites: Secondary | ICD-10-CM

## 2020-05-31 DIAGNOSIS — R1319 Other dysphagia: Secondary | ICD-10-CM

## 2020-05-31 NOTE — Progress Notes (Signed)
Subjective:  Patient ID: Megan Chapman, female    DOB: 1931/05/21  Age: 86 y.o. MRN: [id number]  CC: Follow-up (6 month)   HPI Megan Chapman presents for patient presents for follow-up on  thyroid. The patient has a history of hypothyroidism for many years. It has been stable recently. Pt. denies any change in  voice, loss of hair, heat or cold intolerance. Energy level has been adequate to good. Patient denies constipation and diarrhea. No myxedema. Medication is as noted below. Verified that pt is taking it daily on an empty stomach. Well tolerated.  Patient is no longer taking blood pressure medicines and doing well without elevation of the pressure these days.  She is in today also to review her pain/controlled substance contract. She has been taking Xanax to help her with the anxiety associated with trying to get to sleep at night.  She only takes it to her once every 7 to 10 days.  She has trouble getting to sleep occasionally for what ever reason for instance she might of had heavy exertion during that day.  If she cannot sleep then she starts ruminating over the problems with being alone and her son having cancer then her arthritis will start flaring up and her legs will feel restless. Depression screen [facility] 2/9 05/31/2020 11/30/2019 06/04/2019  Decreased Interest 0 0 0  Down, Depressed, Hopeless 1 0 0  PHQ - 2 Score 1 0 0  Altered sleeping 1 - -  Tired, decreased energy 0 - -  Change in appetite 0 - -  Feeling bad or failure about yourself  0 - -  Trouble concentrating 1 - -  Moving slowly or fidgety/restless 0 - -  Suicidal thoughts 0 - -  PHQ-9 Score 3 - -  Difficult doing work/chores Somewhat difficult - -    History Megan Chapman has a past medical history of Allergy, Anxiety, Arthritis, Asthma, Basal cell carcinoma, Cataract, GERD (gastroesophageal reflux disease), HTN (hypertension), Hyperlipidemia, Migraine, Palpitations, and Thyroid disease.   She has a past surgical history that  includes Partial knee arthroplasty (Left); Abdominal hysterectomy; Meniscus repair (Right); Excision basal cell carcinoma (2017); Cataract extraction (Bilateral); Appendectomy; Tonsillectomy; and Colonoscopy.   Her family history includes COPD in her son; Cancer in her brother, father, and mother; Colon cancer in her mother; Down syndrome in her daughter; Migraines in her brother and son; Stroke in her brother.She reports that she has never smoked. She has never used smokeless tobacco. She reports that she does not drink alcohol or use drugs.    ROS Review of Systems  Constitutional: Negative.   HENT: Negative.   Eyes: Negative for visual disturbance.  Respiratory: Negative for shortness of breath.   Cardiovascular: Negative for chest pain.  Gastrointestinal: Negative for abdominal pain.  Musculoskeletal: Positive for arthralgias (Various joints have arthritis.  Mostly bothered by the toes at nighttime.).    Objective:  BP 137/76   Pulse 68   Temp (!) 97.2 F (36.2 C) (Temporal)   Ht '5\' 2"'  (1.575 m)   Wt 150 lb 12.8 oz (68.4 kg)   BMI 27.58 kg/m   BP Readings from Last 3 Encounters:  05/31/20 137/76  02/24/20 (!) 174/80  02/02/20 (!) 148/72    Wt Readings from Last 3 Encounters:  05/31/20 150 lb 12.8 oz (68.4 kg)  02/24/20 152 lb (68.9 kg)  02/02/20 152 lb (68.9 kg)     Physical Exam Constitutional:      General: She is not in acute  distress.    Appearance: She is well-developed.  HENT:     Head: Normocephalic.     Right Ear: External ear normal.     Left Ear: External ear normal.     Nose: Nose normal.  Eyes:     Conjunctiva/sclera: Conjunctivae normal.     Pupils: Pupils are equal, round, and reactive to light.  Neck:     Thyroid: No thyromegaly.  Cardiovascular:     Rate and Rhythm: Normal rate and regular rhythm.     Heart sounds: Normal heart sounds. No murmur.  Pulmonary:     Effort: Pulmonary effort is normal. No respiratory distress.     Breath  sounds: Normal breath sounds. No wheezing or rales.  Abdominal:     General: Bowel sounds are normal. There is no distension.     Palpations: Abdomen is soft.     Tenderness: There is no abdominal tenderness.  Musculoskeletal:     Cervical back: Normal range of motion and neck supple.  Lymphadenopathy:     Cervical: No cervical adenopathy.  Skin:    General: Skin is warm and dry.  Neurological:     Mental Status: She is alert and oriented to person, place, and time.     Deep Tendon Reflexes: Reflexes are normal and symmetric.  Psychiatric:        Behavior: Behavior normal.        Thought Content: Thought content normal.        Judgment: Judgment normal.       Assessment & Plan:   Megan Chapman was seen today for follow-up.  Diagnoses and all orders for this visit:  Other specified hypothyroidism -     CBC with Differential/Platelet -     CMP14+EGFR -     TSH + free T4  Mixed hyperlipidemia -     CBC with Differential/Platelet -     CMP14+EGFR -     Lipid panel  Esophageal dysphagia -     CBC with Differential/Platelet -     CMP14+EGFR  Primary osteoarthritis involving multiple joints  Essential hypertension    Currently no medication for blood pressure.  Today's blood pressure reading looks appropriate.  She is continuing the thyroid medicine, cholesterol medicine, esophageal dysphagia medicine.  We will keep an eye on her blood pressure.  Additionally she signed a controlled substance contract today regarding her use of Xanax.   I am having Megan Chapman maintain her aspirin, Vitamin D3, Fish Oil, ALPRAZolam, atorvastatin, pantoprazole, and levothyroxine.  Allergies as of 05/31/2020      Reactions   Amoxicillin    Biaxin [clarithromycin]    Codeine    Nitrofurantoin Nausea And Vomiting, Nausea Only   Sulfa Antibiotics       Medication List       Accurate as of May 31, 2020  9:28 AM. If you have any questions, ask your nurse or doctor.        ALPRAZolam 0.5  MG tablet Commonly known as: XANAX Take 1 tablet (0.5 mg total) by mouth at bedtime as needed. for sleep   aspirin 81 MG tablet Take 81 mg by mouth daily.   atorvastatin 40 MG tablet Commonly known as: LIPITOR Take 1 tablet (40 mg total) by mouth daily.   Fish Oil 1200 MG Caps Take 2 capsules by mouth daily.   levothyroxine 25 MCG tablet Commonly known as: SYNTHROID TAKE (1) TABLET DAILY BE- FORE BREAKFAST.   pantoprazole 40 MG tablet Commonly known as:  PROTONIX Take 1 tablet (40 mg total) by mouth daily.   Vitamin D3 25 MCG (1000 UT) Caps Take 2 capsules (2,000 Units total) by mouth daily.        Follow-up: Return in about 6 months (around 11/30/2020).  [name], M.D.

## 2020-06-01 LAB — CBC WITH DIFFERENTIAL/PLATELET
Basophils Absolute: 0 10*3/uL (ref 0.0–0.2)
Basos: 0 %
EOS (ABSOLUTE): 0.1 10*3/uL (ref 0.0–0.4)
Eos: 2 %
Hematocrit: 37.3 % (ref 34.0–46.6)
Hemoglobin: 12.4 g/dL (ref 11.1–15.9)
Immature Grans (Abs): 0 10*3/uL (ref 0.0–0.1)
Immature Granulocytes: 0 %
Lymphocytes Absolute: 1.3 10*3/uL (ref 0.7–3.1)
Lymphs: 37 %
MCH: 29 pg (ref 26.6–33.0)
MCHC: 33.2 g/dL (ref 31.5–35.7)
MCV: 87 fL (ref 79–97)
Monocytes Absolute: 0.6 10*3/uL (ref 0.1–0.9)
Monocytes: 16 %
Neutrophils Absolute: 1.6 10*3/uL (ref 1.4–7.0)
Neutrophils: 45 %
Platelets: 227 10*3/uL (ref 150–450)
RBC: 4.28 x10E6/uL (ref 3.77–5.28)
RDW: 13.2 % (ref 11.7–15.4)
WBC: 3.5 10*3/uL (ref 3.4–10.8)

## 2020-06-01 LAB — CMP14+EGFR
ALT: 10 IU/L (ref 0–32)
AST: 15 IU/L (ref 0–40)
Albumin/Globulin Ratio: 1.6 (ref 1.2–2.2)
Albumin: 4.4 g/dL (ref 3.6–4.6)
Alkaline Phosphatase: 66 IU/L (ref 48–121)
BUN/Creatinine Ratio: 21 (ref 12–28)
BUN: 19 mg/dL (ref 8–27)
Bilirubin Total: 0.5 mg/dL (ref 0.0–1.2)
CO2: 23 mmol/L (ref 20–29)
Calcium: 9.7 mg/dL (ref 8.7–10.3)
Chloride: 101 mmol/L (ref 96–106)
Creatinine, Ser: 0.9 mg/dL (ref 0.57–1.00)
GFR calc Af Amer: 67 mL/min/{1.73_m2} (ref >59)
GFR calc non Af Amer: 58 mL/min/{1.73_m2} — ABNORMAL LOW (ref >59)
Globulin, Total: 2.7 g/dL (ref 1.5–4.5)
Glucose: 89 mg/dL (ref 65–99)
Potassium: 4.7 mmol/L (ref 3.5–5.2)
Sodium: 140 mmol/L (ref 134–144)
Total Protein: 7.1 g/dL (ref 6.0–8.5)

## 2020-06-01 LAB — LIPID PANEL
Chol/HDL Ratio: 3.1 ratio (ref 0.0–4.4)
Cholesterol, Total: 176 mg/dL (ref 100–199)
HDL: 57 mg/dL (ref >39)
LDL Chol Calc (NIH): 97 mg/dL (ref 0–99)
Triglycerides: 127 mg/dL (ref 0–149)
VLDL Cholesterol Cal: 22 mg/dL (ref 5–40)

## 2020-06-01 LAB — TSH+FREE T4
Free T4: 1.23 ng/dL (ref 0.82–1.77)
TSH: 4.33 u[IU]/mL (ref 0.450–4.500)

## 2020-06-03 NOTE — Progress Notes (Signed)
Hello Haliyah,  Your lab result is normal and/or stable.Some minor variations that are not significant are commonly marked abnormal, but do not represent any medical problem for you.  Best regards, Sherrod Toothman, M.D.

## 2020-06-13 ENCOUNTER — Ambulatory Visit (INDEPENDENT_AMBULATORY_CARE_PROVIDER_SITE_OTHER): Payer: Medicare Other | Admitting: *Deleted

## 2020-06-13 DIAGNOSIS — I10 Essential (primary) hypertension: Secondary | ICD-10-CM | POA: Diagnosis not present

## 2020-06-13 DIAGNOSIS — E782 Mixed hyperlipidemia: Secondary | ICD-10-CM | POA: Diagnosis not present

## 2020-06-13 DIAGNOSIS — E038 Other specified hypothyroidism: Secondary | ICD-10-CM | POA: Diagnosis not present

## 2020-06-13 NOTE — Patient Instructions (Signed)
Visit Information  Goals Addressed            This Visit's Progress   . Chronic Disease Management Needs       CARE PLAN ENTRY (see longtitudinal plan of care for additional care plan information)  Current Barriers:  . Chronic Disease Management support, education, and care coordination needs related to HTN, hypothyroidism, OA, HLD  Clinical Goal(s) related to HTN, hypothyroidism, OA, HLD:  Over the next 60 days, patient will:  . Work with the care management team to address educational, disease management, and care coordination needs  . Call provider office for new or worsened signs and symptoms  . Call care management team with questions or concerns . Verbalize basic understanding of patient centered plan of care established today  Interventions related to HTN, hypothyroidism, OA, HLD:  . Evaluation of current treatment plans and patient's adherence to plan as established by provider . Assessed patient understanding of disease states . Assessed patient's education and care coordination needs . Provided disease specific education to patient  . Collaborated with appropriate clinical care team members regarding patient needs . Provided with RNCM telephone number and encouraged to reach out as needed . Scheduled f/u with RNCM for 08/08/20  Patient Self Care Activities related to HTN, hypothyroidism, OA, HLD:  . Patient is able to perform ADLs and IADLs independently . Patient needs assistance with disease management and care coordination    Initial goal documentation        Ms. Megan Chapman was given information about Chronic Care Management services today including:  1. CCM service includes personalized support from designated clinical staff supervised by her physician, including individualized plan of care and coordination with other care providers 2. 24/7 contact phone numbers for assistance for urgent and routine care needs. 3. Service will only be billed when office clinical  staff spend 20 minutes or more in a month to coordinate care. 4. Only one practitioner may furnish and bill the service in a calendar month. 5. The patient may stop CCM services at any time (effective at the end of the month) by phone call to the office staff. 6. The patient will be responsible for cost sharing (co-pay) of up to 20% of the service fee (after annual deductible is met).  Patient agreed to services and verbal consent obtained.   The patient verbalized understanding of instructions provided today and declined a print copy of patient instruction materials.   Telephone follow up appointment with care management team member scheduled for: 08/08/2020  Chong Sicilian, BSN, RN-BC [place] [facility] [place] [facility] Direct Dial: [phone]

## 2020-06-13 NOTE — Chronic Care Management (AMB) (Signed)
Chronic Care Management   Initial Visit Note  06/13/2020 Name: Megan Chapman MRN: [id number] DOB: 05/17/1931  Referred by: [name], MD Reason for referral : Chronic Care Management (Initial Visit)   Megan Chapman is a 86 y.o. year old female who is a primary care patient of Stacks, Cletus Gash, MD. The CCM team was consulted for assistance with chronic disease management and care coordination needs related to HTN, hypothyroidism, OA, HLD.  Review of patient status, including review of consultants reports, relevant laboratory and other test results, and collaboration with appropriate care team members and the patient's provider was performed as part of comprehensive patient evaluation and provision of chronic care management services.    Subjective: I spoke with Megan Chapman by telephone today regarding management of her chronic medical conditions.   SDOH (Social Determinants of Health) assessments performed: Yes See Care Plan activities for detailed interventions related to SDOH    Objective: Outpatient Encounter Medications as of 06/13/2020  Medication Sig  . ALPRAZolam (XANAX) 0.5 MG tablet Take 1 tablet (0.5 mg total) by mouth at bedtime as needed. for sleep  . aspirin 81 MG tablet Take 81 mg by mouth daily.  [name] atorvastatin (LIPITOR) 40 MG tablet Take 1 tablet (40 mg total) by mouth daily.  . Cholecalciferol (VITAMIN D3) 1000 UNITS CAPS Take 2 capsules (2,000 Units total) by mouth daily.  [name] levothyroxine (SYNTHROID) 25 MCG tablet TAKE (1) TABLET DAILY BE- FORE BREAKFAST.  [name] Omega-3 Fatty Acids (FISH OIL) 1200 MG CAPS Take 2 capsules by mouth daily.  . pantoprazole (PROTONIX) 40 MG tablet Take 1 tablet (40 mg total) by mouth daily.   No facility-administered encounter medications on file as of 06/13/2020.    BP Readings from Last 3 Encounters:  05/31/20 137/76  02/24/20 (!) 174/80  02/02/20 (!) 148/72   Lab Results  Component Value Date   TSH 4.330 05/31/2020   Lab Results    Component Value Date   CHOL 176 05/31/2020   HDL 57 05/31/2020   LDLCALC 97 05/31/2020   TRIG 127 05/31/2020   CHOLHDL 3.1 05/31/2020       RN Care Plan   . Chronic Disease Management Needs       CARE PLAN ENTRY (see longtitudinal plan of care for additional care plan information)  Current Barriers:  . Chronic Disease Management support, education, and care coordination needs related to HTN, hypothyroidism, OA, HLD  Clinical Goal(s) related to HTN, hypothyroidism, OA, HLD:  Over the next 60 days, patient will:  . Work with the care management team to address educational, disease management, and care coordination needs  . Call provider office for new or worsened signs and symptoms  . Call care management team with questions or concerns . Verbalize basic understanding of patient centered plan of care established today  Interventions related to HTN, hypothyroidism, OA, HLD:  . Evaluation of current treatment plans and patient's adherence to plan as established by provider . Assessed patient understanding of disease states . Assessed patient's education and care coordination needs . Provided disease specific education to patient  . Collaborated with appropriate clinical care team members regarding patient needs . Provided with RNCM telephone number and encouraged to reach out as needed . Scheduled f/u with RNCM for 08/08/20  Patient Self Care Activities related to HTN, hypothyroidism, OA, HLD:  . Patient is able to perform ADLs and IADLs independently . Patient needs assistance with disease management and care coordination    Initial goal documentation  Plan:  The care management team will reach out to the patient again over the next 60 days.   Chong Sicilian, BSN, RN-BC Embedded Chronic Care Manager [facility] [place] [facility] Direct Dial: [phone]

## 2020-07-06 ENCOUNTER — Ambulatory Visit (INDEPENDENT_AMBULATORY_CARE_PROVIDER_SITE_OTHER): Payer: Medicare Other | Admitting: *Deleted

## 2020-07-06 DIAGNOSIS — Z0000 Encounter for general adult medical examination without abnormal findings: Secondary | ICD-10-CM | POA: Diagnosis not present

## 2020-07-06 NOTE — Progress Notes (Signed)
MEDICARE ANNUAL WELLNESS VISIT  07/06/2020  Telephone Visit Disclaimer This Medicare AWV was conducted by telephone due to national recommendations for restrictions regarding the COVID-19 Pandemic (e.g. social distancing).  I verified, using two identifiers, that I am speaking with Megan Chapman or their authorized healthcare agent. I discussed the limitations, risks, security, and privacy concerns of performing an evaluation and management service by telephone and the potential availability of an in-person appointment in the future. The patient expressed understanding and agreed to proceed.   Subjective:  Megan Chapman is a 86 y.o. female patient of Stacks, Cletus Gash, MD who had a Medicare Annual Wellness Visit today via telephone. Megan Chapman is Retired and lives alone. she has 4 children. she reports that she is socially active and does interact with friends/family regularly. she is not physically active and enjoys cooking and watching TV especially the local news and weather.  Patient Care Team: [name], MD as PCP - General (Family Medicine) Levy Sjogren, MD as Referring Physician (Dermatology) Henreitta Leber, MD as Referring Physician (Orthopedic Surgery) Ilean China, RN as Registered Nurse  Advanced Directives 07/06/2020 06/04/2019 10/22/2017 08/22/2016 08/22/2016 04/07/2015  Does Patient Have a Medical Advance Directive? _0  Yes  Type of Paramedic of [place];Living will [place];Living will - [place];Living will [place];Living will Living will;Healthcare Power of Attorney  Does patient want to make changes to medical advance directive? No - Patient declined No - Patient declined - No - Patient declined - No - Patient declined  Copy of [place] in Chart? No - copy requested No - copy requested - No - copy requested No - copy requested No - copy requested     Hospital Utilization Over the Past 12 Months: # of hospitalizations or ER visits: 0 # of surgeries: 0  Review of Systems    Patient reports that her overall health is worse compared to last year.  History obtained from chart review and the patient  Patient Reported Readings (BP, Pulse, CBG, Weight, etc) none  Pain Assessment Pain : No/denies pain     Current Medications & Allergies (verified) Allergies as of 07/06/2020      Reactions   Amoxicillin    Biaxin [clarithromycin]    Codeine    Nitrofurantoin Nausea And Vomiting, Nausea Only   Sulfa Antibiotics       Medication List       Accurate as of July 06, 2020  9:42 AM. If you have any questions, ask your nurse or doctor.        ALPRAZolam 0.5 MG tablet Commonly known as: XANAX Take 1 tablet (0.5 mg total) by mouth at bedtime as needed. for sleep   aspirin 81 MG tablet Take 81 mg by mouth daily.   atorvastatin 40 MG tablet Commonly known as: LIPITOR Take 1 tablet (40 mg total) by mouth daily.   Fish Oil 1200 MG Caps Take 2 capsules by mouth daily.   levothyroxine 25 MCG tablet Commonly known as: SYNTHROID TAKE (1) TABLET DAILY BE- FORE BREAKFAST.   pantoprazole 40 MG tablet Commonly known as: PROTONIX Take 1 tablet (40 mg total) by mouth daily.   Vitamin D3 25 MCG (1000 UT) Caps Take 2 capsules (2,000 Units total) by mouth daily.       History (reviewed): Past Medical History:  Diagnosis Date  . Allergy   . Anxiety   . Arthritis   .  Asthma    not treated  . Basal cell carcinoma   . Cataract   . GERD (gastroesophageal reflux disease)    Not treated  . HTN (hypertension)   . Hyperlipidemia   . Migraine   . Palpitations   . Thyroid disease    hypothyroid   Past Surgical History:  Procedure Laterality Date  . ABDOMINAL HYSTERECTOMY     partial  . APPENDECTOMY    . BASAL CELL CARCINOMA EXCISION  2017   nose  . CATARACT EXTRACTION Bilateral   . COLONOSCOPY    . MENISCUS REPAIR  Right    03/11/15  . PARTIAL KNEE ARTHROPLASTY Left   . TONSILLECTOMY     Family History  Problem Relation Age of Onset  . Cancer Mother        colon met to lung  . Colon cancer Mother   . Cancer Father        prostate - met to bone  . Cancer Brother   . Stroke Brother   . Migraines Brother   . COPD Son   . Migraines Son   . Down syndrome Daughter   . Esophageal cancer Neg Hx   . Rectal cancer Neg Hx   . Stomach cancer Neg Hx    Social History   Socioeconomic History  . Marital status: Widowed    Spouse name: Not on file  . Number of children: 4  . Years of education: 10  . Highest education level: GED or equivalent  Occupational History  . Occupation: retired  Tobacco Use  . Smoking status: Never Smoker  . Smokeless tobacco: Never Used  Vaping Use  . Vaping Use: Never used  Substance and Sexual Activity  . Alcohol use: No  . Drug use: No  . Sexual activity: Not Currently    Birth control/protection: Surgical  Other Topics Concern  . Not on file  Social History Narrative   Lives alone.  Widow twice.  Worked until 53 years.     Social Determinants of Health   Financial Resource Strain: Low Risk   . Difficulty of Paying Living Expenses: Not hard at all  Food Insecurity: No Food Insecurity  . Worried About Charity fundraiser in the Last Year: Never true  . Ran Out of Food in the Last Year: Never true  Transportation Needs: No Transportation Needs  . Lack of Transportation (Medical): No  . Lack of Transportation (Non-Medical): No  Physical Activity: Inactive  . Days of Exercise per Week: 0 days  . Minutes of Exercise per Session: 0 min  Stress: No Stress Concern Present  . Feeling of Stress : Not at all  Social Connections: Moderately Integrated  . Frequency of Communication with Friends and Family: More than three times a week  . Frequency of Social Gatherings with Friends and Family: More than three times a week  . Attends Religious Services: More than 4  times per year  . Active Member of Clubs or Organizations: Yes  . Attends Archivist Meetings: More than 4 times per year  . Marital Status: Widowed    Activities of Daily Living In your present state of health, do you have any difficulty performing the following activities: 07/06/2020 06/13/2020  Hearing? Megan Chapman  Comment wears bilateral hearing aids using hearing aids  Vision? N N  Comment - cataract removal 12 years ago. Wears glasses now  Difficulty concentrating or making decisions? N N  Walking or climbing stairs? Megan Chapman  Comment sometimes when her hip pain flares up -  Dressing or bathing? N N  Doing errands, shopping? N N  Preparing Food and eating ? N N  Using the Toilet? N N  In the past six months, have you accidently leaked urine? N N  Do you have problems with loss of bowel control? N N  Managing your Medications? N N  Managing your Finances? N N  Housekeeping or managing your Housekeeping? N N  Some recent data might be hidden    Patient Education/ Literacy How often do you need to have someone help you when you read instructions, pamphlets, or other written materials from your doctor or pharmacy?: 3 - Sometimes (she can read but doesn't always comprehend what she is reading) What is the last grade level you completed in school?: 9th grade then went to [employer] to get her GED  Exercise Current Exercise Habits: The patient does not participate in regular exercise at present, Exercise limited by: orthopedic condition(s)  Diet Patient reports consuming 2 meals a day and 2 snack(s) a day Patient reports that her primary diet is: Regular Patient reports that she does have regular access to food.   Depression Screen PHQ 2/9 Scores 07/06/2020 05/31/2020 11/30/2019 06/04/2019 05/29/2019 10/14/2018 04/14/2018  PHQ - 2 Score 1 1 0 0 1 0 0  PHQ- 9 Score 3 3 - - 2 - -     Fall Risk Fall Risk  07/06/2020 06/13/2020 05/31/2020 11/30/2019 06/04/2019  Falls in the past year? 0 0 0 0 0   Number falls in past yr: - 0 0 - -  Injury with Fall? - 0 0 - -  Risk for fall due to : - - No Fall Risks - -  Follow up - Falls prevention discussed Falls evaluation completed - -     Objective:  Megan Chapman seemed alert and oriented and she participated appropriately during our telephone visit.  Blood Pressure Weight BMI  BP Readings from Last 3 Encounters:  05/31/20 137/76  02/24/20 (!) 174/80  02/02/20 (!) 148/72   Wt Readings from Last 3 Encounters:  05/31/20 150 lb 12.8 oz (68.4 kg)  02/24/20 152 lb (68.9 kg)  02/02/20 152 lb (68.9 kg)   BMI Readings from Last 1 Encounters:  05/31/20 27.58 kg/m    *Unable to obtain current vital signs, weight, and BMI due to telephone visit type  Hearing/Vision  . Megan Chapman did not seem to have difficulty with hearing/understanding during the telephone conversation . Reports that she has not had a formal eye exam by an eye care professional within the past year . Reports that she has not had a formal hearing evaluation within the past year *Unable to fully assess hearing and vision during telephone visit type  Cognitive Function: 6CIT Screen 07/06/2020 06/04/2019  What Year? 0 points 0 points  What month? 0 points 0 points  What time? 0 points 0 points  Count back from 20 0 points 0 points  Months in reverse 0 points 0 points  Repeat phrase 2 points 2 points  Total Score 2 2   (Normal:0-7, Significant for Dysfunction: >8)  Normal Cognitive Function Screening: Yes   Immunization & Health Maintenance Record Immunization History  Administered Date(s) Administered  . Fluad Quad(high Dose 65+) 09/29/2019  . Influenza, High Dose Seasonal PF 10/14/2017, 10/14/2018  . Influenza,inj,Quad PF,6+ Mos 11/15/2014, 10/12/2016  . Moderna SARS-COVID-2 Vaccination 01/13/2020, 02/10/2020  . Pneumococcal Conjugate-13 04/14/2018  . Pneumococcal Polysaccharide-23 11/30/2002  .  Td 05/31/2005    Health Maintenance  Topic Date Due  . MAMMOGRAM   08/22/2017  . DEXA SCAN  04/04/2020  . TETANUS/TDAP  05/31/2021 (Originally 06/01/2015)  . INFLUENZA VACCINE  07/31/2020  . COVID-19 Vaccine  Completed  . PNA vac Low Risk Adult  Completed       Assessment  This is a routine wellness examination for Megan Chapman.  Health Maintenance: Due or Overdue Health Maintenance Due  Topic Date Due  . MAMMOGRAM  08/22/2017  . DEXA SCAN  04/04/2020    Megan Chapman does not need a referral for Community Assistance: Care Management:   Enrolled with Megan Sicilian, RN Social Work:    no Prescription Assistance:  no Nutrition/Diabetes Education:  no   Plan:  Personalized Goals Goals Addressed            This Visit's Progress   . DIET - INCREASE WATER INTAKE       Try to drink 6-8 glasses of water daily.      Personalized Health Maintenance & Screening Recommendations  Td vaccine Advanced directives: has an advanced directive - a copy HAS NOT been provided. Shingrix vaccine  Lung Cancer Screening Recommended: no (Low Dose CT Chest recommended if Age 63-80 years, 30 pack-year currently smoking OR have quit w/in past 15 years) Hepatitis C Screening recommended: no HIV Screening recommended: no  Advanced Directives: Written information was not prepared per patient's request.  Referrals & Orders No orders of the defined types were placed in this encounter.   Follow-up Plan . Follow-up with [name], MD as planned . Consider TDAP and Shingrix vaccines at your next visit with your PCP . Bring a copy of your Advanced Directives in for our records   I have personally reviewed and noted the following in the patient's chart:   . Medical and social history . Use of alcohol, tobacco or illicit drugs  . Current medications and supplements . Functional ability and status . Nutritional status . Physical activity . Advanced directives . List of other physicians . Hospitalizations, surgeries, and ER visits in previous 12  months . Vitals . Screenings to include cognitive, depression, and falls . Referrals and appointments  In addition, I have reviewed and discussed with Megan Chapman certain preventive protocols, quality metrics, and best practice recommendations. A written personalized care plan for preventive services as well as general preventive health recommendations is available and can be mailed to the patient at her request.      Milas Hock, LPN  [id number]

## 2020-07-06 NOTE — Patient Instructions (Signed)
Preventive Care 84 Years and Older, Female Preventive care refers to lifestyle choices and visits with your health care provider that can promote health and wellness. This includes:  A yearly physical exam. This is also called an annual well check.  Regular dental and eye exams.  Immunizations.  Screening for certain conditions.  Healthy lifestyle choices, such as diet and exercise. What can I expect for my preventive care visit? Physical exam Your health care provider will check:  Height and weight. These may be used to calculate body mass index (BMI), which is a measurement that tells if you are at a healthy weight.  Heart rate and blood pressure.  Your skin for abnormal spots. Counseling Your health care provider may ask you questions about:  Alcohol, tobacco, and drug use.  Emotional well-being.  Home and relationship well-being.  Sexual activity.  Eating habits.  History of falls.  Memory and ability to understand (cognition).  Work and work Statistician.  Pregnancy and menstrual history. What immunizations do I need?  Influenza (flu) vaccine  This is recommended every year. Tetanus, diphtheria, and pertussis (Tdap) vaccine  You may need a Td booster every 10 years. Varicella (chickenpox) vaccine  You may need this vaccine if you have not already been vaccinated. Zoster (shingles) vaccine  You may need this after age 84. Pneumococcal conjugate (PCV13) vaccine  One dose is recommended after age 84. Pneumococcal polysaccharide (PPSV23) vaccine  One dose is recommended after age 84. Measles, mumps, and rubella (MMR) vaccine  You may need at least one dose of MMR if you were born in 1957 or later. You may also need a second dose. Meningococcal conjugate (MenACWY) vaccine  You may need this if you have certain conditions. Hepatitis A vaccine  You may need this if you have certain conditions or if you travel or work in places where you may be exposed  to hepatitis A. Hepatitis B vaccine  You may need this if you have certain conditions or if you travel or work in places where you may be exposed to hepatitis B. Haemophilus influenzae type b (Hib) vaccine  You may need this if you have certain conditions. You may receive vaccines as individual doses or as more than one vaccine together in one shot (combination vaccines). Talk with your health care provider about the risks and benefits of combination vaccines. What tests do I need? Blood tests  Lipid and cholesterol levels. These may be checked every 5 years, or more frequently depending on your overall health.  Hepatitis C test.  Hepatitis B test. Screening  Lung cancer screening. You may have this screening every year starting at age 84 if you have a 30-pack-year history of smoking and currently smoke or have quit within the past 15 years.  Colorectal cancer screening. All adults should have this screening starting at age 84 and continuing until age 84. Your health care provider may recommend screening at age 84 if you are at increased risk. You will have tests every 1-10 years, depending on your results and the type of screening test.  Diabetes screening. This is done by checking your blood sugar (glucose) after you have not eaten for a while (fasting). You may have this done every 1-3 years.  Mammogram. This may be done every 1-2 years. Talk with your health care provider about how often you should have regular mammograms.  BRCA-related cancer screening. This may be done if you have a family history of breast, ovarian, tubal, or peritoneal cancers.  Other tests  Sexually transmitted disease (STD) testing.  Bone density scan. This is done to screen for osteoporosis. You may have this done starting at age 84. Follow these instructions at home: Eating and drinking  Eat a diet that includes fresh fruits and vegetables, whole grains, lean protein, and low-fat dairy products. Limit  your intake of foods with high amounts of sugar, saturated fats, and salt.  Take vitamin and mineral supplements as recommended by your health care provider.  Do not drink alcohol if your health care provider tells you not to drink.  If you drink alcohol: ? Limit how much you have to 0-1 drink a day. ? Be aware of how much alcohol is in your drink. In the [place] one drink equals one 12 oz bottle of beer (355 mL), one 5 oz glass of wine (148 mL), or one 1 oz glass of hard liquor (44 mL). Lifestyle  Take daily care of your teeth and gums.  Stay active. Exercise for at least 30 minutes on 5 or more days each week.  Do not use any products that contain nicotine or tobacco, such as cigarettes, e-cigarettes, and chewing tobacco. If you need help quitting, ask your health care provider.  If you are sexually active, practice safe sex. Use a condom or other form of protection in order to prevent STIs (sexually transmitted infections).  Talk with your health care provider about taking a low-dose aspirin or statin. What's next?  Go to your health care provider once a year for a well check visit.  Ask your health care provider how often you should have your eyes and teeth checked.  Stay up to date on all vaccines. This information is not intended to replace advice given to you by your health care provider. Make sure you discuss any questions you have with your health care provider. Document Revised: 12/11/2018 Document Reviewed: 12/11/2018 Elsevier Patient Education  2020 [employer].

## 2020-07-13 ENCOUNTER — Ambulatory Visit (INDEPENDENT_AMBULATORY_CARE_PROVIDER_SITE_OTHER): Payer: Medicare Other | Admitting: Family Medicine

## 2020-07-13 ENCOUNTER — Ambulatory Visit (INDEPENDENT_AMBULATORY_CARE_PROVIDER_SITE_OTHER): Payer: Medicare Other

## 2020-07-13 ENCOUNTER — Other Ambulatory Visit: Payer: Self-pay

## 2020-07-13 ENCOUNTER — Encounter: Payer: Self-pay | Admitting: Family Medicine

## 2020-07-13 VITALS — BP 130/79 | HR 77 | Temp 98.0°F | Resp 20 | Ht 62.0 in | Wt 149.4 lb

## 2020-07-13 DIAGNOSIS — M5030 Other cervical disc degeneration, unspecified cervical region: Secondary | ICD-10-CM | POA: Diagnosis not present

## 2020-07-13 DIAGNOSIS — M542 Cervicalgia: Secondary | ICD-10-CM

## 2020-07-13 MED ORDER — PREDNISONE 10 MG PO TABS
ORAL_TABLET | ORAL | 0 refills | Status: DC
Start: 2020-07-13 — End: 2020-10-04

## 2020-07-13 NOTE — Progress Notes (Signed)
Subjective:  Patient ID: Megan Chapman, female    DOB: 06/05/1931  Age: 86 y.o. MRN: [id number]  CC: Neck Pain   HPI [name] presents for  Three weeks of neck pain.  She was stung by a bee.  As result she jerked her neck to the side and pain started then and has been continuing ever since.  It is worse at the left shoulder area.  There is some numbness and tingling into the left upper extremity.  Depression screen [facility] 2/9 07/13/2020 07/06/2020 05/31/2020  Decreased Interest 0 0 0  Down, Depressed, Hopeless 0 1 1  PHQ - 2 Score 0 1 1  Altered sleeping - 1 1  Tired, decreased energy - 0 0  Change in appetite - 0 0  Feeling bad or failure about yourself  - 0 0  Trouble concentrating - 1 1  Moving slowly or fidgety/restless - 0 0  Suicidal thoughts - 0 0  PHQ-9 Score - 3 3  Difficult doing work/chores - Not difficult at all Somewhat difficult  Some recent data might be hidden    History [name] has a past medical history of Allergy, Anxiety, Arthritis, Asthma, Basal cell carcinoma, Cataract, GERD (gastroesophageal reflux disease), HTN (hypertension), Hyperlipidemia, Migraine, Palpitations, and Thyroid disease.   She has a past surgical history that includes Partial knee arthroplasty (Left); Abdominal hysterectomy; Meniscus repair (Right); Excision basal cell carcinoma (2017); Cataract extraction (Bilateral); Appendectomy; Tonsillectomy; and Colonoscopy.   Her family history includes COPD in her son; Cancer in her brother, father, and mother; Colon cancer in her mother; Down syndrome in her daughter; Migraines in her brother and son; Stroke in her brother.She reports that she has never smoked. She has never used smokeless tobacco. She reports that she does not drink alcohol and does not use drugs.    ROS Review of Systems  Constitutional: Negative.   HENT: Negative.   Eyes: Negative for visual disturbance.  Respiratory: Negative for shortness of breath.   Cardiovascular: Negative for  chest pain.  Gastrointestinal: Negative for abdominal pain.  Musculoskeletal: Positive for arthralgias, myalgias and neck pain.    Objective:  BP 130/79   Pulse 77   Temp 98 F (36.7 C) (Temporal)   Resp 20   Ht 5\' 2"  (1.575 m)   Wt 149 lb 6 oz (67.8 kg)   SpO2 95%   BMI 27.32 kg/m   BP Readings from Last 3 Encounters:  07/13/20 130/79  05/31/20 137/76  02/24/20 (!) 174/80    Wt Readings from Last 3 Encounters:  07/13/20 149 lb 6 oz (67.8 kg)  05/31/20 150 lb 12.8 oz (68.4 kg)  02/24/20 152 lb (68.9 kg)     Physical Exam Constitutional:      Appearance: She is well-developed.  HENT:     Head: Normocephalic.  Cardiovascular:     Rate and Rhythm: Normal rate and regular rhythm.     Heart sounds: No murmur heard.   Pulmonary:     Effort: Pulmonary effort is normal.     Breath sounds: Normal breath sounds.  Musculoskeletal:        General: Tenderness (base of neck at left shoulder) present. Normal range of motion.       Assessment & Plan:   Jmya was seen today for neck pain.  Diagnoses and all orders for this visit:  Cervicalgia -     DG Cervical Spine Complete; Future  Other orders -     predniSONE (DELTASONE) 10 MG  tablet; Take 5 daily for 3 days followed by 4,3,2 and 1 for 3 days each.       I am having [name] start on predniSONE. I am also having her maintain her aspirin, Vitamin D3, Fish Oil, ALPRAZolam, atorvastatin, pantoprazole, and levothyroxine.  Allergies as of 07/13/2020      Reactions   Amoxicillin    Biaxin [clarithromycin]    Codeine    Nitrofurantoin Nausea And Vomiting, Nausea Only   Sulfa Antibiotics       Medication List       Accurate as of July 13, 2020 11:59 PM. If you have any questions, ask your nurse or doctor.        ALPRAZolam 0.5 MG tablet Commonly known as: XANAX Take 1 tablet (0.5 mg total) by mouth at bedtime as needed. for sleep   aspirin 81 MG tablet Take 81 mg by mouth daily.   atorvastatin  40 MG tablet Commonly known as: LIPITOR Take 1 tablet (40 mg total) by mouth daily.   Fish Oil 1200 MG Caps Take 2 capsules by mouth daily.   levothyroxine 25 MCG tablet Commonly known as: SYNTHROID TAKE (1) TABLET DAILY BE- FORE BREAKFAST.   pantoprazole 40 MG tablet Commonly known as: PROTONIX Take 1 tablet (40 mg total) by mouth daily.   predniSONE 10 MG tablet Commonly known as: DELTASONE Take 5 daily for 3 days followed by 4,3,2 and 1 for 3 days each. Started by: [name], MD   Vitamin D3 25 MCG (1000 UT) Caps Take 2 capsules (2,000 Units total) by mouth daily.        Follow-up: No follow-ups on file.  [name], M.D.

## 2020-07-17 ENCOUNTER — Encounter: Payer: Self-pay | Admitting: Family Medicine

## 2020-07-28 DIAGNOSIS — H353121 Nonexudative age-related macular degeneration, left eye, early dry stage: Secondary | ICD-10-CM | POA: Diagnosis not present

## 2020-07-28 DIAGNOSIS — H18413 Arcus senilis, bilateral: Secondary | ICD-10-CM | POA: Diagnosis not present

## 2020-07-28 DIAGNOSIS — Z961 Presence of intraocular lens: Secondary | ICD-10-CM | POA: Diagnosis not present

## 2020-07-28 DIAGNOSIS — H527 Unspecified disorder of refraction: Secondary | ICD-10-CM | POA: Diagnosis not present

## 2020-07-29 DIAGNOSIS — M5442 Lumbago with sciatica, left side: Secondary | ICD-10-CM | POA: Diagnosis not present

## 2020-07-29 DIAGNOSIS — M47816 Spondylosis without myelopathy or radiculopathy, lumbar region: Secondary | ICD-10-CM | POA: Diagnosis not present

## 2020-07-29 DIAGNOSIS — M9903 Segmental and somatic dysfunction of lumbar region: Secondary | ICD-10-CM | POA: Diagnosis not present

## 2020-08-01 DIAGNOSIS — M9903 Segmental and somatic dysfunction of lumbar region: Secondary | ICD-10-CM | POA: Diagnosis not present

## 2020-08-01 DIAGNOSIS — M5442 Lumbago with sciatica, left side: Secondary | ICD-10-CM | POA: Diagnosis not present

## 2020-08-01 DIAGNOSIS — M47816 Spondylosis without myelopathy or radiculopathy, lumbar region: Secondary | ICD-10-CM | POA: Diagnosis not present

## 2020-08-04 DIAGNOSIS — M9903 Segmental and somatic dysfunction of lumbar region: Secondary | ICD-10-CM | POA: Diagnosis not present

## 2020-08-04 DIAGNOSIS — M47816 Spondylosis without myelopathy or radiculopathy, lumbar region: Secondary | ICD-10-CM | POA: Diagnosis not present

## 2020-08-04 DIAGNOSIS — M5442 Lumbago with sciatica, left side: Secondary | ICD-10-CM | POA: Diagnosis not present

## 2020-08-08 ENCOUNTER — Ambulatory Visit (INDEPENDENT_AMBULATORY_CARE_PROVIDER_SITE_OTHER): Payer: Medicare Other | Admitting: *Deleted

## 2020-08-08 DIAGNOSIS — M159 Polyosteoarthritis, unspecified: Secondary | ICD-10-CM

## 2020-08-08 DIAGNOSIS — I10 Essential (primary) hypertension: Secondary | ICD-10-CM

## 2020-08-08 DIAGNOSIS — M8949 Other hypertrophic osteoarthropathy, multiple sites: Secondary | ICD-10-CM | POA: Diagnosis not present

## 2020-08-08 DIAGNOSIS — M542 Cervicalgia: Secondary | ICD-10-CM

## 2020-08-08 NOTE — Patient Instructions (Signed)
Visit Information  Goals Addressed              This Visit's Progress     Patient Stated   .  "I want this neck pain to get better" (pt-stated)        CARE PLAN ENTRY (see longitudinal plan of care for additional care plan information)  Current Barriers:  . Care Coordination needs related to neck pain in a patient with osteoarthritis (disease states)  Nurse Case Manager Clinical Goal(s):  [name] Over the next 90 days, patient will call PCP with any new or worsening symptoms  Interventions:  . Inter-disciplinary care team collaboration (see longitudinal plan of care) . Chart reviewed including recent office notes and imaging reports . Talked with patient by telephone regarding neck pain/stiffness o Discussed neck injury o Per patient, prednisone did not help neck pain o Has been seeing chiropractor, Dr Bethann Goo, in [place] o Pain is manageable and improving . Encouraged patient to reach out to PCP with any new or worsening symptoms . Previously provided with RN Care Manager contact number and encouraged to reach out as needed . Reviewed upcoming appointments: Dr Livia Snellen 11/30/20  Patient Self Care Activities:  . Performs ADL's independently . Performs IADL's independently . Difficulty driving at present due to neck stiffness  Initial goal documentation       Other   .  Chronic Disease Management Needs   On track     CARE PLAN ENTRY (see longtitudinal plan of care for additional care plan information)  Current Barriers:  . Chronic Disease Management support, education, and care coordination needs related to HTN, hypothyroidism, OA, HLD  Clinical Goal(s) related to HTN, hypothyroidism, OA, HLD:  Over the next 60 days, patient will:  . Work with the care management team to address educational, disease management, and care coordination needs  . Call provider office for new or worsened signs and symptoms  . Call care management team with questions or concerns . Verbalize basic  understanding of patient centered plan of care established today  Interventions related to HTN, hypothyroidism, OA, HLD:  . Evaluation of current treatment plans and patient's adherence to plan as established by provider . Assessed patient understanding of disease states . Assessed patient's education and care coordination needs . Provided disease specific education to patient  . Discussed diet . Discussed mobility and ADLs . Discussed current neck pain . Provided with RNCM telephone number and encouraged to reach out as needed . Reviewed upcoming appts: Dr Livia Snellen 11/30/20 . Scheduled f/u with RNCM for 10/11/20  Patient Self Care Activities related to HTN, hypothyroidism, OA, HLD:  . Patient is able to perform ADLs and IADLs independently . Patient needs assistance with disease management and care coordination    Please see past updates related to this goal by clicking on the "Past Updates" button in the selected goal         Patient verbalizes understanding of instructions provided today.   Follow-up Plan Telephone follow up appointment with care management team member scheduled for: [facility] 10/11/20  Next PCP appointment scheduled for: 11/30/2020  Chong Sicilian, BSN, RN-BC [place] / [place] Management Direct Dial: [phone]

## 2020-08-08 NOTE — Chronic Care Management (AMB) (Signed)
Chronic Care Management   Follow Up Note   08/08/2020 Name: Megan Chapman MRN: [id number] DOB: [birth year]  Referred by: [name], MD Reason for referral : Chronic Care Management (RN follow up)   Megan Chapman is a 86 y.o. year old female who is a primary care patient of Stacks, Cletus Gash, MD. The CCM team was consulted for assistance with chronic disease management and care coordination needs.    Review of patient status, including review of consultants reports, relevant laboratory and other test results, and collaboration with appropriate care team members and the patient's provider was performed as part of comprehensive patient evaluation and provision of chronic care management services.    SDOH (Social Determinants of Health) assessments performed: No See Care Plan activities for detailed interventions related to [facility])     Outpatient Encounter Medications as of 08/08/2020  Medication Sig  . ALPRAZolam (XANAX) 0.5 MG tablet Take 1 tablet (0.5 mg total) by mouth at bedtime as needed. for sleep  . aspirin 81 MG tablet Take 81 mg by mouth daily.  [name] atorvastatin (LIPITOR) 40 MG tablet Take 1 tablet (40 mg total) by mouth daily.  . Cholecalciferol (VITAMIN D3) 1000 UNITS CAPS Take 2 capsules (2,000 Units total) by mouth daily.  [name] levothyroxine (SYNTHROID) 25 MCG tablet TAKE (1) TABLET DAILY BE- FORE BREAKFAST.  [name] Omega-3 Fatty Acids (FISH OIL) 1200 MG CAPS Take 2 capsules by mouth daily.  . pantoprazole (PROTONIX) 40 MG tablet Take 1 tablet (40 mg total) by mouth daily.  . predniSONE (DELTASONE) 10 MG tablet Take 5 daily for 3 days followed by 4,3,2 and 1 for 3 days each.   No facility-administered encounter medications on file as of 08/08/2020.     Objective:   Goals Addressed              This Visit's Progress     Patient Stated   .  "I want this neck pain to get better" (pt-stated)        CARE PLAN ENTRY (see longitudinal plan of care for additional care plan  information)  Current Barriers:  . Care Coordination needs related to neck pain in a patient with osteoarthritis (disease states)  Nurse Case Manager Clinical Goal(s):  [name] Over the next 90 days, patient will call PCP with any new or worsening symptoms  Interventions:  . Inter-disciplinary care team collaboration (see longitudinal plan of care) . Chart reviewed including recent office notes and imaging reports . Talked with patient by telephone regarding neck pain/stiffness o Discussed neck injury o Per patient, prednisone did not help neck pain o Has been seeing chiropractor, Dr Bethann Goo, in [place] o Pain is manageable and improving . Encouraged patient to reach out to PCP with any new or worsening symptoms . Previously provided with RN Care Manager contact number and encouraged to reach out as needed . Reviewed upcoming appointments: Dr Livia Snellen 11/30/20  Patient Self Care Activities:  . Performs ADL's independently . Performs IADL's independently . Difficulty driving at present due to neck stiffness  Initial goal documentation       Other   .  Chronic Disease Management Needs   On track     CARE PLAN ENTRY (see longtitudinal plan of care for additional care plan information)  Current Barriers:  . Chronic Disease Management support, education, and care coordination needs related to HTN, hypothyroidism, OA, HLD  Clinical Goal(s) related to HTN, hypothyroidism, OA, HLD:  Over the next 60 days, patient will:  . Work  with the care management team to address educational, disease management, and care coordination needs  . Call provider office for new or worsened signs and symptoms  . Call care management team with questions or concerns . Verbalize basic understanding of patient centered plan of care established today  Interventions related to HTN, hypothyroidism, OA, HLD:  . Evaluation of current treatment plans and patient's adherence to plan as established by provider . Assessed  patient understanding of disease states . Assessed patient's education and care coordination needs . Provided disease specific education to patient  . Discussed diet . Discussed mobility and ADLs . Discussed current neck pain . Provided with RNCM telephone number and encouraged to reach out as needed . Reviewed upcoming appts: Dr Livia Snellen 11/30/20 . Scheduled f/u with RNCM for 10/11/20  Patient Self Care Activities related to HTN, hypothyroidism, OA, HLD:  . Patient is able to perform ADLs and IADLs independently . Patient needs assistance with disease management and care coordination    Please see past updates related to this goal by clicking on the "Past Updates" button in the selected goal          Plan:   Telephone follow up appointment with care management team member scheduled for: [facility] 10/11/20  Next PCP appointment scheduled for: 11/30/2020   Chong Sicilian, BSN, RN-BC [place] / [place] Management Direct Dial: [phone]

## 2020-08-10 DIAGNOSIS — M9903 Segmental and somatic dysfunction of lumbar region: Secondary | ICD-10-CM | POA: Diagnosis not present

## 2020-08-10 DIAGNOSIS — M5442 Lumbago with sciatica, left side: Secondary | ICD-10-CM | POA: Diagnosis not present

## 2020-08-10 DIAGNOSIS — M47816 Spondylosis without myelopathy or radiculopathy, lumbar region: Secondary | ICD-10-CM | POA: Diagnosis not present

## 2020-08-12 DIAGNOSIS — M9901 Segmental and somatic dysfunction of cervical region: Secondary | ICD-10-CM | POA: Diagnosis not present

## 2020-08-12 DIAGNOSIS — M47812 Spondylosis without myelopathy or radiculopathy, cervical region: Secondary | ICD-10-CM | POA: Diagnosis not present

## 2020-08-12 DIAGNOSIS — M542 Cervicalgia: Secondary | ICD-10-CM | POA: Diagnosis not present

## 2020-08-16 DIAGNOSIS — M9901 Segmental and somatic dysfunction of cervical region: Secondary | ICD-10-CM | POA: Diagnosis not present

## 2020-08-16 DIAGNOSIS — M542 Cervicalgia: Secondary | ICD-10-CM | POA: Diagnosis not present

## 2020-08-16 DIAGNOSIS — M47812 Spondylosis without myelopathy or radiculopathy, cervical region: Secondary | ICD-10-CM | POA: Diagnosis not present

## 2020-08-18 ENCOUNTER — Other Ambulatory Visit: Payer: Self-pay | Admitting: Family Medicine

## 2020-08-18 MED ORDER — ATORVASTATIN CALCIUM 40 MG PO TABS: 40.0000 mg | ORAL_TABLET | Freq: Every day | ORAL | 1 refills | Status: DC

## 2020-08-18 NOTE — Telephone Encounter (Signed)
Left message stating requested rx sent to the requested pharmacy and to call back with any questions or concerns.

## 2020-08-18 NOTE — Telephone Encounter (Signed)
  Prescription Request  08/18/2020  What is the name of the medication or equipment? atorvastatin (LIPITOR) 40 MG tablet    Have you contacted your pharmacy to request a refill? (if applicable) yes  Which pharmacy would you like this sent to? [facility]    Patient notified that their request is being sent to the clinical staff for review and that they should receive a response within 2 business days.

## 2020-08-19 DIAGNOSIS — M47812 Spondylosis without myelopathy or radiculopathy, cervical region: Secondary | ICD-10-CM | POA: Diagnosis not present

## 2020-08-19 DIAGNOSIS — M542 Cervicalgia: Secondary | ICD-10-CM | POA: Diagnosis not present

## 2020-08-19 DIAGNOSIS — M9901 Segmental and somatic dysfunction of cervical region: Secondary | ICD-10-CM | POA: Diagnosis not present

## 2020-08-22 DIAGNOSIS — M542 Cervicalgia: Secondary | ICD-10-CM | POA: Diagnosis not present

## 2020-08-22 DIAGNOSIS — M9901 Segmental and somatic dysfunction of cervical region: Secondary | ICD-10-CM | POA: Diagnosis not present

## 2020-08-22 DIAGNOSIS — M47812 Spondylosis without myelopathy or radiculopathy, cervical region: Secondary | ICD-10-CM | POA: Diagnosis not present

## 2020-08-25 DIAGNOSIS — M47812 Spondylosis without myelopathy or radiculopathy, cervical region: Secondary | ICD-10-CM | POA: Diagnosis not present

## 2020-08-25 DIAGNOSIS — M542 Cervicalgia: Secondary | ICD-10-CM | POA: Diagnosis not present

## 2020-08-25 DIAGNOSIS — M9901 Segmental and somatic dysfunction of cervical region: Secondary | ICD-10-CM | POA: Diagnosis not present

## 2020-08-30 DIAGNOSIS — M542 Cervicalgia: Secondary | ICD-10-CM | POA: Diagnosis not present

## 2020-08-30 DIAGNOSIS — M9901 Segmental and somatic dysfunction of cervical region: Secondary | ICD-10-CM | POA: Diagnosis not present

## 2020-08-30 DIAGNOSIS — M47812 Spondylosis without myelopathy or radiculopathy, cervical region: Secondary | ICD-10-CM | POA: Diagnosis not present

## 2020-09-01 ENCOUNTER — Other Ambulatory Visit: Payer: Self-pay | Admitting: Family Medicine

## 2020-09-01 ENCOUNTER — Telehealth: Payer: Self-pay | Admitting: Family Medicine

## 2020-09-01 MED ORDER — ALPRAZOLAM 0.5 MG PO TABS: 0.5000 mg | ORAL_TABLET | Freq: Every evening | ORAL | 0 refills | Status: DC | PRN

## 2020-09-01 NOTE — Telephone Encounter (Signed)
  Prescription Request  09/01/2020  What is the name of the medication or equipment? Alprazolam 0.5 mg  Have you contacted your pharmacy to request a refill? (if applicable) NO  Which pharmacy would you like this sent to? [facility] is the last one that filled it and needs to go to [facility]   Patient notified that their request is being sent to the clinical staff for review and that they should receive a response within 2 business days.

## 2020-09-01 NOTE — Telephone Encounter (Signed)
Patient seen 06/02/20. Note states to follow up in 6 months. Please review.

## 2020-09-01 NOTE — Telephone Encounter (Signed)
Please let the patient know that I sent their prescription to their pharmacy. Thanks, WS 

## 2020-09-02 DIAGNOSIS — M9901 Segmental and somatic dysfunction of cervical region: Secondary | ICD-10-CM | POA: Diagnosis not present

## 2020-09-02 DIAGNOSIS — M542 Cervicalgia: Secondary | ICD-10-CM | POA: Diagnosis not present

## 2020-09-02 DIAGNOSIS — M47812 Spondylosis without myelopathy or radiculopathy, cervical region: Secondary | ICD-10-CM | POA: Diagnosis not present

## 2020-09-06 DIAGNOSIS — M9901 Segmental and somatic dysfunction of cervical region: Secondary | ICD-10-CM | POA: Diagnosis not present

## 2020-09-06 DIAGNOSIS — M542 Cervicalgia: Secondary | ICD-10-CM | POA: Diagnosis not present

## 2020-09-06 DIAGNOSIS — M47812 Spondylosis without myelopathy or radiculopathy, cervical region: Secondary | ICD-10-CM | POA: Diagnosis not present

## 2020-09-08 DIAGNOSIS — M47812 Spondylosis without myelopathy or radiculopathy, cervical region: Secondary | ICD-10-CM | POA: Diagnosis not present

## 2020-09-08 DIAGNOSIS — M9901 Segmental and somatic dysfunction of cervical region: Secondary | ICD-10-CM | POA: Diagnosis not present

## 2020-09-08 DIAGNOSIS — M542 Cervicalgia: Secondary | ICD-10-CM | POA: Diagnosis not present

## 2020-09-12 ENCOUNTER — Telehealth: Payer: Self-pay | Admitting: Family Medicine

## 2020-09-12 ENCOUNTER — Ambulatory Visit (INDEPENDENT_AMBULATORY_CARE_PROVIDER_SITE_OTHER): Payer: Medicare Other | Admitting: Family Medicine

## 2020-09-12 ENCOUNTER — Encounter: Payer: Self-pay | Admitting: Family Medicine

## 2020-09-12 DIAGNOSIS — R399 Unspecified symptoms and signs involving the genitourinary system: Secondary | ICD-10-CM | POA: Diagnosis not present

## 2020-09-12 MED ORDER — CIPROFLOXACIN HCL 500 MG PO TABS: 500.0000 mg | ORAL_TABLET | Freq: Two times a day (BID) | ORAL | 0 refills | Status: DC

## 2020-09-12 NOTE — Telephone Encounter (Signed)
Appt made

## 2020-09-12 NOTE — Progress Notes (Signed)
Virtual Visit via telephone Note  I connected with Megan Chapman on 09/12/20 at 0844 by telephone and verified that I am speaking with the correct person using two identifiers. Megan Chapman is currently located at home and patient are currently with her during visit. The provider, Fransisca Kaufmann Paisyn Guercio, MD is located in their office at time of visit.  Call ended at [phone]  I discussed the limitations, risks, security and privacy concerns of performing an evaluation and management service by telephone and the availability of in person appointments. I also discussed with the patient that there may be a patient responsible charge related to this service. The patient expressed understanding and agreed to proceed.   History and Present Illness: Patient is having urgency and frequency and started yesterday.  She denies any fevers or chills or flank pain.  She was up every hour last night with abdominal pressure and bladder irritation.  She denies any blood in urine. She denies any vaginal discharge.  She says it is only mild symptoms at this point.   No diagnosis found.  Outpatient Encounter Medications as of 09/12/2020  Medication Sig  . ALPRAZolam (XANAX) 0.5 MG tablet Take 1 tablet (0.5 mg total) by mouth at bedtime as needed. for sleep  . aspirin 81 MG tablet Take 81 mg by mouth daily.  [name] atorvastatin (LIPITOR) 40 MG tablet Take 1 tablet (40 mg total) by mouth daily.  . Cholecalciferol (VITAMIN D3) 1000 UNITS CAPS Take 2 capsules (2,000 Units total) by mouth daily.  [name] levothyroxine (SYNTHROID) 25 MCG tablet TAKE (1) TABLET DAILY BE- FORE BREAKFAST.  [name] Omega-3 Fatty Acids (FISH OIL) 1200 MG CAPS Take 2 capsules by mouth daily.  . pantoprazole (PROTONIX) 40 MG tablet Take 1 tablet (40 mg total) by mouth daily.  . predniSONE (DELTASONE) 10 MG tablet Take 5 daily for 3 days followed by 4,3,2 and 1 for 3 days each.   No facility-administered encounter medications on file as of 09/12/2020.    Review of  Systems  Constitutional: Negative for chills and fever.  Eyes: Negative for visual disturbance.  Respiratory: Negative for chest tightness and shortness of breath.   Cardiovascular: Negative for chest pain and leg swelling.  Gastrointestinal: Negative for abdominal pain.  Genitourinary: Positive for dysuria, frequency and urgency. Negative for difficulty urinating, hematuria, vaginal bleeding, vaginal discharge and vaginal pain.  Musculoskeletal: Negative for back pain and gait problem.  Skin: Negative for rash.  Neurological: Negative for light-headedness and headaches.  Psychiatric/Behavioral: Negative for agitation and behavioral problems.  All other systems reviewed and are negative.   Observations/Objective: Patient sounds comfortable and in no acute distress  Assessment and Plan: Problem List Items Addressed This Visit    None    Visit Diagnoses    UTI symptoms    -  Primary   Relevant Medications   ciprofloxacin (CIPRO) 500 MG tablet      Will treat symptomatically for UTI, has had Cipro before and would like to do that again. Follow up plan: Return if symptoms worsen or fail to improve.     I discussed the assessment and treatment plan with the patient. The patient was provided an opportunity to ask questions and all were answered. The patient agreed with the plan and demonstrated an understanding of the instructions.   The patient was advised to call back or seek an in-person evaluation if the symptoms worsen or if the condition fails to improve as anticipated.  The above assessment and management  plan was discussed with the patient. The patient verbalized understanding of and has agreed to the management plan. Patient is aware to call the clinic if symptoms persist or worsen. Patient is aware when to return to the clinic for a follow-up visit. Patient educated on when it is appropriate to go to the emergency department.    I provided 6 minutes of non-face-to-face  time during this encounter.    Worthy Rancher, MD

## 2020-09-13 DIAGNOSIS — M9901 Segmental and somatic dysfunction of cervical region: Secondary | ICD-10-CM | POA: Diagnosis not present

## 2020-09-13 DIAGNOSIS — M47812 Spondylosis without myelopathy or radiculopathy, cervical region: Secondary | ICD-10-CM | POA: Diagnosis not present

## 2020-09-13 DIAGNOSIS — M542 Cervicalgia: Secondary | ICD-10-CM | POA: Diagnosis not present

## 2020-09-15 DIAGNOSIS — X32XXXD Exposure to sunlight, subsequent encounter: Secondary | ICD-10-CM | POA: Diagnosis not present

## 2020-09-15 DIAGNOSIS — Z85828 Personal history of other malignant neoplasm of skin: Secondary | ICD-10-CM | POA: Diagnosis not present

## 2020-09-15 DIAGNOSIS — Z08 Encounter for follow-up examination after completed treatment for malignant neoplasm: Secondary | ICD-10-CM | POA: Diagnosis not present

## 2020-09-15 DIAGNOSIS — L570 Actinic keratosis: Secondary | ICD-10-CM | POA: Diagnosis not present

## 2020-09-15 DIAGNOSIS — L821 Other seborrheic keratosis: Secondary | ICD-10-CM | POA: Diagnosis not present

## 2020-09-16 DIAGNOSIS — M9901 Segmental and somatic dysfunction of cervical region: Secondary | ICD-10-CM | POA: Diagnosis not present

## 2020-09-16 DIAGNOSIS — M47812 Spondylosis without myelopathy or radiculopathy, cervical region: Secondary | ICD-10-CM | POA: Diagnosis not present

## 2020-09-16 DIAGNOSIS — M542 Cervicalgia: Secondary | ICD-10-CM | POA: Diagnosis not present

## 2020-09-19 DIAGNOSIS — M542 Cervicalgia: Secondary | ICD-10-CM | POA: Diagnosis not present

## 2020-09-19 DIAGNOSIS — M9901 Segmental and somatic dysfunction of cervical region: Secondary | ICD-10-CM | POA: Diagnosis not present

## 2020-09-19 DIAGNOSIS — M47812 Spondylosis without myelopathy or radiculopathy, cervical region: Secondary | ICD-10-CM | POA: Diagnosis not present

## 2020-09-20 ENCOUNTER — Telehealth: Payer: Self-pay

## 2020-09-20 NOTE — Telephone Encounter (Signed)
Pt. Needs to be seen for this. Thanks, WS 

## 2020-09-20 NOTE — Telephone Encounter (Signed)
Patient is currently seeing Dr. Bethann Goo and per patient he was going to see if we could do x-rays on her neck and low back.  Patient states that she is having constant sharps pain in the neck. Patient had cervical x-rays on 07/13/2020.  Patient is also having low back pain that radiates into right hip and down leg. I have not seen an order for any x-rays please review and advise if orders should be placed for x-rays.

## 2020-09-21 DIAGNOSIS — M9901 Segmental and somatic dysfunction of cervical region: Secondary | ICD-10-CM | POA: Diagnosis not present

## 2020-09-21 DIAGNOSIS — M47812 Spondylosis without myelopathy or radiculopathy, cervical region: Secondary | ICD-10-CM | POA: Diagnosis not present

## 2020-09-21 DIAGNOSIS — M542 Cervicalgia: Secondary | ICD-10-CM | POA: Diagnosis not present

## 2020-09-23 DIAGNOSIS — M542 Cervicalgia: Secondary | ICD-10-CM | POA: Diagnosis not present

## 2020-09-23 DIAGNOSIS — M47812 Spondylosis without myelopathy or radiculopathy, cervical region: Secondary | ICD-10-CM | POA: Diagnosis not present

## 2020-09-23 DIAGNOSIS — M9901 Segmental and somatic dysfunction of cervical region: Secondary | ICD-10-CM | POA: Diagnosis not present

## 2020-09-27 DIAGNOSIS — M9901 Segmental and somatic dysfunction of cervical region: Secondary | ICD-10-CM | POA: Diagnosis not present

## 2020-09-27 DIAGNOSIS — M542 Cervicalgia: Secondary | ICD-10-CM | POA: Diagnosis not present

## 2020-09-27 DIAGNOSIS — M47812 Spondylosis without myelopathy or radiculopathy, cervical region: Secondary | ICD-10-CM | POA: Diagnosis not present

## 2020-09-29 ENCOUNTER — Ambulatory Visit: Payer: Medicare Other | Admitting: Family Medicine

## 2020-09-30 DIAGNOSIS — M542 Cervicalgia: Secondary | ICD-10-CM | POA: Diagnosis not present

## 2020-09-30 DIAGNOSIS — M9901 Segmental and somatic dysfunction of cervical region: Secondary | ICD-10-CM | POA: Diagnosis not present

## 2020-09-30 DIAGNOSIS — M47812 Spondylosis without myelopathy or radiculopathy, cervical region: Secondary | ICD-10-CM | POA: Diagnosis not present

## 2020-10-03 DIAGNOSIS — M9901 Segmental and somatic dysfunction of cervical region: Secondary | ICD-10-CM | POA: Diagnosis not present

## 2020-10-03 DIAGNOSIS — M542 Cervicalgia: Secondary | ICD-10-CM | POA: Diagnosis not present

## 2020-10-03 DIAGNOSIS — M47812 Spondylosis without myelopathy or radiculopathy, cervical region: Secondary | ICD-10-CM | POA: Diagnosis not present

## 2020-10-04 ENCOUNTER — Ambulatory Visit (INDEPENDENT_AMBULATORY_CARE_PROVIDER_SITE_OTHER): Payer: Medicare Other

## 2020-10-04 ENCOUNTER — Encounter: Payer: Self-pay | Admitting: Family Medicine

## 2020-10-04 ENCOUNTER — Ambulatory Visit (INDEPENDENT_AMBULATORY_CARE_PROVIDER_SITE_OTHER): Payer: Medicare Other | Admitting: Family Medicine

## 2020-10-04 ENCOUNTER — Other Ambulatory Visit: Payer: Self-pay

## 2020-10-04 VITALS — BP 154/78 | HR 88 | Temp 98.1°F | Resp 20 | Ht 62.0 in | Wt 150.0 lb

## 2020-10-04 DIAGNOSIS — M4722 Other spondylosis with radiculopathy, cervical region: Secondary | ICD-10-CM

## 2020-10-04 DIAGNOSIS — M4306 Spondylolysis, lumbar region: Secondary | ICD-10-CM

## 2020-10-04 DIAGNOSIS — M545 Low back pain, unspecified: Secondary | ICD-10-CM | POA: Diagnosis not present

## 2020-10-04 DIAGNOSIS — M19039 Primary osteoarthritis, unspecified wrist: Secondary | ICD-10-CM | POA: Diagnosis not present

## 2020-10-04 MED ORDER — DICLOFENAC SODIUM 75 MG PO TBEC: 75.0000 mg | DELAYED_RELEASE_TABLET | Freq: Two times a day (BID) | ORAL | 2 refills | Status: DC

## 2020-10-04 MED ORDER — BETAMETHASONE SOD PHOS & ACET 6 (3-3) MG/ML IJ SUSP
6.0000 mg | Freq: Once | INTRAMUSCULAR | Status: AC
  Administered 2020-10-04: 6 mg via INTRAMUSCULAR

## 2020-10-04 NOTE — Progress Notes (Signed)
Subjective:  Patient ID: Megan Chapman, female    DOB: Nov 20, 1931  Age: 86 y.o. MRN: [id number]  CC: No chief complaint on file.   HPI Megan Chapman presents for neck pain that has continued since her last visit and persisted in spite of chiropractic care.  Additionally her low back is now hurting primarily on the right side radiating to the buttocks and thigh.  She has some radiation into the left superior shoulder from the neck pain.  Of note is that she had x-rays showing rather severe arthritis in the cervical spine when she was last here.  Additionally today she is having some pain in the right wrist.  She has been followed as well for blood pressure.  It is not usually as high as it is today.  This seems to be related to the severity of her pain which is quite severe and approximately 8/10.  Depression screen [facility] 2/9 10/04/2020 07/13/2020 07/06/2020  Decreased Interest 0 0 0  Down, Depressed, Hopeless 0 0 1  PHQ - 2 Score 0 0 1  Altered sleeping - - 1  Tired, decreased energy - - 0  Change in appetite - - 0  Feeling bad or failure about yourself  - - 0  Trouble concentrating - - 1  Moving slowly or fidgety/restless - - 0  Suicidal thoughts - - 0  PHQ-9 Score - - 3  Difficult doing work/chores - - Not difficult at all  Some recent data might be hidden    History Megan Chapman has a past medical history of Allergy, Anxiety, Arthritis, Asthma, Basal cell carcinoma, Cataract, GERD (gastroesophageal reflux disease), HTN (hypertension), Hyperlipidemia, Migraine, Palpitations, and Thyroid disease.   She has a past surgical history that includes Partial knee arthroplasty (Left); Abdominal hysterectomy; Meniscus repair (Right); Excision basal cell carcinoma (2017); Cataract extraction (Bilateral); Appendectomy; Tonsillectomy; and Colonoscopy.   Her family history includes COPD in her son; Cancer in her brother, father, and mother; Colon cancer in her mother; Down syndrome in her daughter; Migraines in  her brother and son; Stroke in her brother.She reports that she has never smoked. She has never used smokeless tobacco. She reports that she does not drink alcohol and does not use drugs.    ROS Review of Systems  Constitutional: Positive for activity change and fatigue. Negative for chills and fever.  HENT: Negative.   Respiratory: Negative for shortness of breath.   Cardiovascular: Negative for chest pain.  Gastrointestinal: Negative for abdominal pain.  Musculoskeletal: Positive for arthralgias, back pain, myalgias and neck pain.    Objective:  BP (!) 154/78    Pulse 88    Temp 98.1 F (36.7 C) (Temporal)    Resp 20    Ht 5\' 2"  (1.575 m)    Wt 150 lb (68 kg)    SpO2 95%    BMI 27.44 kg/m   BP Readings from Last 3 Encounters:  10/04/20 (!) 154/78  07/13/20 130/79  05/31/20 137/76    Wt Readings from Last 3 Encounters:  10/04/20 150 lb (68 kg)  07/13/20 149 lb 6 oz (67.8 kg)  05/31/20 150 lb 12.8 oz (68.4 kg)     Physical Exam Constitutional:      General: She is not in acute distress.    Appearance: She is well-developed.  Cardiovascular:     Rate and Rhythm: Normal rate and regular rhythm.  Pulmonary:     Breath sounds: Normal breath sounds.  Musculoskeletal:  General: Tenderness (Throughout the cervical spine region and into the left superior trapezius.  There is also tenderness at the right sciatic notch.  She is tender also at the right anatomic snuffbox) present.  Skin:    General: Skin is warm and dry.  Neurological:     Mental Status: She is alert and oriented to person, place, and time.       Assessment & Plan:   Diagnoses and all orders for this visit:  Osteoarthritis of spine with radiculopathy, cervical region -     betamethasone acetate-betamethasone sodium phosphate (CELESTONE) injection 6 mg -     diclofenac (VOLTAREN) 75 MG EC tablet; Take 1 tablet (75 mg total) by mouth 2 (two) times daily. For muscle and  Joint pain -     MR CERVICAL  SPINE WO CONTRAST; Future  Lumbar spondylolysis -     betamethasone acetate-betamethasone sodium phosphate (CELESTONE) injection 6 mg -     diclofenac (VOLTAREN) 75 MG EC tablet; Take 1 tablet (75 mg total) by mouth 2 (two) times daily. For muscle and  Joint pain -     DG Lumbar Spine 2-3 Views; Future  Arthritis, wrist       I have discontinued Megan Chapman's predniSONE and ciprofloxacin. I am also having her start on diclofenac. Additionally, I am having her maintain her aspirin, Vitamin D3, Fish Oil, pantoprazole, levothyroxine, atorvastatin, and ALPRAZolam. We administered betamethasone acetate-betamethasone sodium phosphate.  Allergies as of 10/04/2020      Reactions   Amoxicillin    Biaxin [clarithromycin]    Ciprofloxacin Nausea Only   Codeine    Nitrofurantoin Nausea And Vomiting, Nausea Only   Sulfa Antibiotics       Medication List       Accurate as of October 04, 2020  9:53 PM. If you have any questions, ask your nurse or doctor.        STOP taking these medications   ciprofloxacin 500 MG tablet Commonly known as: Cipro Stopped by: [name], MD   predniSONE 10 MG tablet Commonly known as: DELTASONE Stopped by: [name], MD     TAKE these medications   ALPRAZolam 0.5 MG tablet Commonly known as: XANAX Take 1 tablet (0.5 mg total) by mouth at bedtime as needed. for sleep   aspirin 81 MG tablet Take 81 mg by mouth daily.   atorvastatin 40 MG tablet Commonly known as: LIPITOR Take 1 tablet (40 mg total) by mouth daily.   diclofenac 75 MG EC tablet Commonly known as: VOLTAREN Take 1 tablet (75 mg total) by mouth 2 (two) times daily. For muscle and  Joint pain Started by: [name], MD   Fish Oil 1200 MG Caps Take 2 capsules by mouth daily.   levothyroxine 25 MCG tablet Commonly known as: SYNTHROID TAKE (1) TABLET DAILY BE- FORE BREAKFAST.   pantoprazole 40 MG tablet Commonly known as: PROTONIX Take 1 tablet (40 mg total) by mouth  daily.   Vitamin D3 25 MCG (1000 UT) Caps Take 2 capsules (2,000 Units total) by mouth daily.        Follow-up: Return in about 6 weeks (around 11/15/2020), or if symptoms worsen or fail to improve.  [name], M.D.

## 2020-10-06 ENCOUNTER — Telehealth: Payer: Self-pay | Admitting: Family Medicine

## 2020-10-06 DIAGNOSIS — M542 Cervicalgia: Secondary | ICD-10-CM | POA: Diagnosis not present

## 2020-10-06 DIAGNOSIS — M47812 Spondylosis without myelopathy or radiculopathy, cervical region: Secondary | ICD-10-CM | POA: Diagnosis not present

## 2020-10-06 DIAGNOSIS — M9901 Segmental and somatic dysfunction of cervical region: Secondary | ICD-10-CM | POA: Diagnosis not present

## 2020-10-06 NOTE — Telephone Encounter (Signed)
Pt returned missed call from [facility] regarding MRI appt. Made pt aware that MRI is scheduled at [facility] on 10/18/20 @ 2PM and she needs to be there by 1:30. Pt voiced understanding.

## 2020-10-11 ENCOUNTER — Telehealth: Payer: Medicare Other | Admitting: *Deleted

## 2020-10-11 ENCOUNTER — Telehealth: Payer: Self-pay | Admitting: *Deleted

## 2020-10-11 DIAGNOSIS — M47812 Spondylosis without myelopathy or radiculopathy, cervical region: Secondary | ICD-10-CM | POA: Diagnosis not present

## 2020-10-11 DIAGNOSIS — M542 Cervicalgia: Secondary | ICD-10-CM | POA: Diagnosis not present

## 2020-10-11 DIAGNOSIS — M9901 Segmental and somatic dysfunction of cervical region: Secondary | ICD-10-CM | POA: Diagnosis not present

## 2020-10-11 NOTE — Telephone Encounter (Signed)
  Chronic Care Management   Outreach Note  10/11/2020 Name: Megan Chapman MRN: [id number] DOB: [birth year]  Referred by: [name], MD Reason for referral : Chronic Care Management (RN follow up)   An unsuccessful telephone follow-up was attempted today. The patient was referred to the case management team for assistance with care management and care coordination.   Follow Up Plan: A HIPAA compliant phone message was left for the patient providing contact information and requesting a return call.  The care management team will reach out to the patient again over the next 30 days.   Chong Sicilian, BSN, RN-BC Embedded Chronic Care Manager [facility] / [place] Management Direct Dial: [phone]

## 2020-10-12 NOTE — Telephone Encounter (Signed)
Pt has been r/s  

## 2020-10-14 DIAGNOSIS — M47812 Spondylosis without myelopathy or radiculopathy, cervical region: Secondary | ICD-10-CM | POA: Diagnosis not present

## 2020-10-14 DIAGNOSIS — M542 Cervicalgia: Secondary | ICD-10-CM | POA: Diagnosis not present

## 2020-10-14 DIAGNOSIS — M9901 Segmental and somatic dysfunction of cervical region: Secondary | ICD-10-CM | POA: Diagnosis not present

## 2020-10-18 ENCOUNTER — Other Ambulatory Visit: Payer: Self-pay

## 2020-10-18 ENCOUNTER — Ambulatory Visit (HOSPITAL_COMMUNITY)
Admission: RE | Admit: 2020-10-18 | Discharge: 2020-10-18 | Disposition: A | Discharge status: Home / Self Care | Payer: Medicare Other | Source: Ambulatory Visit | Attending: Family Medicine | Admitting: Family Medicine

## 2020-10-18 DIAGNOSIS — M50121 Cervical disc disorder at C4-C5 level with radiculopathy: Secondary | ICD-10-CM | POA: Diagnosis not present

## 2020-10-18 DIAGNOSIS — M50123 Cervical disc disorder at C6-C7 level with radiculopathy: Secondary | ICD-10-CM | POA: Diagnosis not present

## 2020-10-18 DIAGNOSIS — M50122 Cervical disc disorder at C5-C6 level with radiculopathy: Secondary | ICD-10-CM | POA: Diagnosis not present

## 2020-10-18 DIAGNOSIS — M4722 Other spondylosis with radiculopathy, cervical region: Secondary | ICD-10-CM | POA: Insufficient documentation

## 2020-10-20 DIAGNOSIS — M9901 Segmental and somatic dysfunction of cervical region: Secondary | ICD-10-CM | POA: Diagnosis not present

## 2020-10-20 DIAGNOSIS — M542 Cervicalgia: Secondary | ICD-10-CM | POA: Diagnosis not present

## 2020-10-20 DIAGNOSIS — M47812 Spondylosis without myelopathy or radiculopathy, cervical region: Secondary | ICD-10-CM | POA: Diagnosis not present

## 2020-10-25 DIAGNOSIS — M9901 Segmental and somatic dysfunction of cervical region: Secondary | ICD-10-CM | POA: Diagnosis not present

## 2020-10-25 DIAGNOSIS — M542 Cervicalgia: Secondary | ICD-10-CM | POA: Diagnosis not present

## 2020-10-25 DIAGNOSIS — M47812 Spondylosis without myelopathy or radiculopathy, cervical region: Secondary | ICD-10-CM | POA: Diagnosis not present

## 2020-10-31 DIAGNOSIS — M542 Cervicalgia: Secondary | ICD-10-CM | POA: Diagnosis not present

## 2020-10-31 DIAGNOSIS — M47812 Spondylosis without myelopathy or radiculopathy, cervical region: Secondary | ICD-10-CM | POA: Diagnosis not present

## 2020-10-31 DIAGNOSIS — M9901 Segmental and somatic dysfunction of cervical region: Secondary | ICD-10-CM | POA: Diagnosis not present

## 2020-11-03 ENCOUNTER — Telehealth: Payer: Medicare Other | Admitting: *Deleted

## 2020-11-04 ENCOUNTER — Other Ambulatory Visit: Payer: Self-pay | Admitting: Family Medicine

## 2020-11-07 DIAGNOSIS — M9901 Segmental and somatic dysfunction of cervical region: Secondary | ICD-10-CM | POA: Diagnosis not present

## 2020-11-07 DIAGNOSIS — M542 Cervicalgia: Secondary | ICD-10-CM | POA: Diagnosis not present

## 2020-11-07 DIAGNOSIS — M47812 Spondylosis without myelopathy or radiculopathy, cervical region: Secondary | ICD-10-CM | POA: Diagnosis not present

## 2020-11-15 DIAGNOSIS — M542 Cervicalgia: Secondary | ICD-10-CM | POA: Diagnosis not present

## 2020-11-15 DIAGNOSIS — M47812 Spondylosis without myelopathy or radiculopathy, cervical region: Secondary | ICD-10-CM | POA: Diagnosis not present

## 2020-11-15 DIAGNOSIS — M9901 Segmental and somatic dysfunction of cervical region: Secondary | ICD-10-CM | POA: Diagnosis not present

## 2020-11-22 DIAGNOSIS — M47812 Spondylosis without myelopathy or radiculopathy, cervical region: Secondary | ICD-10-CM | POA: Diagnosis not present

## 2020-11-22 DIAGNOSIS — M9901 Segmental and somatic dysfunction of cervical region: Secondary | ICD-10-CM | POA: Diagnosis not present

## 2020-11-22 DIAGNOSIS — M542 Cervicalgia: Secondary | ICD-10-CM | POA: Diagnosis not present

## 2020-11-30 ENCOUNTER — Ambulatory Visit: Payer: Medicare Other | Admitting: Family Medicine

## 2020-12-06 ENCOUNTER — Other Ambulatory Visit: Payer: Self-pay

## 2020-12-06 ENCOUNTER — Ambulatory Visit (INDEPENDENT_AMBULATORY_CARE_PROVIDER_SITE_OTHER): Payer: Medicare Other

## 2020-12-06 ENCOUNTER — Encounter: Payer: Self-pay | Admitting: Family Medicine

## 2020-12-06 ENCOUNTER — Ambulatory Visit (INDEPENDENT_AMBULATORY_CARE_PROVIDER_SITE_OTHER): Payer: Medicare Other | Admitting: Family Medicine

## 2020-12-06 VITALS — BP 158/76 | Temp 97.8°F | Resp 20 | Ht 62.0 in | Wt 149.2 lb

## 2020-12-06 DIAGNOSIS — R0600 Dyspnea, unspecified: Secondary | ICD-10-CM

## 2020-12-06 DIAGNOSIS — M4306 Spondylolysis, lumbar region: Secondary | ICD-10-CM | POA: Diagnosis not present

## 2020-12-06 DIAGNOSIS — E038 Other specified hypothyroidism: Secondary | ICD-10-CM

## 2020-12-06 DIAGNOSIS — E782 Mixed hyperlipidemia: Secondary | ICD-10-CM

## 2020-12-06 DIAGNOSIS — M19031 Primary osteoarthritis, right wrist: Secondary | ICD-10-CM | POA: Diagnosis not present

## 2020-12-06 DIAGNOSIS — R0609 Other forms of dyspnea: Secondary | ICD-10-CM

## 2020-12-06 DIAGNOSIS — G8929 Other chronic pain: Secondary | ICD-10-CM

## 2020-12-06 DIAGNOSIS — M25531 Pain in right wrist: Secondary | ICD-10-CM | POA: Diagnosis not present

## 2020-12-06 DIAGNOSIS — Z23 Encounter for immunization: Secondary | ICD-10-CM | POA: Diagnosis not present

## 2020-12-06 DIAGNOSIS — M4722 Other spondylosis with radiculopathy, cervical region: Secondary | ICD-10-CM | POA: Diagnosis not present

## 2020-12-06 DIAGNOSIS — M9901 Segmental and somatic dysfunction of cervical region: Secondary | ICD-10-CM | POA: Diagnosis not present

## 2020-12-06 DIAGNOSIS — E559 Vitamin D deficiency, unspecified: Secondary | ICD-10-CM | POA: Diagnosis not present

## 2020-12-06 DIAGNOSIS — M47812 Spondylosis without myelopathy or radiculopathy, cervical region: Secondary | ICD-10-CM | POA: Diagnosis not present

## 2020-12-06 DIAGNOSIS — M542 Cervicalgia: Secondary | ICD-10-CM | POA: Diagnosis not present

## 2020-12-06 MED ORDER — ALPRAZOLAM 0.5 MG PO TABS
0.5000 mg | ORAL_TABLET | Freq: Every evening | ORAL | 1 refills | Status: DC | PRN
Start: 2020-12-06 — End: 2021-06-06

## 2020-12-06 MED ORDER — DICLOFENAC SODIUM 75 MG PO TBEC: 75.0000 mg | DELAYED_RELEASE_TABLET | Freq: Two times a day (BID) | ORAL | 2 refills | Status: DC

## 2020-12-06 NOTE — Progress Notes (Signed)
 Subjective:  Patient ID: Megan Chapman, female    DOB: 05/11/1931  Age: 86 y.o. MRN: [id number]  CC: Medical Management of Chronic Issues   HPI Megan Chapman presents for bilateral wrist pain. It limits her use of them. It is chronic, but increasing recently . She points to the radial carpal junction for several months. She also has pain in the lumbar spine.    follow-up on  thyroid. The patient has a history of hypothyroidism for many years. It has been stable recently. Pt. denies any change in  voice, loss of hair, heat or cold intolerance. Energy level has been adequate to good. Patient denies constipation and diarrhea. No myxedema. Medication is as noted below. Verified that pt is taking it daily on an empty stomach. Well tolerated.  Depression screen PHQ 2/9 12/06/2020 10/04/2020 07/13/2020  Decreased Interest 0 0 0  Down, Depressed, Hopeless 0 0 0  PHQ - 2 Score 0 0 0  Altered sleeping - - -  Tired, decreased energy - - -  Change in appetite - - -  Feeling bad or failure about yourself  - - -  Trouble concentrating - - -  Moving slowly or fidgety/restless - - -  Suicidal thoughts - - -  PHQ-9 Score - - -  Difficult doing work/chores - - -  Some recent data might be hidden    History Megan Chapman has a past medical history of Allergy, Anxiety, Arthritis, Asthma, Basal cell carcinoma, Cataract, GERD (gastroesophageal reflux disease), HTN (hypertension), Hyperlipidemia, Migraine, Palpitations, and Thyroid disease.   She has a past surgical history that includes Partial knee arthroplasty (Left); Abdominal hysterectomy; Meniscus repair (Right); Excision basal cell carcinoma (2017); Cataract extraction (Bilateral); Appendectomy; Tonsillectomy; and Colonoscopy.   Her family history includes COPD in her son; Cancer in her brother, father, and mother; Colon cancer in her mother; Down syndrome in her daughter; Migraines in her brother and son; Stroke in her brother.She reports that she has never  smoked. She has never used smokeless tobacco. She reports that she does not drink alcohol and does not use drugs.    ROS Review of Systems  Constitutional: Negative.   HENT: Negative.   Eyes: Negative for visual disturbance.  Respiratory: Negative for shortness of breath.   Cardiovascular: Negative for chest pain.  Gastrointestinal: Negative for abdominal pain.  Musculoskeletal: Negative for arthralgias.    Objective:  BP (!) 158/76   Temp 97.8 F (36.6 C) (Temporal)   Resp 20   Ht 5' 2" (1.575 m)   Wt 149 lb 4 oz (67.7 kg)   SpO2 96%   BMI 27.30 kg/m   BP Readings from Last 3 Encounters:  12/06/20 (!) 158/76  10/04/20 (!) 154/78  07/13/20 130/79    Wt Readings from Last 3 Encounters:  12/06/20 149 lb 4 oz (67.7 kg)  10/04/20 150 lb (68 kg)  07/13/20 149 lb 6 oz (67.8 kg)     Physical Exam Constitutional:      General: She is not in acute distress.    Appearance: She is well-developed and well-nourished.  HENT:     Head: Normocephalic and atraumatic.  Eyes:     Conjunctiva/sclera: Conjunctivae normal.     Pupils: Pupils are equal, round, and reactive to light.  Neck:     Thyroid: No thyromegaly.  Cardiovascular:     Rate and Rhythm: Normal rate and regular rhythm.     Heart sounds: Normal heart sounds. No murmur heard.   Pulmonary:       Effort: Pulmonary effort is normal. No respiratory distress.     Breath sounds: Normal breath sounds. No wheezing or rales.  Abdominal:     General: Bowel sounds are normal. There is no distension.     Palpations: Abdomen is soft.     Tenderness: There is no abdominal tenderness.  Musculoskeletal:        General: Tenderness (at right radial head.) present. Normal range of motion.     Cervical back: Normal range of motion and neck supple.  Lymphadenopathy:     Cervical: No cervical adenopathy.  Skin:    General: Skin is warm and dry.  Neurological:     Mental Status: She is alert and oriented to person, place, and  time.  Psychiatric:        Mood and Affect: Mood and affect normal.        Behavior: Behavior normal.        Thought Content: Thought content normal.        Judgment: Judgment normal.       Assessment & Plan:   Megan Chapman was seen today for medical management of chronic issues.  Diagnoses and all orders for this visit:  Other specified hypothyroidism -     CBC with Differential/Platelet -     CMP14+EGFR -     TSH + free T4  Osteoarthritis of spine with radiculopathy, cervical region -     diclofenac (VOLTAREN) 75 MG EC tablet; Take 1 tablet (75 mg total) by mouth 2 (two) times daily. For muscle and  Joint pain -     CBC with Differential/Platelet -     CMP14+EGFR  Lumbar spondylolysis -     diclofenac (VOLTAREN) 75 MG EC tablet; Take 1 tablet (75 mg total) by mouth 2 (two) times daily. For muscle and  Joint pain -     CBC with Differential/Platelet -     CMP14+EGFR  Need for immunization against influenza -     Flu Vaccine QUAD High Dose(Fluad)  DOE (dyspnea on exertion) -     DG Chest 2 View; Future -     CBC with Differential/Platelet -     CMP14+EGFR  Wrist pain, chronic, right -     DG Wrist Complete Right; Future -     CBC with Differential/Platelet -     CMP14+EGFR  Mixed hyperlipidemia -     CBC with Differential/Platelet -     CMP14+EGFR -     Lipid panel  Vitamin D deficiency -     CBC with Differential/Platelet -     CMP14+EGFR  Other orders -     ALPRAZolam (XANAX) 0.5 MG tablet; Take 1 tablet (0.5 mg total) by mouth at bedtime as needed. for sleep       I am having Megan Chapman maintain her aspirin, Vitamin D3, Fish Oil, pantoprazole, atorvastatin, levothyroxine, diclofenac, and ALPRAZolam.  Allergies as of 12/06/2020      Reactions   Amoxicillin    Biaxin [clarithromycin]    Ciprofloxacin Nausea Only   Codeine    Nitrofurantoin Nausea And Vomiting, Nausea Only   Sulfa Antibiotics       Medication List       Accurate as of December 06, 2020 11:59 PM. If you have any questions, ask your nurse or doctor.        ALPRAZolam 0.5 MG tablet Commonly known as: XANAX Take 1 tablet (0.5 mg total) by mouth at bedtime as needed. for sleep     aspirin 81 MG tablet Take 81 mg by mouth daily.   atorvastatin 40 MG tablet Commonly known as: LIPITOR Take 1 tablet (40 mg total) by mouth daily.   diclofenac 75 MG EC tablet Commonly known as: VOLTAREN Take 1 tablet (75 mg total) by mouth 2 (two) times daily. For muscle and  Joint pain   Fish Oil 1200 MG Caps Take 2 capsules by mouth daily.   levothyroxine 25 MCG tablet Commonly known as: SYNTHROID TAKE 1 TABLET DAILY BEFORE BREAKFAST   pantoprazole 40 MG tablet Commonly known as: PROTONIX Take 1 tablet (40 mg total) by mouth daily.   Vitamin D3 25 MCG (1000 UT) Caps Take 2 capsules (2,000 Units total) by mouth daily.        Follow-up: Return in about 6 months (around 06/06/2021), or if symptoms worsen or fail to improve.  [name], M.D.

## 2020-12-07 LAB — LIPID PANEL
Chol/HDL Ratio: 3.2 ratio (ref 0.0–4.4)
Cholesterol, Total: 194 mg/dL (ref 100–199)
HDL: 60 mg/dL (ref >39)
LDL Chol Calc (NIH): 113 mg/dL — ABNORMAL HIGH (ref 0–99)
Triglycerides: 119 mg/dL (ref 0–149)
VLDL Cholesterol Cal: 21 mg/dL (ref 5–40)

## 2020-12-07 LAB — CMP14+EGFR
ALT: 12 IU/L (ref 0–32)
AST: 14 IU/L (ref 0–40)
Albumin/Globulin Ratio: 1.7 (ref 1.2–2.2)
Albumin: 4.5 g/dL (ref 3.6–4.6)
Alkaline Phosphatase: 64 IU/L (ref 44–121)
BUN/Creatinine Ratio: 17 (ref 12–28)
BUN: 16 mg/dL (ref 8–27)
Bilirubin Total: 0.5 mg/dL (ref 0.0–1.2)
CO2: 26 mmol/L (ref 20–29)
Calcium: 9.7 mg/dL (ref 8.7–10.3)
Chloride: 102 mmol/L (ref 96–106)
Creatinine, Ser: 0.95 mg/dL (ref 0.57–1.00)
GFR calc Af Amer: 62 mL/min/{1.73_m2} (ref >59)
GFR calc non Af Amer: 54 mL/min/{1.73_m2} — ABNORMAL LOW (ref >59)
Globulin, Total: 2.6 g/dL (ref 1.5–4.5)
Glucose: 89 mg/dL (ref 65–99)
Potassium: 4.3 mmol/L (ref 3.5–5.2)
Sodium: 140 mmol/L (ref 134–144)
Total Protein: 7.1 g/dL (ref 6.0–8.5)

## 2020-12-07 LAB — CBC WITH DIFFERENTIAL/PLATELET
Basophils Absolute: 0 10*3/uL (ref 0.0–0.2)
Basos: 0 %
EOS (ABSOLUTE): 0.1 10*3/uL (ref 0.0–0.4)
Eos: 2 %
Hematocrit: 33.2 % — ABNORMAL LOW (ref 34.0–46.6)
Hemoglobin: 11.1 g/dL (ref 11.1–15.9)
Immature Grans (Abs): 0 10*3/uL (ref 0.0–0.1)
Immature Granulocytes: 0 %
Lymphocytes Absolute: 1.5 10*3/uL (ref 0.7–3.1)
Lymphs: 43 %
MCH: 30.2 pg (ref 26.6–33.0)
MCHC: 33.4 g/dL (ref 31.5–35.7)
MCV: 90 fL (ref 79–97)
Monocytes Absolute: 0.5 10*3/uL (ref 0.1–0.9)
Monocytes: 15 %
Neutrophils Absolute: 1.4 10*3/uL (ref 1.4–7.0)
Neutrophils: 40 %
Platelets: 227 10*3/uL (ref 150–450)
RBC: 3.68 x10E6/uL — ABNORMAL LOW (ref 3.77–5.28)
RDW: 13.2 % (ref 11.7–15.4)
WBC: 3.4 10*3/uL (ref 3.4–10.8)

## 2020-12-07 LAB — TSH+FREE T4
Free T4: 1.11 ng/dL (ref 0.82–1.77)
TSH: 2.07 u[IU]/mL (ref 0.450–4.500)

## 2020-12-08 ENCOUNTER — Telehealth: Payer: Medicare Other

## 2020-12-09 ENCOUNTER — Encounter: Payer: Self-pay | Admitting: Family Medicine

## 2020-12-14 ENCOUNTER — Telehealth: Payer: Self-pay

## 2020-12-14 NOTE — Telephone Encounter (Signed)
Diclofenac, as prescribed is the best medication for this

## 2020-12-14 NOTE — Telephone Encounter (Signed)
Patient aware.

## 2020-12-14 NOTE — Telephone Encounter (Signed)
Patient notified of xray results. She wants to know if there is something she can do? She doesn't mind taking medications.. What do you suggest?

## 2020-12-14 NOTE — Telephone Encounter (Signed)
Please call pt with xray results.

## 2020-12-20 DIAGNOSIS — M47812 Spondylosis without myelopathy or radiculopathy, cervical region: Secondary | ICD-10-CM | POA: Diagnosis not present

## 2020-12-20 DIAGNOSIS — M542 Cervicalgia: Secondary | ICD-10-CM | POA: Diagnosis not present

## 2020-12-20 DIAGNOSIS — M9901 Segmental and somatic dysfunction of cervical region: Secondary | ICD-10-CM | POA: Diagnosis not present

## 2020-12-27 ENCOUNTER — Ambulatory Visit: Payer: Medicare Other | Admitting: *Deleted

## 2020-12-27 DIAGNOSIS — I10 Essential (primary) hypertension: Secondary | ICD-10-CM

## 2020-12-27 DIAGNOSIS — M159 Polyosteoarthritis, unspecified: Secondary | ICD-10-CM

## 2020-12-27 NOTE — Patient Instructions (Signed)
Visit Information  Goals Addressed            This Visit's Progress   . Manage Pain-Osteoarthritis       Timeframe:  Long-Range Goal Priority:  High Start Date:                             Expected End Date:                       Follow Up Date 02/07/2021    - call for medicine refill 2 or 3 days before it runs out - plan exercise or activity when pain is best controlled - track what makes the pain worse and what makes it better - use ice or heat for pain relief  - keep all medical appointments - call PCP with any new or worsening symptoms   Why is this important?    Day-to-day life can be hard when you have joint pain.   Pain medicine is just one piece of the treatment puzzle. There are many things you can do to manage pain and stay strong.    Lifestyle changes like stopping smoking and eating foods with Vitamin D and calcium keeps your bones and muscles healthy. Your joints are better when supported by strong muscles.   You can try these action steps to help you manage your pain.     Notes:     . Track and Manage My Blood Pressure-Hypertension       Timeframe:  Long-Range Goal Priority:  Medium Start Date:                             Expected End Date:                       Follow Up Date 02/07/2021    - check blood pressure 3 times per week - write blood pressure results in a log or diary  - bring log to PCP appointment - call PCP with any readings outside of recommended range - take medications as prescribed   Why is this important?    You won't feel high blood pressure, but it can still hurt your blood vessels.   High blood pressure can cause heart or kidney problems. It can also cause a stroke.   Making lifestyle changes like losing a little weight or eating less salt will help.   Checking your blood pressure at home and at different times of the day can help to control blood pressure.   If the doctor prescribes medicine remember to take it the way the  doctor ordered.   Call the office if you cannot afford the medicine or if there are questions about it.     Notes:        The patient verbalized understanding of instructions, educational materials, and care plan provided today and declined offer to receive copy of patient instructions, educational materials, and care plan.    Plan:   Telephone follow up appointment with care management team member scheduled for: 02/07/2021 with RN Care Manager  The patient has been provided with contact information for the care management team and has been advised to call with any health related questions or concerns.   Next PCP appointment scheduled for: 06/06/2021 with Dr Darlyn Read  Demetrios Loll, BSN, RN-BC Embedded Chronic Care Manager [facility] [place] [facility] Direct Dial: [phone]

## 2020-12-27 NOTE — Chronic Care Management (AMB) (Signed)
Chronic Care Management   Follow Up Note   12/27/2020 Name: Megan Chapman MRN: [id number] DOB: Feb 10, 1931  Referred by: Megan Claude, MD Reason for referral : Chronic Care Management (RN follow up)   Megan Chapman is a 86 y.o. year old female who is a primary care patient of Stacks, Broadus John, MD. The CCM team was consulted for assistance with chronic disease management and care coordination needs.    Review of patient status, including review of consultants reports, relevant laboratory and other test results, and collaboration with appropriate care team members and the patient's provider was performed as part of comprehensive patient evaluation and provision of chronic care management services.    SDOH (Social Determinants of Health) assessments performed: Yes See Care Plan activities for detailed interventions related to [facility])     Outpatient Encounter Medications as of 12/27/2020  Medication Sig  . ALPRAZolam (XANAX) 0.5 MG tablet Take 1 tablet (0.5 mg total) by mouth at bedtime as needed. for sleep  . aspirin 81 MG tablet Take 81 mg by mouth daily.  [name] atorvastatin (LIPITOR) 40 MG tablet Take 1 tablet (40 mg total) by mouth daily.  . Cholecalciferol (VITAMIN D3) 1000 UNITS CAPS Take 2 capsules (2,000 Units total) by mouth daily.  . diclofenac (VOLTAREN) 75 MG EC tablet Take 1 tablet (75 mg total) by mouth 2 (two) times daily. For muscle and  Joint pain  . levothyroxine (SYNTHROID) 25 MCG tablet TAKE 1 TABLET DAILY BEFORE BREAKFAST  . Omega-3 Fatty Acids (FISH OIL) 1200 MG CAPS Take 2 capsules by mouth daily.  . pantoprazole (PROTONIX) 40 MG tablet Take 1 tablet (40 mg total) by mouth daily.   No facility-administered encounter medications on file as of 12/27/2020.     Objective:  BP Readings from Last 3 Encounters:  12/06/20 (!) 158/76  10/04/20 (!) 154/78  07/13/20 130/79       Patient Care Plan: Hypertension (Adult)    Problem Identified: Hypertension (Hypertension)    Priority: Medium    Long-Range Goal: Hypertension Monitored   This Visit's Progress: Not on track  Priority: Medium  Note:   Current Barriers:  . Chronic Disease Management support and education needs related to hypertension . Chronic pain  Nurse Case Manager Clinical Goal(s):  [name] Over the next 60 days, patient will verbalize understanding of plan for hypertension management . Over the next 60 days, patient will work with Medical illustrator to address needs related to self-management of hypertension  Interventions:  . 1:1 collaboration with Megan Claude, MD regarding development and update of comprehensive plan of care as evidenced by provider attestation and co-signature . Inter-disciplinary care team collaboration (see longitudinal plan of care) . Evaluation of current treatment plan related to hypertension and patient's adherence to plan as established by provider. . Chart reviewed including relevant office notes and lab results . Medications reviewed and discussed with patient . Encouraged patient to take medications as prescribed . Advised to check and record blood pressure 3 times a week and PRN . Reviewed upcoming appointments: Dr Megan Chapman 05/2021 . Encouraged patient to reach out to PCP with any blood pressure readings outside of recommended range . Encouraged patient to reach out to RN Care Manager as needed  Patient Goals/Self-Care Activities Over the next 60 days, patient will:  - check blood pressure 3 times per week - write blood pressure results in a log or diary  - bring log to PCP appointment - call PCP with any readings outside of recommended range -  take medications as prescribed   Follow Up Plan:  . Telephone follow up appointment with care management team member scheduled for: 02/07/2021 with RN Care Manager . The patient has been provided with contact information for the care management team and has been advised to call with any health related questions or  concerns.        Patient Care Plan: Osteoarthritis (Adult)    Problem Identified: Pain (Osteoarthritis)   Priority: High    Long-Range Goal: Manage Pain   This Visit's Progress: On track  Priority: High  Note:   Current Barriers:  . Chronic Disease Management support and education needs related to arthritis and chronic pain associated with arthritis  Nurse Case Manager Clinical Goal(s):  [name] Over the next 60 days, patient will verbalize understanding of plan for osteoarthritis pain management . Over the next 60 days, patient will work with Medical illustrator to address needs related to self-management of osteoarhritis pain management  Interventions:  . 1:1 collaboration with Megan Claude, MD regarding development and update of comprehensive plan of care as evidenced by provider attestation and co-signature . Inter-disciplinary care team collaboration (see longitudinal plan of care) . Evaluation of current treatment plan related to osteoarthritis and patient's adherence to plan as established by provider. . Chart reviewed including relevant office notes, lab results, and imaging reports . Discussed imaging reports and dx of arthritis . Reviewed and discussed medications o Prescribed voltaren for pain. She wasn't able to tolerate this in the past but is tolerating well now. o Taking with food o Discussed that voltaren is an NSAID and can potentially affect kidney function over time, cause GI problems, and increase blood pressure o Discussed that voltaren is helping control pain at this time o Encouraged to report any negative side effects to PCP . Discussed chiropractor visits o Seeing Dr Megan Chapman in [place]  o Going 2-3 times a month with a plan of decreasing to once a month for maintenance  . Discussed effect of pain on mobility and daily activities . Encouraged patient to reach out to PCP with any new or worsening symptoms . Encouraged patient to reach out to RN Care Manager as  needed  Patient Goals/Self-Care Activities Over the next 60 days, patient will: - call for medicine refill 2 or 3 days before it runs out - plan exercise or activity when pain is best controlled - track what makes the pain worse and what makes it better - use ice or heat for pain relief  - keep all medical appointments - call PCP with any new or worsening symptoms   Follow Up Plan:  . Telephone follow up appointment with care management team member scheduled for: 02/07/2021 with RN Care Manager . The patient has been provided with contact information for the care management team and has been advised to call with any health related questions or concerns.          Plan:   Telephone follow up appointment with care management team member scheduled for: 02/07/2021 with RN Care Manager  The patient has been provided with contact information for the care management team and has been advised to call with any health related questions or concerns.   Next PCP appointment scheduled for: 06/06/2021 with Dr Megan Chapman  Demetrios Loll, BSN, RN-BC Embedded Chronic Care Manager [facility] / [facility] Care Management Direct Dial: [phone]

## 2021-01-05 DIAGNOSIS — M47812 Spondylosis without myelopathy or radiculopathy, cervical region: Secondary | ICD-10-CM | POA: Diagnosis not present

## 2021-01-05 DIAGNOSIS — M9901 Segmental and somatic dysfunction of cervical region: Secondary | ICD-10-CM | POA: Diagnosis not present

## 2021-01-05 DIAGNOSIS — M542 Cervicalgia: Secondary | ICD-10-CM | POA: Diagnosis not present

## 2021-01-25 DIAGNOSIS — M542 Cervicalgia: Secondary | ICD-10-CM | POA: Diagnosis not present

## 2021-01-25 DIAGNOSIS — M47812 Spondylosis without myelopathy or radiculopathy, cervical region: Secondary | ICD-10-CM | POA: Diagnosis not present

## 2021-01-25 DIAGNOSIS — M9901 Segmental and somatic dysfunction of cervical region: Secondary | ICD-10-CM | POA: Diagnosis not present

## 2021-01-26 DIAGNOSIS — L570 Actinic keratosis: Secondary | ICD-10-CM | POA: Diagnosis not present

## 2021-01-26 DIAGNOSIS — L820 Inflamed seborrheic keratosis: Secondary | ICD-10-CM | POA: Diagnosis not present

## 2021-01-26 DIAGNOSIS — X32XXXD Exposure to sunlight, subsequent encounter: Secondary | ICD-10-CM | POA: Diagnosis not present

## 2021-01-26 DIAGNOSIS — Z08 Encounter for follow-up examination after completed treatment for malignant neoplasm: Secondary | ICD-10-CM | POA: Diagnosis not present

## 2021-01-26 DIAGNOSIS — Z85828 Personal history of other malignant neoplasm of skin: Secondary | ICD-10-CM | POA: Diagnosis not present

## 2021-02-07 ENCOUNTER — Ambulatory Visit: Payer: Medicare Other | Admitting: *Deleted

## 2021-02-07 DIAGNOSIS — M159 Polyosteoarthritis, unspecified: Secondary | ICD-10-CM

## 2021-02-07 DIAGNOSIS — I10 Essential (primary) hypertension: Secondary | ICD-10-CM

## 2021-02-07 DIAGNOSIS — M150 Primary generalized (osteo)arthritis: Secondary | ICD-10-CM

## 2021-02-07 DIAGNOSIS — M8949 Other hypertrophic osteoarthropathy, multiple sites: Secondary | ICD-10-CM

## 2021-02-07 NOTE — Patient Instructions (Signed)
Follow-up: Appointment rescheduled for 02/13/21  Chong Sicilian, BSN, RN-BC [place] [facility] [place] Management Direct Dial: [phone]

## 2021-02-07 NOTE — Chronic Care Management (AMB) (Signed)
   02/07/2021  Daleysa Hach 09-Mar-1931 [id number]  Patient was unable to complete RN Care Manager follow-up telephone call today and requested that we reschedule for a later time.   Follow-up: Appointment rescheduled for 02/13/21  Chong Sicilian, BSN, RN-BC [place] [facility] [place] [facility] Direct Dial: [phone]

## 2021-02-13 ENCOUNTER — Ambulatory Visit (INDEPENDENT_AMBULATORY_CARE_PROVIDER_SITE_OTHER): Payer: Medicare Other | Admitting: *Deleted

## 2021-02-13 DIAGNOSIS — R1010 Upper abdominal pain, unspecified: Secondary | ICD-10-CM

## 2021-02-13 DIAGNOSIS — M8949 Other hypertrophic osteoarthropathy, multiple sites: Secondary | ICD-10-CM

## 2021-02-13 DIAGNOSIS — I10 Essential (primary) hypertension: Secondary | ICD-10-CM

## 2021-02-13 DIAGNOSIS — M159 Polyosteoarthritis, unspecified: Secondary | ICD-10-CM

## 2021-02-14 ENCOUNTER — Encounter: Payer: Self-pay | Admitting: Family Medicine

## 2021-02-14 ENCOUNTER — Ambulatory Visit (INDEPENDENT_AMBULATORY_CARE_PROVIDER_SITE_OTHER): Payer: Medicare Other | Admitting: Family Medicine

## 2021-02-14 ENCOUNTER — Other Ambulatory Visit: Payer: Self-pay

## 2021-02-14 VITALS — BP 133/72 | HR 92 | Temp 97.5°F | Resp 20 | Ht 62.0 in | Wt 144.2 lb

## 2021-02-14 DIAGNOSIS — K253 Acute gastric ulcer without hemorrhage or perforation: Secondary | ICD-10-CM

## 2021-02-14 DIAGNOSIS — M4306 Spondylolysis, lumbar region: Secondary | ICD-10-CM | POA: Diagnosis not present

## 2021-02-14 DIAGNOSIS — M4722 Other spondylosis with radiculopathy, cervical region: Secondary | ICD-10-CM

## 2021-02-14 MED ORDER — DICLOFENAC SODIUM 75 MG PO TBEC: 75.0000 mg | DELAYED_RELEASE_TABLET | Freq: Every day | ORAL | 2 refills | Status: DC

## 2021-02-14 MED ORDER — ATORVASTATIN CALCIUM 40 MG PO TABS
40.0000 mg | ORAL_TABLET | Freq: Every day | ORAL | 1 refills | Status: DC
Start: 2021-02-14 — End: 2021-08-03

## 2021-02-14 MED ORDER — PANTOPRAZOLE SODIUM 40 MG PO TBEC
40.0000 mg | DELAYED_RELEASE_TABLET | Freq: Two times a day (BID) | ORAL | 3 refills | Status: DC
Start: 2021-02-14 — End: 2021-03-01

## 2021-02-14 NOTE — Progress Notes (Signed)
Subjective:  Patient ID: Megan Chapman, female    DOB: 07-19-1931  Age: 87 y.o. MRN: [id number]  CC: No chief complaint on file.   HPI Megan Chapman presents for four weeks of gnawing at epigastrum intermittent. Last night was the worst. Up at midnight for pain and too aspirin. Gone when she awoke at 5:30 this morning.  It radiated to the left costal margin. At first there was constipation, Then diarrhea. Thesinge resolved. Pain continued at epigastrum and radiating.  She does have radiation to the central back.  Depression screen [facility] 2/9 02/14/2021 12/06/2020 10/04/2020  Decreased Interest 0 0 0  Down, Depressed, Hopeless 0 0 0  PHQ - 2 Score 0 0 0  Altered sleeping - - -  Tired, decreased energy - - -  Change in appetite - - -  Feeling bad or failure about yourself  - - -  Trouble concentrating - - -  Moving slowly or fidgety/restless - - -  Suicidal thoughts - - -  PHQ-9 Score - - -  Difficult doing work/chores - - -  Some recent data might be hidden    History Megan Chapman has a past medical history of Allergy, Anxiety, Arthritis, Asthma, Basal cell carcinoma, Cataract, GERD (gastroesophageal reflux disease), HTN (hypertension), Hyperlipidemia, Migraine, Palpitations, and Thyroid disease.   She has a past surgical history that includes Partial knee arthroplasty (Left); Abdominal hysterectomy; Meniscus repair (Right); Excision basal cell carcinoma (2017); Cataract extraction (Bilateral); Appendectomy; Tonsillectomy; and Colonoscopy.   Her family history includes COPD in her son; Cancer in her brother, father, and mother; Colon cancer in her mother; Down syndrome in her daughter; Migraines in her brother and son; Stroke in her brother.She reports that she has never smoked. She has never used smokeless tobacco. She reports that she does not drink alcohol and does not use drugs.    ROS Review of Systems  Constitutional: Negative.   HENT: Negative.   Eyes: Negative for visual disturbance.   Respiratory: Negative for shortness of breath.   Cardiovascular: Negative for chest pain.  Gastrointestinal: Positive for abdominal distention, abdominal pain, constipation, diarrhea and nausea. Negative for blood in stool and rectal pain.  Musculoskeletal: Negative for arthralgias.    Objective:  BP 133/72   Pulse 92   Temp (!) 97.5 F (36.4 C) (Temporal)   Resp 20   Ht '5\' 2"'  (1.575 m)   Wt 144 lb 4 oz (65.4 kg)   SpO2 96%   BMI 26.38 kg/m   BP Readings from Last 3 Encounters:  02/14/21 133/72  12/06/20 (!) 158/76  10/04/20 (!) 154/78    Wt Readings from Last 3 Encounters:  02/14/21 144 lb 4 oz (65.4 kg)  12/06/20 149 lb 4 oz (67.7 kg)  10/04/20 150 lb (68 kg)     Physical Exam Constitutional:      Appearance: She is well-developed and well-nourished.  HENT:     Head: Normocephalic and atraumatic.  Cardiovascular:     Rate and Rhythm: Normal rate and regular rhythm.     Heart sounds: No murmur heard.   Pulmonary:     Effort: Pulmonary effort is normal.     Breath sounds: Normal breath sounds.  Abdominal:     General: Bowel sounds are normal.     Palpations: Abdomen is soft. There is no mass.     Tenderness: There is abdominal tenderness (moderate at epigastrum). There is no guarding or rebound.  Skin:    General: Skin is warm and  dry.  Neurological:     Mental Status: She is alert and oriented to person, place, and time.  Psychiatric:        Mood and Affect: Mood and affect normal.        Behavior: Behavior normal.       Assessment & Plan:   Diagnoses and all orders for this visit:  Peptic ulcer of stomach, acute -     CBC with Differential/Platelet -     CMP14+EGFR  Osteoarthritis of spine with radiculopathy, cervical region -     diclofenac (VOLTAREN) 75 MG EC tablet; Take 1 tablet (75 mg total) by mouth daily. For muscle and  Joint pain  Lumbar spondylolysis -     diclofenac (VOLTAREN) 75 MG EC tablet; Take 1 tablet (75 mg total) by mouth  daily. For muscle and  Joint pain  Other orders -     atorvastatin (LIPITOR) 40 MG tablet; Take 1 tablet (40 mg total) by mouth daily. -     pantoprazole (PROTONIX) 40 MG tablet; Take 1 tablet (40 mg total) by mouth 2 (two) times daily.       I have changed Megan Chapman's pantoprazole and diclofenac. I am also having her maintain her aspirin, Vitamin D3, Fish Oil, levothyroxine, ALPRAZolam, and atorvastatin.  Allergies as of 02/14/2021      Reactions   Amoxicillin    Biaxin [clarithromycin]    Ciprofloxacin Nausea Only   Codeine    Nitrofurantoin Nausea And Vomiting, Nausea Only   Sulfa Antibiotics       Medication List       Accurate as of February 14, 2021  3:15 PM. If you have any questions, ask your nurse or doctor.        ALPRAZolam 0.5 MG tablet Commonly known as: XANAX Take 1 tablet (0.5 mg total) by mouth at bedtime as needed. for sleep   aspirin 81 MG tablet Take 81 mg by mouth daily.   atorvastatin 40 MG tablet Commonly known as: LIPITOR Take 1 tablet (40 mg total) by mouth daily.   diclofenac 75 MG EC tablet Commonly known as: VOLTAREN Take 1 tablet (75 mg total) by mouth daily. For muscle and  Joint pain What changed: when to take this Changed by: [name], MD   Fish Oil 1200 MG Caps Take 2 capsules by mouth daily.   levothyroxine 25 MCG tablet Commonly known as: SYNTHROID TAKE 1 TABLET DAILY BEFORE BREAKFAST   pantoprazole 40 MG tablet Commonly known as: PROTONIX Take 1 tablet (40 mg total) by mouth 2 (two) times daily. What changed: when to take this Changed by: [name], MD   Vitamin D3 25 MCG (1000 UT) Caps Take 2 capsules (2,000 Units total) by mouth daily.        Follow-up: Return in about 2 weeks (around 02/28/2021).  [name], M.D.

## 2021-02-14 NOTE — Patient Instructions (Signed)
Use maalox (OTC) as needed for the gnawing pain

## 2021-02-15 ENCOUNTER — Encounter: Payer: Self-pay | Admitting: Family Medicine

## 2021-02-15 LAB — CMP14+EGFR
ALT: 13 IU/L (ref 0–32)
AST: 13 IU/L (ref 0–40)
Albumin/Globulin Ratio: 1.9 (ref 1.2–2.2)
Albumin: 4.1 g/dL (ref 3.6–4.6)
Alkaline Phosphatase: 76 IU/L (ref 44–121)
BUN/Creatinine Ratio: 15 (ref 12–28)
BUN: 20 mg/dL (ref 8–27)
Bilirubin Total: 0.3 mg/dL (ref 0.0–1.2)
CO2: 22 mmol/L (ref 20–29)
Calcium: 9.8 mg/dL (ref 8.7–10.3)
Chloride: 101 mmol/L (ref 96–106)
Creatinine, Ser: 1.34 mg/dL — ABNORMAL HIGH (ref 0.57–1.00)
GFR calc Af Amer: 41 mL/min/{1.73_m2} — ABNORMAL LOW (ref >59)
GFR calc non Af Amer: 36 mL/min/{1.73_m2} — ABNORMAL LOW (ref >59)
Globulin, Total: 2.2 g/dL (ref 1.5–4.5)
Glucose: 92 mg/dL (ref 65–99)
Potassium: 5.7 mmol/L — ABNORMAL HIGH (ref 3.5–5.2)
Sodium: 141 mmol/L (ref 134–144)
Total Protein: 6.3 g/dL (ref 6.0–8.5)

## 2021-02-15 LAB — CBC WITH DIFFERENTIAL/PLATELET
Basophils Absolute: 0 10*3/uL (ref 0.0–0.2)
Basos: 0 %
EOS (ABSOLUTE): 0.4 10*3/uL (ref 0.0–0.4)
Eos: 5 %
Hematocrit: 31.3 % — ABNORMAL LOW (ref 34.0–46.6)
Hemoglobin: 10.4 g/dL — ABNORMAL LOW (ref 11.1–15.9)
Immature Grans (Abs): 0.1 10*3/uL (ref 0.0–0.1)
Immature Granulocytes: 1 %
Lymphocytes Absolute: 1.5 10*3/uL (ref 0.7–3.1)
Lymphs: 22 %
MCH: 29.9 pg (ref 26.6–33.0)
MCHC: 33.2 g/dL (ref 31.5–35.7)
MCV: 90 fL (ref 79–97)
Monocytes Absolute: 1 10*3/uL — ABNORMAL HIGH (ref 0.1–0.9)
Monocytes: 15 %
Neutrophils Absolute: 3.9 10*3/uL (ref 1.4–7.0)
Neutrophils: 57 %
Platelets: 273 10*3/uL (ref 150–450)
RBC: 3.48 x10E6/uL — ABNORMAL LOW (ref 3.77–5.28)
RDW: 13 % (ref 11.7–15.4)
WBC: 6.8 10*3/uL (ref 3.4–10.8)

## 2021-02-17 ENCOUNTER — Other Ambulatory Visit: Payer: Self-pay

## 2021-02-17 ENCOUNTER — Ambulatory Visit: Payer: Medicare Other | Admitting: *Deleted

## 2021-02-17 DIAGNOSIS — R710 Precipitous drop in hematocrit: Secondary | ICD-10-CM | POA: Diagnosis not present

## 2021-02-17 DIAGNOSIS — E875 Hyperkalemia: Secondary | ICD-10-CM

## 2021-02-17 DIAGNOSIS — M8949 Other hypertrophic osteoarthropathy, multiple sites: Secondary | ICD-10-CM

## 2021-02-17 DIAGNOSIS — I10 Essential (primary) hypertension: Secondary | ICD-10-CM | POA: Diagnosis not present

## 2021-02-17 DIAGNOSIS — M159 Polyosteoarthritis, unspecified: Secondary | ICD-10-CM

## 2021-02-17 DIAGNOSIS — K253 Acute gastric ulcer without hemorrhage or perforation: Secondary | ICD-10-CM

## 2021-02-17 NOTE — Chronic Care Management (AMB) (Signed)
Chronic Care Management   CCM RN Visit Note  02/13/2021 Name: Megan Chapman MRN: [id number] DOB: 1931/08/27  Subjective: Megan Chapman is a 87 y.o. year old female who is a primary care patient of Stacks, Cletus Gash, MD. The care management team was consulted for assistance with disease management and care coordination needs.    Engaged with patient by telephone for follow up visit in response to provider referral for case management and/or care coordination services.   Consent to Services:  The patient was given information about Chronic Care Management services, agreed to services, and gave verbal consent prior to initiation of services.  Please see initial visit note for detailed documentation.   Patient agreed to services and verbal consent obtained.   Assessment: Review of patient past medical history, allergies, medications, health status, including review of consultants reports, laboratory and other test data, was performed as part of comprehensive evaluation and provision of chronic care management services.   SDOH (Social Determinants of Health) assessments and interventions performed:  yes CCM Care Plan  Allergies  Allergen Reactions  . Amoxicillin   . Biaxin [Clarithromycin]   . Ciprofloxacin Nausea Only  . Codeine   . Nitrofurantoin Nausea And Vomiting and Nausea Only  . Sulfa Antibiotics     Outpatient Encounter Medications as of 02/13/2021  Medication Sig  . ALPRAZolam (XANAX) 0.5 MG tablet Take 1 tablet (0.5 mg total) by mouth at bedtime as needed. for sleep  . aspirin 81 MG tablet Take 81 mg by mouth daily.  . Cholecalciferol (VITAMIN D3) 1000 UNITS CAPS Take 2 capsules (2,000 Units total) by mouth daily.  [name] levothyroxine (SYNTHROID) 25 MCG tablet TAKE 1 TABLET DAILY BEFORE BREAKFAST  . Omega-3 Fatty Acids (FISH OIL) 1200 MG CAPS Take 2 capsules by mouth daily.  . [DISCONTINUED] atorvastatin (LIPITOR) 40 MG tablet Take 1 tablet (40 mg total) by mouth daily.  .  [DISCONTINUED] diclofenac (VOLTAREN) 75 MG EC tablet Take 1 tablet (75 mg total) by mouth 2 (two) times daily. For muscle and  Joint pain  . [DISCONTINUED] pantoprazole (PROTONIX) 40 MG tablet Take 1 tablet (40 mg total) by mouth daily.   No facility-administered encounter medications on file as of 02/13/2021.    Patient Active Problem List   Diagnosis Date Noted  . Palpitations 07/15/2019  . Essential hypertension 07/15/2019  . Osteoarthritis 04/17/2017  . Hypothyroidism 10/11/2015  . Vitamin D deficiency 10/11/2015  . Hyperlipidemia 06/08/2013    Conditions to be addressed/monitored: HTN, hypothyroidism, OA, HLD  Care Plan : RNCM: Upper GI Pain  Updates made by Ilean China, RN since 02/17/2021 12:00 AM    Problem: Upper GI Pain   Priority: High    Goal: Upper GI Pain Resolved   Start Date: 02/14/2020  This Visit's Progress: Not on track  Priority: High  Note:   Current Barriers:  . Care Coordination needs related to upper GI pain in a patient with HTN, hypothyroidism, OA, HLD  Nurse Case Manager Clinical Goal(s):  . patient will verbalize understanding of plan for treating upper GI pain . patient will work with Consulting civil engineer to address needs related to self-management of upper GI pain . Patient will have an office visit with PCP and/or gastroenterologist to discuss cause and treatment of upper GI Pain  Interventions:  . 1:1 collaboration with [name], MD regarding development and update of comprehensive plan of care as evidenced by provider attestation and co-signature . Inter-disciplinary care team collaboration (see longitudinal plan of care) .  Chart reviewed including relevant office notes, lab results, and last GI procedure note o Discussed last endoscopy report results with patient . Reviewed and discussed medications o Taking diclofenac for arthritis pain - Advised that this can cause GI upset and erosion o Continue protonix  . Encouraged patient to  schedule an appointment with Dr Ardis Hughs (GI) and PCP if unable to see Dr Ardis Hughs quickly o Provided with Dr Eugenia Pancoast telephone number [phone] . Discussed s/s of GI bleed o Black tarry stools with foul odor o Coffee ground emesis o Frank blood in stool . Discussed diet and recommended bland foods. Avoid milk and spicy foods.  . Call PCP with any new or worsening symptoms . Seek emergency medical attention for any black tarry stools, severe pain, vomiting with dark emesis  Patient Goals/Self-Care Activities Over the next 15 days, patient will: . Schedule an appointment with Dr Ardis Hughs [phone] and/or Dr Livia Snellen [phone] . Call PCP with any new or worsening symptoms . Seek emergency medical care if needed . Call RN care Manager [phone] as needed . Eat a bland diet and avoid spicy foods . Take protonix as prescribed . Hold diclofenac until you talk with provider      Follow Up Plan:   Telephone follow up appointment with care management team member scheduled for: 02/17/21  The patient has been provided with contact information for the care management team and has been advised to call with any health related questions or concerns.   Next PCP appointment scheduled for: 02/14/21 with Dr Livia Snellen  Chong Sicilian, BSN, RN-BC [place] [facility] [place] [facility] Direct Dial: [phone]

## 2021-02-17 NOTE — Chronic Care Management (AMB) (Signed)
Chronic Care Management   CCM RN Visit Note  02/17/2021 Name: Megan Chapman MRN: [id number] DOB: 06-10-1931  Subjective: Megan Chapman is a 87 y.o. year old female who is a primary care patient of Stacks, Cletus Gash, MD. The care management team was consulted for assistance with disease management and care coordination needs.    Engaged with patient by telephone for Follow-up telephone visit in response to provider referral for case management and/or care coordination services.   Consent to Services:  The patient was given information about Chronic Care Management services, agreed to services, and gave verbal consent prior to initiation of services.  Please see initial visit note for detailed documentation.   Patient agreed to services and verbal consent obtained.   Assessment: Review of patient past medical history, allergies, medications, health status, including review of consultants reports, laboratory and other test data, was performed as part of comprehensive evaluation and provision of chronic care management services.   SDOH (Social Determinants of Health) assessments and interventions performed:  no  CCM Care Plan  Allergies  Allergen Reactions  . Amoxicillin   . Biaxin [Clarithromycin]   . Ciprofloxacin Nausea Only  . Codeine   . Nitrofurantoin Nausea And Vomiting and Nausea Only  . Sulfa Antibiotics     Outpatient Encounter Medications as of 02/17/2021  Medication Sig  . ALPRAZolam (XANAX) 0.5 MG tablet Take 1 tablet (0.5 mg total) by mouth at bedtime as needed. for sleep  . aspirin 81 MG tablet Take 81 mg by mouth daily.  [name] atorvastatin (LIPITOR) 40 MG tablet Take 1 tablet (40 mg total) by mouth daily.  . Cholecalciferol (VITAMIN D3) 1000 UNITS CAPS Take 2 capsules (2,000 Units total) by mouth daily.  . diclofenac (VOLTAREN) 75 MG EC tablet Take 1 tablet (75 mg total) by mouth daily. For muscle and  Joint pain  . levothyroxine (SYNTHROID) 25 MCG tablet TAKE 1 TABLET DAILY  BEFORE BREAKFAST  . Omega-3 Fatty Acids (FISH OIL) 1200 MG CAPS Take 2 capsules by mouth daily.  . pantoprazole (PROTONIX) 40 MG tablet Take 1 tablet (40 mg total) by mouth 2 (two) times daily.   No facility-administered encounter medications on file as of 02/17/2021.    Patient Active Problem List   Diagnosis Date Noted  . Palpitations 07/15/2019  . Essential hypertension 07/15/2019  . Osteoarthritis 04/17/2017  . Hypothyroidism 10/11/2015  . Vitamin D deficiency 10/11/2015  . Hyperlipidemia 06/08/2013    Conditions to be addressed/monitored:HTN, hypothyroidism, OA, HLD  Care Plan : RNCM: Peptic Ulcer of Stomach  Updates made by Ilean China, RN since 02/17/2021 12:00 AM    Problem: Peptic Ulcer of Stomach   Priority: High    Goal: Upper GI Pain Resolved   Start Date: 02/14/2020  This Visit's Progress: Not on track  Recent Progress: Not on track  Priority: High  Note:   Current Barriers:  . Care Coordination needs related to upper GI pain in a patient with HTN, hypothyroidism, OA, HLD  Nurse Case Manager Clinical Goal(s):  . patient will verbalize understanding of plan for treating upper GI pain . patient will work with Consulting civil engineer to address needs related to self-management of upper GI pain . Patient will have an office visit with PCP and/or gastroenterologist to discuss cause and treatment of upper GI Pain  Interventions:  . 1:1 collaboration with [name], MD regarding development and update of comprehensive plan of care as evidenced by provider attestation and co-signature . Inter-disciplinary care team collaboration (  see longitudinal plan of care) . Chart reviewed including relevant office notes, lab results o Discussed recent visit with Dr Livia Snellen on 02/14/21 and dx of peptic ulcer of stomach o Reviewed and discussed labwork in detail and need to repeat labs in one week.  - Explained that diclofenac can cause kidney injury, as well as other causes, and  kidney function affects potassium. Potassium may be slightly elevated due to lab drawing technique as well. That is why it's important to repeat these tests.  - Discussed decreased hemoglobin and that a lowered hemoglobin could be a sign of a GI bleed, especially in the presence of suspected peptic ulcer in stomach - Lab orders have not been entered . Collaborated with [facility] clinical staff to place future order for BMP to reassess potassium and kidney functions and CBC to reassess decreased HGB o Advised patient to come in next week non-fasting for these labs . Reviewed and discussed medications o Diclofenac discontinued at visit on 02/14/21 o Taking tylenol arthritis. Reassured patient that this does not contain an NSAID and should be safe to take. o OTC liquid antacid to coat stomach and decrease acid production . Reviewed upcoming appointment with Dr Ardis Hughs on 03/03/21 and encouraged patient to keep this appointment in case symptoms don't resolve and further testing or management recommendations are needed . Reinforced s/s of GI bleed o Black tarry stools with foul odor o Coffee ground emesis o Frank blood in stool . Reinforced to avoid spicy foods and focus on bland foods. Discussed foods that are high in Vit A and may be helpful in healing peptic ulcers.  . Call PCP with any new or worsening symptoms . Reinforced need to seek emergency medical attention for any black tarry stools, severe pain, vomiting with dark emesis  Patient Goals/Self-Care Activities Over the next 15 days, patient will: . Keep appointment with Dr Ardis Hughs [phone] . Call PCP with any new or worsening symptoms . Seek emergency medical care if needed . Call RN care Manager [phone] as needed . Eat a bland diet and avoid spicy foods. Eat foods high in fiber and Vit A that might help to heal ulcer. o Cabbage, sweet potatoes, apples, pears, etc . Take protonix as prescribed . Take OTC liquid antacid . Continue to  hold diclofenac     Follow-up:   Telephone follow up appointment with care management team member scheduled for: 02/22/21 with RN Care Manager  The patient has been provided with contact information for the care management team and has been advised to call with any health related questions or concerns.   Next PCP appointment scheduled for: 06/06/21 with Dr Livia Snellen  Chong Sicilian, BSN, RN-BC [place] [facility] [place] Management Direct Dial: [phone]

## 2021-02-17 NOTE — Patient Instructions (Signed)
Visit Information  PATIENT GOALS: Goals Addressed            This Visit's Progress   . Upper GI Pain Resolved   Not on track    Timeframe:  Short-Term Goal Priority:  High Start Date: 02/13/21                            Expected End Date:  04/13/21                   Follow-up: 02/22/21  . Keep appointment with Dr Ardis Hughs [phone] . Call PCP with any new or worsening symptoms . Seek emergency medical care if needed . Call RN care Manager [phone] as needed . Eat a bland diet and avoid spicy foods. Eat foods high in fiber and Vit A that might help to heal ulcer. o Cabbage, sweet potatoes, apples, pears, etc . Take protonix as prescribed . Take OTC liquid antacid . Continue to hold diclofenac       Patient verbalizes understanding of instructions provided today and agrees to view in [place].   Follow-up:   Telephone follow up appointment with care management team member scheduled for: 02/22/21 with RN Care Manager  The patient has been provided with contact information for the care management team and has been advised to call with any health related questions or concerns.   Next PCP appointment scheduled for: 06/06/21 with Dr Livia Snellen  Chong Sicilian, BSN, RN-BC [place] [facility] [place] [facility] Direct Dial: [phone]

## 2021-02-17 NOTE — Patient Instructions (Signed)
Visit Information  PATIENT GOALS: Goals Addressed            This Visit's Progress   . Upper GI Pain Resolved       Timeframe:  Short-Term Goal Priority:  High Start Date: 02/13/21                            Expected End Date:  04/13/21                   Follow-up: 02/17/21  . Schedule an appointment with Dr Ardis Hughs [phone] and/or Dr Livia Snellen [phone] . Call PCP with any new or worsening symptoms . Seek emergency medical care if needed . Call RN care Manager [phone] as needed . Eat a bland diet and avoid spicy foods . Take protonix as prescribed . Hold diclofenac until you talk with provider       Patient verbalizes understanding of instructions provided today and agrees to view in [place].   Follow Up Plan:   Telephone follow up appointment with care management team member scheduled for: 02/17/21  The patient has been provided with contact information for the care management team and has been advised to call with any health related questions or concerns.   Next PCP appointment scheduled for: 02/14/21 with Dr Livia Snellen  Chong Sicilian, BSN, RN-BC [place] [facility] [place] [facility] Direct Dial: [phone]

## 2021-02-22 ENCOUNTER — Other Ambulatory Visit: Payer: Medicare Other

## 2021-02-22 ENCOUNTER — Other Ambulatory Visit: Payer: Self-pay

## 2021-02-22 ENCOUNTER — Ambulatory Visit: Payer: Medicare Other | Admitting: *Deleted

## 2021-02-22 DIAGNOSIS — R710 Precipitous drop in hematocrit: Secondary | ICD-10-CM | POA: Diagnosis not present

## 2021-02-22 DIAGNOSIS — I10 Essential (primary) hypertension: Secondary | ICD-10-CM

## 2021-02-22 DIAGNOSIS — E875 Hyperkalemia: Secondary | ICD-10-CM

## 2021-02-22 DIAGNOSIS — M8949 Other hypertrophic osteoarthropathy, multiple sites: Secondary | ICD-10-CM

## 2021-02-22 DIAGNOSIS — R1010 Upper abdominal pain, unspecified: Secondary | ICD-10-CM

## 2021-02-22 DIAGNOSIS — K253 Acute gastric ulcer without hemorrhage or perforation: Secondary | ICD-10-CM

## 2021-02-22 DIAGNOSIS — M159 Polyosteoarthritis, unspecified: Secondary | ICD-10-CM

## 2021-02-22 NOTE — Chronic Care Management (AMB) (Signed)
Chronic Care Management   CCM RN Visit Note  02/22/2021 Name: Megan Chapman MRN: [id number] DOB: Apr 16, 1931  Subjective: Megan Chapman is a 87 y.o. year old female who is a primary care patient of Stacks, Cletus Gash, MD. The care management team was consulted for assistance with disease management and care coordination needs.    Engaged with patient by telephone for follow up visit in response to provider referral for case management and/or care coordination services.   Consent to Services:  The patient was given information about Chronic Care Management services, agreed to services, and gave verbal consent prior to initiation of services.  Please see initial visit note for detailed documentation.   Patient agreed to services and verbal consent obtained.   Assessment: Review of patient past medical history, allergies, medications, health status, including review of consultants reports, laboratory and other test data, was performed as part of comprehensive evaluation and provision of chronic care management services.   SDOH (Social Determinants of Health) assessments and interventions performed:    CCM Care Plan  Allergies  Allergen Reactions  . Amoxicillin   . Biaxin [Clarithromycin]   . Ciprofloxacin Nausea Only  . Codeine   . Nitrofurantoin Nausea And Vomiting and Nausea Only  . Sulfa Antibiotics     Outpatient Encounter Medications as of 02/22/2021  Medication Sig  . ALPRAZolam (XANAX) 0.5 MG tablet Take 1 tablet (0.5 mg total) by mouth at bedtime as needed. for sleep  . aspirin 81 MG tablet Take 81 mg by mouth daily.  [name] atorvastatin (LIPITOR) 40 MG tablet Take 1 tablet (40 mg total) by mouth daily.  . Cholecalciferol (VITAMIN D3) 1000 UNITS CAPS Take 2 capsules (2,000 Units total) by mouth daily.  . diclofenac (VOLTAREN) 75 MG EC tablet Take 1 tablet (75 mg total) by mouth daily. For muscle and  Joint pain  . levothyroxine (SYNTHROID) 25 MCG tablet TAKE 1 TABLET DAILY BEFORE  BREAKFAST  . Omega-3 Fatty Acids (FISH OIL) 1200 MG CAPS Take 2 capsules by mouth daily.  . pantoprazole (PROTONIX) 40 MG tablet Take 1 tablet (40 mg total) by mouth 2 (two) times daily.   No facility-administered encounter medications on file as of 02/22/2021.    Patient Active Problem List   Diagnosis Date Noted  . Palpitations 07/15/2019  . Essential hypertension 07/15/2019  . Osteoarthritis 04/17/2017  . Hypothyroidism 10/11/2015  . Vitamin D deficiency 10/11/2015  . Hyperlipidemia 06/08/2013    Conditions to be addressed/monitored: HTN, hypothyroidism, OA, HLD, GERD, peptic ulcer of stomach  Care Plan : RNCM: Peptic Ulcer of Stomach  Updates made by Ilean China, RN since 02/22/2021 12:00 AM    Problem: Peptic Ulcer of Stomach   Priority: High    Goal: Upper GI Pain Resolved   Start Date: 02/14/2020  This Visit's Progress: On track  Recent Progress: Not on track  Priority: High  Note:   Current Barriers:  . Care Coordination needs related to upper GI pain in a patient with HTN, hypothyroidism, OA, HLD  Nurse Case Manager Clinical Goal(s):  . patient will verbalize understanding of plan for treating upper GI pain . patient will work with Consulting civil engineer to address needs related to self-management of upper GI pain . Patient will keep appointment with gastroenterologist on 03/03/21 to discuss cause and treatment of upper GI Pain  Interventions:  . 1:1 collaboration with [name], MD regarding development and update of comprehensive plan of care as evidenced by provider attestation and co-signature . Inter-disciplinary  care team collaboration (see longitudinal plan of care) . Chart reviewed including relevant office notes, lab results o Discussed recent visit with Dr Livia Snellen on 02/14/21 and dx of peptic ulcer of stomach o Previously reviewed and discussed labwork in detail and need to repeat labs in one week.  Nash Dimmer with [facility] clinical staff to place future  order for BMP to reassess potassium and kidney functions and CBC to reassess decreased HGB o Patient is going in to the office today to have lab work done o Results should be back tomorrow for PCP review o RNCM will talk with patient after results are in . Reviewed and discussed medications o Diclofenac discontinued at visit on 02/14/21 o Taking tylenol arthritis. Reassured patient that this does not contain an NSAID and should be safe to take. o OTC liquid antacid to coat stomach and decrease acid production . Reviewed upcoming appointment with gastroenterologist on 03/03/21 and advised to keep this appointment . Reinforced s/s of GI bleed o Black tarry stools with foul odor o Coffee ground emesis o Frank blood in stool . Discussed diet o Patient has been eating a bland diet and avoiding spicy or fried/greasy foods. She's getting a little bored with her food selection o Reinforced to avoid spicy foods but can try adding in some of her usual meals to see how she tolerates them (hamburger, steak, etc) o Previously discussed foods that are high in Vit A and may be helpful in healing peptic ulcers.  . Discussed that she hasn't had any gnawing abdominal pain in 5 days . Call PCP with any new or worsening symptoms . Reinforced need to seek emergency medical attention for any black tarry stools, severe pain, vomiting with dark emesis . Call RN Care Manager [phone] as needed .   Patient Goals/Self-Care Activities Over the next 10 days, patient will: . Keep appointment with [facility] [phone]) on 03/03/21 . Call PCP with any new or worsening symptoms . Seek emergency medical care if needed o Black/tarry stools, vomit that looks like coffee grounds . Call RN care Manager [phone] as needed . Avoid spicy and fried/greasy foods . Eat foods high in fiber and Vit A that might help to heal ulcer. o Cabbage, sweet potatoes, apples, pears, etc . Can try adding in some normal food items to  see how she tolerates them . Take protonix as prescribed . Take OTC liquid antacid . Continue to hold diclofenac     Follow Up Plan:  RNCM will follow-up via telephone over the next 7 days The patient has been provided with contact information for the care management team and has been advised to call with any health related questions or concerns.  Next PCP appointment scheduled for: 06/06/21 with Dr Lauree Chandler GI appointment scheduled for: 03/03/21  Chong Sicilian, BSN, RN-BC [place] [facility] [place] [facility] Direct Dial: [phone]

## 2021-02-22 NOTE — Patient Instructions (Signed)
Visit Information  PATIENT GOALS: Goals Addressed            This Visit's Progress   . Upper GI Pain Resolved   On track    Timeframe:  Short-Term Goal Priority:  High Start Date: 02/13/21                            Expected End Date:  04/13/21                   Follow-up: 02/22/21  . Keep appointment with [facility] [phone]) on 03/03/21 . Call PCP with any new or worsening symptoms . Seek emergency medical care if needed o Black/tarry stools, vomit that looks like coffee grounds . Call RN care Manager [phone] as needed . Avoid spicy and fried/greasy foods . Eat foods high in fiber and Vit A that might help to heal ulcer. o Cabbage, sweet potatoes, apples, pears, etc . Can try adding in some normal food items to see how she tolerates them . Take protonix as prescribed . Take OTC liquid antacid . Continue to hold diclofenac       Patient verbalizes understanding of instructions provided today and agrees to view in [place].   Follow Up Plan:  RNCM will follow-up via telephone over the next 7 days The patient has been provided with contact information for the care management team and has been advised to call with any health related questions or concerns.  Next PCP appointment scheduled for: 06/06/21 with Dr Lauree Chandler GI appointment scheduled for: 03/03/21   Chong Sicilian, BSN, RN-BC [place] [facility] [place] [facility] Direct Dial: [phone]

## 2021-02-23 ENCOUNTER — Ambulatory Visit: Payer: Medicare Other | Admitting: *Deleted

## 2021-02-23 DIAGNOSIS — I10 Essential (primary) hypertension: Secondary | ICD-10-CM

## 2021-02-23 DIAGNOSIS — M47812 Spondylosis without myelopathy or radiculopathy, cervical region: Secondary | ICD-10-CM | POA: Diagnosis not present

## 2021-02-23 DIAGNOSIS — M8949 Other hypertrophic osteoarthropathy, multiple sites: Secondary | ICD-10-CM | POA: Diagnosis not present

## 2021-02-23 DIAGNOSIS — R710 Precipitous drop in hematocrit: Secondary | ICD-10-CM

## 2021-02-23 DIAGNOSIS — M159 Polyosteoarthritis, unspecified: Secondary | ICD-10-CM

## 2021-02-23 DIAGNOSIS — M542 Cervicalgia: Secondary | ICD-10-CM | POA: Diagnosis not present

## 2021-02-23 DIAGNOSIS — K253 Acute gastric ulcer without hemorrhage or perforation: Secondary | ICD-10-CM

## 2021-02-23 DIAGNOSIS — M9901 Segmental and somatic dysfunction of cervical region: Secondary | ICD-10-CM | POA: Diagnosis not present

## 2021-02-23 LAB — CBC WITH DIFFERENTIAL/PLATELET
Basophils Absolute: 0 10*3/uL (ref 0.0–0.2)
Basos: 1 %
EOS (ABSOLUTE): 0.3 10*3/uL (ref 0.0–0.4)
Eos: 7 %
Hematocrit: 32.6 % — ABNORMAL LOW (ref 34.0–46.6)
Hemoglobin: 10.5 g/dL — ABNORMAL LOW (ref 11.1–15.9)
Immature Grans (Abs): 0 10*3/uL (ref 0.0–0.1)
Immature Granulocytes: 1 %
Lymphocytes Absolute: 1.3 10*3/uL (ref 0.7–3.1)
Lymphs: 33 %
MCH: 29.2 pg (ref 26.6–33.0)
MCHC: 32.2 g/dL (ref 31.5–35.7)
MCV: 91 fL (ref 79–97)
Monocytes Absolute: 0.9 10*3/uL (ref 0.1–0.9)
Monocytes: 22 %
Neutrophils Absolute: 1.5 10*3/uL (ref 1.4–7.0)
Neutrophils: 36 %
Platelets: 286 10*3/uL (ref 150–450)
RBC: 3.6 x10E6/uL — ABNORMAL LOW (ref 3.77–5.28)
RDW: 13.1 % (ref 11.7–15.4)
WBC: 4.1 10*3/uL (ref 3.4–10.8)

## 2021-02-23 LAB — CMP14+EGFR
ALT: 12 IU/L (ref 0–32)
AST: 15 IU/L (ref 0–40)
Albumin/Globulin Ratio: 1.6 (ref 1.2–2.2)
Albumin: 4.1 g/dL (ref 3.6–4.6)
Alkaline Phosphatase: 70 IU/L (ref 44–121)
BUN/Creatinine Ratio: 17 (ref 12–28)
BUN: 20 mg/dL (ref 8–27)
Bilirubin Total: 0.3 mg/dL (ref 0.0–1.2)
CO2: 23 mmol/L (ref 20–29)
Calcium: 10.1 mg/dL (ref 8.7–10.3)
Chloride: 102 mmol/L (ref 96–106)
Creatinine, Ser: 1.2 mg/dL — ABNORMAL HIGH (ref 0.57–1.00)
GFR calc Af Amer: 47 mL/min/{1.73_m2} — ABNORMAL LOW (ref >59)
GFR calc non Af Amer: 41 mL/min/{1.73_m2} — ABNORMAL LOW (ref >59)
Globulin, Total: 2.6 g/dL (ref 1.5–4.5)
Glucose: 89 mg/dL (ref 65–99)
Potassium: 4.9 mmol/L (ref 3.5–5.2)
Sodium: 141 mmol/L (ref 134–144)
Total Protein: 6.7 g/dL (ref 6.0–8.5)

## 2021-02-23 NOTE — Chronic Care Management (AMB) (Signed)
Chronic Care Management   CCM RN Visit Note  02/23/2021 Name: Megan Chapman MRN: [id number] DOB: [birth year]  Subjective: Megan Chapman is a 87 y.o. year old female who is a primary care patient of Stacks, Cletus Gash, MD. The care management team was consulted for assistance with disease management and care coordination needs.    Engaged with patient by telephone for follow up visit in response to provider referral for case management and/or care coordination services.   Consent to Services:  The patient was given information about Chronic Care Management services, agreed to services, and gave verbal consent prior to initiation of services.  Please see initial visit note for detailed documentation.   Patient agreed to services and verbal consent obtained.   Assessment: Review of patient past medical history, allergies, medications, health status, including review of consultants reports, laboratory and other test data, was performed as part of comprehensive evaluation and provision of chronic care management services.   SDOH (Social Determinants of Health) assessments and interventions performed:    CCM Care Plan  Allergies  Allergen Reactions  . Amoxicillin   . Biaxin [Clarithromycin]   . Ciprofloxacin Nausea Only  . Codeine   . Nitrofurantoin Nausea And Vomiting and Nausea Only  . Sulfa Antibiotics     Outpatient Encounter Medications as of 02/23/2021  Medication Sig  . ALPRAZolam (XANAX) 0.5 MG tablet Take 1 tablet (0.5 mg total) by mouth at bedtime as needed. for sleep  . aspirin 81 MG tablet Take 81 mg by mouth daily.  [name] atorvastatin (LIPITOR) 40 MG tablet Take 1 tablet (40 mg total) by mouth daily.  . Cholecalciferol (VITAMIN D3) 1000 UNITS CAPS Take 2 capsules (2,000 Units total) by mouth daily.  . diclofenac (VOLTAREN) 75 MG EC tablet Take 1 tablet (75 mg total) by mouth daily. For muscle and  Joint pain  . levothyroxine (SYNTHROID) 25 MCG tablet TAKE 1 TABLET DAILY BEFORE  BREAKFAST  . Omega-3 Fatty Acids (FISH OIL) 1200 MG CAPS Take 2 capsules by mouth daily.  . pantoprazole (PROTONIX) 40 MG tablet Take 1 tablet (40 mg total) by mouth 2 (two) times daily.   No facility-administered encounter medications on file as of 02/23/2021.    Patient Active Problem List   Diagnosis Date Noted  . Palpitations 07/15/2019  . Essential hypertension 07/15/2019  . Osteoarthritis 04/17/2017  . Hypothyroidism 10/11/2015  . Vitamin D deficiency 10/11/2015  . Hyperlipidemia 06/08/2013     Conditions to be addressed/monitored: HTN, hypothyroidism, OA, HLD, GERD, peptic ulcer of stomach  Care Plan : RNCM: Hypertension (Adult)  Updates made by Ilean China, RN since 02/23/2021 12:00 AM    Care Plan : RNCM: Osteoarthritis (Adult)  Updates made by Ilean China, RN since 02/23/2021 12:00 AM    Problem: Pain (Osteoarthritis)   Priority: High    Long-Range Goal: Manage Pain   Recent Progress: On track  Priority: High  Note:   Current Barriers:  . Chronic Disease Management support and education needs related to arthritis and chronic pain associated with arthritis  Nurse Case Manager Clinical Goal(s):  . patient will work with PCP to address needs related to osteoarthritis pain. . Patient will talk with RN Care Manager in reference to self-management of osteoarthritis pain.  Interventions:  . 1:1 collaboration with [name], MD regarding development and update of comprehensive plan of care as evidenced by provider attestation and co-signature . Inter-disciplinary care team collaboration (see longitudinal plan of care) . Evaluation of current treatment plan  related to osteoarthritis and patient's adherence to plan as established by provider. . Chart reviewed including relevant office notes, lab results, and imaging reports . Discussed imaging reports and dx of arthritis . Reviewed and discussed medications o Previously discussed that voltaren is an NSAID and  can potentially affect kidney function over time, cause GI problems, and increase blood pressure o Voltaren was d/c due to upper GI pain and suspected peptic stomach ulcer and decreased kidney functions . Discussed chiropractor visits o Seeing Dr Bethann Goo in [place] 2-3 times a month with a plan of decreasing to once a month for maintenance  . Discussed effect of pain on mobility and daily activities . Encouraged patient to reach out to PCP with any new or worsening symptoms . Encouraged patient to reach out to [place] as needed  Patient Goals/Self-Care Activities Over the next 60 days, patient will: . plan exercise or activity when pain is best controlled . track what makes the pain worse and what makes it better . Tylenol or Tylenol Arthritis for pain relief . Do not take any NSAIDs (Ibuprofen, aleve, diclofenac, etc) . use ice or heat for pain relief  . keep all medical appointments . call PCP with any new or worsening symptoms    Care Plan : RNCM: Peptic Ulcer of Stomach  Updates made by Ilean China, RN since 02/23/2021 12:00 AM    Problem: Peptic Ulcer of Stomach   Priority: High    Goal: Upper GI Pain Resolved   Start Date: 02/14/2020  Recent Progress: On track  Priority: High  Note:   Current Barriers:  . Care Coordination needs related to upper GI pain in a patient with HTN, hypothyroidism, OA, HLD  Nurse Case Manager Clinical Goal(s):  . patient will verbalize understanding of plan for treating upper GI pain . patient will work with Consulting civil engineer to address needs related to self-management of upper GI pain . Patient will keep appointment with gastroenterologist on 03/03/21 to discuss cause and treatment of upper GI Pain  Interventions:  . 1:1 collaboration with [name], MD regarding development and update of comprehensive plan of care as evidenced by provider attestation and co-signature . Inter-disciplinary care team collaboration (see  longitudinal plan of care) . Chart reviewed including relevant office notes, lab results o Discussed recent visit with Dr Livia Snellen on 02/14/21 and dx of peptic ulcer of stomach o Previously reviewed and discussed labwork in detail and need to repeat labs in one week.  Nash Dimmer with [facility] clinical staff to place future order for BMP to reassess potassium and kidney functions and CBC to reassess decreased HGB o Talked with patient about CMP and CBC results. Advised that PCP has not reviewed yet and she will get another call from [facility] clinical staff with Dr Artemio Aly thoughts and recommendations o Advised that Hgb is stable and that kidney functions are improving . Provided general education on pathophysiology of the kidneys and what the kidney functions mean . Reviewed and discussed medications o Diclofenac discontinued at visit on 02/14/21 o Taking tylenol arthritis. Reassured patient that this does not contain an NSAID and should be safe to take. o OTC liquid antacid to coat stomach and decrease acid production . Reviewed upcoming appointment with gastroenterologist on 03/03/21 and advised to keep this appointment . Reinforced s/s of GI bleed o Black tarry stools with foul odor o Coffee ground emesis o Frank blood in stool . Discussed diet o Patient has been  eating a bland diet and avoiding spicy or fried/greasy foods. She's getting a little bored with her food selection o Reinforced to avoid spicy foods but can try adding in some of her usual meals to see how she tolerates them (hamburger, steak, etc) o Previously discussed foods that are high in Vit A and may be helpful in healing peptic ulcers.  . Discussed that she hasn't had any gnawing abdominal pain in 6 days . Call PCP with any new or worsening symptoms . Reinforced need to seek emergency medical attention for any black tarry stools, severe pain, vomiting with dark emesis . Call RN Care Manager [phone] as needed  Patient  Goals/Self-Care Activities Over the next 10 days, patient will: . Keep appointment with [facility] [phone]) on 03/03/21 . Call PCP with any new or worsening symptoms . Seek emergency medical care if needed o Black/tarry stools, vomit that looks like coffee grounds . Call RN care Manager [phone] as needed . Avoid spicy and fried/greasy foods . Eat foods high in fiber and Vit A that might help to heal ulcer. o Cabbage, sweet potatoes, apples, pears, etc . Can try adding in some normal food items to see how she tolerates them . Take protonix as prescribed . Take OTC liquid antacid . Continue to hold diclofenac    Follow Up Plan:  . Telephone follow up appointment with care management team member scheduled for: 03/08/21 with RN Care Manager . The patient has been provided with contact information for the care management team and has been advised to call with any health related questions or concerns.  . Follow up with PCP on 06/06/21 . Appointment with [facility] on 03/01/21   Chong Sicilian, BSN, RN-BC [place] [facility] [place] Management Direct Dial: [phone]

## 2021-02-23 NOTE — Patient Instructions (Signed)
Visit Information  PATIENT GOALS: Goals Addressed            This Visit's Progress   . Manage Pain-Osteoarthritis       Timeframe:  Long-Range Goal Priority:  High Start Date:                             Expected End Date:                       Follow Up Date 03/08/21   . plan exercise or activity when pain is best controlled . track what makes the pain worse and what makes it better . Tylenol or Tylenol Arthritis for pain relief . Do not take any NSAIDs (Ibuprofen, aleve, diclofenac, etc) . use ice or heat for pain relief  . keep all medical appointments . call PCP with any new or worsening symptoms   Why is this important?    Day-to-day life can be hard when you have joint pain.   Pain medicine is just one piece of the treatment puzzle. There are many things you can do to manage pain and stay strong.    Lifestyle changes like stopping smoking and eating foods with Vitamin D and calcium keeps your bones and muscles healthy. Your joints are better when supported by strong muscles.   You can try these action steps to help you manage your pain.     Notes:     . Upper GI Pain Resolved       Timeframe:  Short-Term Goal Priority:  High Start Date: 02/13/21                            Expected End Date:  04/13/21                   Follow-up: 03/08/21  . Keep appointment with [facility] [phone]) on 03/03/21 . Call PCP with any new or worsening symptoms . Seek emergency medical care if needed o Black/tarry stools, vomit that looks like coffee grounds . Call RN care Manager [phone] as needed . Avoid spicy and fried/greasy foods . Eat foods high in fiber and Vit A that might help to heal ulcer. o Cabbage, sweet potatoes, apples, pears, etc . Can try adding in some normal food items to see how she tolerates them . Take protonix as prescribed . Take OTC liquid antacid . Continue to hold diclofenac       Patient verbalizes understanding of instructions provided today  and agrees to view in [place].   Follow Up Plan:  . Telephone follow up appointment with care management team member scheduled for: 03/08/21 with RN Care Manager . The patient has been provided with contact information for the care management team and has been advised to call with any health related questions or concerns.  . Follow up with PCP on 06/06/21 . Appointment with [facility] on 03/01/21   Chong Sicilian, BSN, RN-BC [place] [facility] [place] Management Direct Dial: [phone]

## 2021-03-01 ENCOUNTER — Encounter: Payer: Self-pay | Admitting: Nurse Practitioner

## 2021-03-01 ENCOUNTER — Ambulatory Visit (INDEPENDENT_AMBULATORY_CARE_PROVIDER_SITE_OTHER): Payer: Medicare Other | Admitting: Nurse Practitioner

## 2021-03-01 VITALS — BP 126/88 | HR 91 | Ht 62.0 in | Wt 143.4 lb

## 2021-03-01 DIAGNOSIS — R1013 Epigastric pain: Secondary | ICD-10-CM

## 2021-03-01 DIAGNOSIS — D649 Anemia, unspecified: Secondary | ICD-10-CM

## 2021-03-01 NOTE — Progress Notes (Signed)
ASSESSMENT AND PLAN    # 87 yo female with 2 month history of intermittent, severe epigastric pain with occasional radiation through to back. Recent labs reviewed, she also has new anemia.  Rule out PUD in setting of diclofenac ( and low dose baby asa).  Hgb has drifted from baseline of 12.4 to 10.4.  No overt GI bleeding.  --Repeat CBC today to ensure hemoglobin is stable --Liver chemistries normal x2 last month. Lipase not obtained but given episodic nature of the pain, pancreatitis seems --She has discontinued diclofenac last week.  --She also stopped pantoprazole last week after reading about potential side effects from long-term use.  I have asked her to resume pantoprazole.  Patient concerned about taking omeprazole before breakfast as she has to separate all medications from Synthroid.  She can take the pantoprazole 30 minutes before lunch.  --If EGD negative and pain persists consider RUQ ultrasound to rule out biliary disease  # Mild AKI, improving. Cr 1.34 >> 1.2   HISTORY OF PRESENT ILLNESS     Primary Gastroenterologist : Oretha Caprice, MD      Chief Complaint : abdominal pain  Megan Chapman is a 87 y.o. female with PMH of GERD, arthritis, basal cell cancer, HTN, hyperlipidemia, thyroid disease.   Patient seen last in Feb 2021 for odynophagia / dysphagia. Subsequent EGD remarkable for esophageal stenosis. She is here with her son for evaluation of abdominal pain.   Patient seen at [facility] 02/06/20 for a several week history of epigastric pain. She was given maalox and Xylocaine. The epigastric pain has radiated through to her back at times. She has experienced the pain after eating but at random times as well.  No associated nausea or vomiting. She has been on Pantoprazole for about one year. She has been taking Diclofenac for about the same amount of time and is also on a low dose daily aspirin.  Patient saw PCP on 2/15.  Her liver chemistries were normal. WBC normal. Hgb 10.4.  Patient told she probably had an ulcer, possibly related to Diclofenac. Her PPI was increased to BID and Diclofenac was discontinued. Patient and son read about side effects of PPI and decided to stop it all together last week. She was doing better but had recurrent epigastric pain last night after eating fried fish.     Data Reviewed:  Component     Latest Ref Rng & Units 05/31/2020 12/06/2020 02/14/2021  RBC     3.77 - 5.28 x10E6/uL 4.28 3.68 (L) 3.48 (L)  Hemoglobin     11.1 - 15.9 g/dL 12.4 11.1 10.4 (L)  HCT     34.0 - 46.6 % 37.3 33.2 (L) 31.3 (L)    Previous Endoscopic Evaluations / Pertinent Studies   Feb 2021 EGD for dysphagia.  --Benign-appearing esophageal stenosis (Schatzki's ring vs. focal peptic stricture). Dilated up to 3m. - The examination was otherwise normal.  Past Medical History:  Diagnosis Date  . Allergy   . Anxiety   . Arthritis   . Asthma    not treated  . Basal cell carcinoma   . Cataract   . GERD (gastroesophageal reflux disease)    Not treated  . HTN (hypertension)   . Hyperlipidemia   . Migraine   . Palpitations   . Thyroid disease    hypothyroid     Past Surgical History:  Procedure Laterality Date  . ABDOMINAL HYSTERECTOMY     partial  . APPENDECTOMY    .  BASAL CELL CARCINOMA EXCISION  2017   nose  . CATARACT EXTRACTION Bilateral   . COLONOSCOPY    . MENISCUS REPAIR Right    03/11/15  . PARTIAL KNEE ARTHROPLASTY Left   . TONSILLECTOMY     Family History  Problem Relation Age of Onset  . Cancer Mother        colon met to lung  . Colon cancer Mother   . Cancer Father        prostate - met to bone  . Cancer Brother   . Stroke Brother   . Migraines Brother   . COPD Son   . Migraines Son   . Down syndrome Daughter   . Esophageal cancer Neg Hx   . Rectal cancer Neg Hx   . Stomach cancer Neg Hx    Social History   Tobacco Use  . Smoking status: Never Smoker  . Smokeless tobacco: Never Used  Vaping Use  . Vaping Use:  Never used  Substance Use Topics  . Alcohol use: No  . Drug use: No   Current Outpatient Medications  Medication Sig Dispense Refill  . ALPRAZolam (XANAX) 0.5 MG tablet Take 1 tablet (0.5 mg total) by mouth at bedtime as needed. for sleep 90 tablet 1  . aspirin 81 MG tablet Take 81 mg by mouth daily.    [name] atorvastatin (LIPITOR) 40 MG tablet Take 1 tablet (40 mg total) by mouth daily. 90 tablet 1  . Cholecalciferol (VITAMIN D3) 1000 UNITS CAPS Take 2 capsules (2,000 Units total) by mouth daily. 30 capsule 11  . diclofenac (VOLTAREN) 75 MG EC tablet Take 1 tablet (75 mg total) by mouth daily. For muscle and  Joint pain 30 tablet 2  . levothyroxine (SYNTHROID) 25 MCG tablet TAKE 1 TABLET DAILY BEFORE BREAKFAST 90 tablet 1  . Omega-3 Fatty Acids (FISH OIL) 1200 MG CAPS Take 2 capsules by mouth daily.    . pantoprazole (PROTONIX) 40 MG tablet Take 1 tablet (40 mg total) by mouth 2 (two) times daily. 180 tablet 3   No current facility-administered medications for this visit.   Allergies  Allergen Reactions  . Amoxicillin   . Biaxin [Clarithromycin]   . Ciprofloxacin Nausea Only  . Codeine   . Nitrofurantoin Nausea And Vomiting and Nausea Only  . Sulfa Antibiotics      Review of Systems: All systems reviewed and negative except where noted in HPI.   PHYSICAL EXAM :    Wt Readings from Last 3 Encounters:  02/14/21 144 lb 4 oz (65.4 kg)  12/06/20 149 lb 4 oz (67.7 kg)  10/04/20 150 lb (68 kg)    BP 126/88   Pulse 91   Ht 5' 2" (1.575 m)   Wt 143 lb 6.4 oz (65 kg)   SpO2 98%   BMI 26.23 kg/m  Constitutional:  Pleasant female in no acute distress. Psychiatric: Normal mood and affect. Behavior is normal. EENT: Pupils normal.  Conjunctivae are normal. No scleral icterus. Neck supple.  Cardiovascular: Normal rate, regular rhythm. No edema Pulmonary/chest: Effort normal and breath sounds normal. No wheezing, rales or rhonchi. Abdominal: Soft, nondistended, nontender. Bowel  sounds active throughout. There are no masses palpable. No hepatomegaly. Neurological: Alert and oriented to person place and time. Skin: Skin is warm and dry. No rashes noted.  Tye Savoy, NP  03/01/2021, 10:41 AM  Cc:  [name], MD

## 2021-03-01 NOTE — Patient Instructions (Addendum)
If you are age 85 or older, your body mass index should be between 23-30. Your Body mass index is 26.23 kg/m. If this is out of the aforementioned range listed, please consider follow up with your Primary Care Provider.  PROCEDURES: You have been scheduled for a endoscopy. Please follow the written instructions given to you at your visit today. If you use inhalers (even only as needed), please bring them with you on the day of your procedure.   Continue Pantoprazole 30 minutes before lunch.  Stop taking Diclofenac.  It was great seeing you today! Thank you for entrusting me with your care and choosing [facility].  Tye Savoy, NP

## 2021-03-02 NOTE — Progress Notes (Signed)
I agree with the above note, plan 

## 2021-03-03 ENCOUNTER — Encounter: Payer: Self-pay | Admitting: Gastroenterology

## 2021-03-03 ENCOUNTER — Other Ambulatory Visit: Payer: Self-pay

## 2021-03-03 ENCOUNTER — Ambulatory Visit (AMBULATORY_SURGERY_CENTER): Payer: Medicare Other | Admitting: Gastroenterology

## 2021-03-03 VITALS — BP 129/61 | HR 77 | Temp 97.7°F | Resp 17 | Ht 62.0 in | Wt 143.0 lb

## 2021-03-03 DIAGNOSIS — K222 Esophageal obstruction: Secondary | ICD-10-CM | POA: Diagnosis not present

## 2021-03-03 DIAGNOSIS — K259 Gastric ulcer, unspecified as acute or chronic, without hemorrhage or perforation: Secondary | ICD-10-CM | POA: Diagnosis not present

## 2021-03-03 DIAGNOSIS — R1013 Epigastric pain: Secondary | ICD-10-CM

## 2021-03-03 DIAGNOSIS — K2950 Unspecified chronic gastritis without bleeding: Secondary | ICD-10-CM | POA: Diagnosis not present

## 2021-03-03 DIAGNOSIS — K449 Diaphragmatic hernia without obstruction or gangrene: Secondary | ICD-10-CM | POA: Diagnosis not present

## 2021-03-03 DIAGNOSIS — K2970 Gastritis, unspecified, without bleeding: Secondary | ICD-10-CM

## 2021-03-03 DIAGNOSIS — D649 Anemia, unspecified: Secondary | ICD-10-CM | POA: Diagnosis not present

## 2021-03-03 DIAGNOSIS — K319 Disease of stomach and duodenum, unspecified: Secondary | ICD-10-CM

## 2021-03-03 MED ORDER — SODIUM CHLORIDE 0.9 % IV SOLN: 500.0000 mL | Freq: Once | INTRAVENOUS | Status: DC

## 2021-03-03 NOTE — Progress Notes (Signed)
pt tolerated well. VSS. awake and to recovery. Report given to RN. Bite block removed with ease. Atraumatic. 

## 2021-03-03 NOTE — Op Note (Signed)
[facility] Patient Name: Megan Chapman Procedure Date: 03/03/2021 2:42 PM MRN: [id number] Endoscopist: Milus Banister , MD Age: 87 Referring MD:  Date of Birth: [birth year] Gender: Female Account #: [id number] Procedure:                Upper GI endoscopy Indications:              Epigastric abdominal pain, weight loss Medicines:                Monitored Anesthesia Care Procedure:                Pre-Anesthesia Assessment:                           - Prior to the procedure, a History and Physical                            was performed, and patient medications and                            allergies were reviewed. The patient's tolerance of                            previous anesthesia was also reviewed. The risks                            and benefits of the procedure and the sedation                            options and risks were discussed with the patient.                            All questions were answered, and informed consent                            was obtained. Prior Anticoagulants: The patient has                            taken no previous anticoagulant or antiplatelet                            agents. ASA Grade Assessment: III - A patient with                            severe systemic disease. After reviewing the risks                            and benefits, the patient was deemed in                            satisfactory condition to undergo the procedure.                           After obtaining informed consent, the endoscope was  passed under direct vision. Throughout the                            procedure, the patient's blood pressure, pulse, and                            oxygen saturations were monitored continuously. The                            Endoscope was introduced through the mouth, and                            advanced to the second part of duodenum. The upper                            GI endoscopy  was accomplished without difficulty.                            The patient tolerated the procedure well. Scope In: Scope Out: Findings:                 Thick Schatzki's ring above a small hiatal hernia,                            not dilated due to lack of dysphagia.                           A small, somewhat cratered but clean based 2-46mm                            ulcer was noted in the pre-pylorus. This was not                            malignant appearing. The mucosa through the distal                            stomach was otherwise moderately inflammed.                            Biopsies were taken with a cold forceps for                            histology.                           The exam was otherwise without abnormality. Complications:            No immediate complications. Estimated blood loss:                            None. Estimated Blood Loss:     Estimated blood loss: none. Impression:               - Thick Schatzki's ring above a small hiatal  hernia, not dilated due to lack of dysphagia.                           - A small, somewhat cratered but clean based 2-63mm                            ulcer was noted in the pre-pylorus. This was not                            malignant appearing. The mucosa through the distal                            stomach was otherwise moderately inflammed.                            Biopsies were taken with a cold forceps for                            histology.                           - The examination was otherwise normal. Recommendation:           - Patient has a contact number available for                            emergencies. The signs and symptoms of potential                            delayed complications were discussed with the                            patient. Return to normal activities tomorrow.                            Written discharge instructions were provided to the                             patient.                           - Resume previous diet.                           - Await pathology results. Will start on                            appropriate antibiotics if + for H. pylori.                           - New prescription today for TWICE daily                            pantoprazole instead of once daily. (pantoprazole  40mg  pills, one pill twice daily, disp 60 wiht 3                            refills. Milus Banister, MD 03/03/2021 3:01:06 PM This report has been signed electronically.

## 2021-03-03 NOTE — Patient Instructions (Signed)
YOU HAD AN ENDOSCOPIC PROCEDURE TODAY AT THE [facility]:   Refer to the procedure report that was given to you for any specific questions about what was found during the examination.  If the procedure report does not answer your questions, please call your gastroenterologist to clarify.  If you requested that your care partner not be given the details of your procedure findings, then the procedure report has been included in a sealed envelope for you to review at your convenience later.  YOU SHOULD EXPECT: Some feelings of bloating in the abdomen. Passage of more gas than usual.  Walking can help get rid of the air that was put into your GI tract during the procedure and reduce the bloating. If you had a lower endoscopy (such as a colonoscopy or flexible sigmoidoscopy) you may notice spotting of blood in your stool or on the toilet paper. If you underwent a bowel prep for your procedure, you may not have a normal bowel movement for a few days.  Please Note:  You might notice some irritation and congestion in your nose or some drainage.  This is from the oxygen used during your procedure.  There is no need for concern and it should clear up in a day or so.  SYMPTOMS TO REPORT IMMEDIATELY:   Following lower endoscopy (colonoscopy or flexible sigmoidoscopy):  Excessive amounts of blood in the stool  Significant tenderness or worsening of abdominal pains  Swelling of the abdomen that is new, acute  Fever of 100F or higher   Following upper endoscopy (EGD)  Vomiting of blood or coffee ground material  New chest pain or pain under the shoulder blades  Painful or persistently difficult swallowing  New shortness of breath  Fever of 100F or higher  Black, tarry-looking stools  For urgent or emergent issues, a gastroenterologist can be reached at any hour by calling [phone]. Do not use MyChart messaging for urgent concerns.    DIET:  We do recommend a small meal at first, but  then you may proceed to your regular diet.  Drink plenty of fluids but you should avoid alcoholic beverages for 24 hours.  ACTIVITY:  You should plan to take it easy for the rest of today and you should NOT DRIVE or use heavy machinery until tomorrow (because of the sedation medicines used during the test).    FOLLOW UP: Our staff will call the number listed on your records 48-72 hours following your procedure to check on you and address any questions or concerns that you may have regarding the information given to you following your procedure. If we do not reach you, we will leave a message.  We will attempt to reach you two times.  During this call, we will ask if you have developed any symptoms of COVID 19. If you develop any symptoms (ie: fever, flu-like symptoms, shortness of breath, cough etc.) before then, please call [phone].  If you test positive for Covid 19 in the 2 weeks post procedure, please call and report this information to us.    If any biopsies were taken you will be contacted by phone or by letter within the next 1-3 weeks.  Please call us at [phone] if you have not heard about the biopsies in 3 weeks.    SIGNATURES/CONFIDENTIALITY: You and/or your care partner have signed paperwork which will be entered into your electronic medical record.  These signatures attest to the fact that that the information above on   your After Visit Summary has been reviewed and is understood.  Full responsibility of the confidentiality of this discharge information lies with you and/or your care-partner. 

## 2021-03-03 NOTE — Progress Notes (Signed)
Medical history reviewed with no changes noted since PV. VS assessed by C.W 

## 2021-03-07 ENCOUNTER — Telehealth: Payer: Self-pay | Admitting: *Deleted

## 2021-03-07 NOTE — Telephone Encounter (Signed)
  Follow up Call-  Call back number 03/03/2021 02/24/2020  Post procedure Call Back phone  # [phone]  Permission to leave phone message Yes Yes  Some recent data might be hidden     Patient questions:  Do you have a fever, pain , or abdominal swelling? No. Pain Score  0 *  Have you tolerated food without any problems? Yes.    Have you been able to return to your normal activities? Yes.    Do you have any questions about your discharge instructions: Diet   No. Medications  No. Follow up visit  No.  Do you have questions or concerns about your Care? No.  Actions: * If pain score is 4 or above: No action needed, pain <4

## 2021-03-08 ENCOUNTER — Ambulatory Visit (INDEPENDENT_AMBULATORY_CARE_PROVIDER_SITE_OTHER): Payer: Medicare Other | Admitting: *Deleted

## 2021-03-08 DIAGNOSIS — R710 Precipitous drop in hematocrit: Secondary | ICD-10-CM | POA: Diagnosis not present

## 2021-03-08 DIAGNOSIS — M8949 Other hypertrophic osteoarthropathy, multiple sites: Secondary | ICD-10-CM | POA: Diagnosis not present

## 2021-03-08 DIAGNOSIS — I10 Essential (primary) hypertension: Secondary | ICD-10-CM | POA: Diagnosis not present

## 2021-03-08 DIAGNOSIS — M159 Polyosteoarthritis, unspecified: Secondary | ICD-10-CM

## 2021-03-08 DIAGNOSIS — K253 Acute gastric ulcer without hemorrhage or perforation: Secondary | ICD-10-CM

## 2021-03-08 NOTE — Patient Instructions (Signed)
Visit Information  PATIENT GOALS: Goals Addressed            This Visit's Progress   . Manage Pain-Osteoarthritis       Timeframe:  Long-Range Goal Priority:  High Start Date:                             Expected End Date:                       Follow Up Date 04/07/21   . plan exercise or activity when pain is best controlled . track what makes the pain worse and what makes it better . Tylenol or Tylenol Arthritis for pain relief . Do not take any NSAIDs (Ibuprofen, aleve, diclofenac, etc) . use ice or heat for pain relief  . keep all medical appointments . call PCP with any new or worsening symptoms   Why is this important?    Day-to-day life can be hard when you have joint pain.   Pain medicine is just one piece of the treatment puzzle. There are many things you can do to manage pain and stay strong.    Lifestyle changes like stopping smoking and eating foods with Vitamin D and calcium keeps your bones and muscles healthy. Your joints are better when supported by strong muscles.   You can try these action steps to help you manage your pain.     Notes:     . Upper GI Pain Resolved       Timeframe:  Short-Term Goal Priority:  High Start Date: 02/13/21                            Expected End Date:  04/13/21                   Follow-up: 04/07/21  . Call [facility] with any new symptoms [phone] . Follow-up with GI regarding results of biopsy  . Call RN care Manager [phone] as needed . Take protonix as directed twice daily . Limit fried, greasy, spicy foods . Increase intake of foods high in iron to help with anemia o Green leafy vegetables, liver, etc . Continue to avoid NSAIDs . Take Tylenol or Tylenol arthritis for pain       Patient verbalizes understanding of instructions provided today and agrees to view in [place].    Follow Up Plan:  . Telephone follow up appointment with care management team member scheduled for: 04/07/21 with RN Care  Manager . The patient has been provided with contact information for the care management team and has been advised to call with any health related questions or concerns.  . Follow up with PCP on 06/06/21  Chong Sicilian, BSN, RN-BC [facility] Direct Dial: [phone]

## 2021-03-08 NOTE — Chronic Care Management (AMB) (Signed)
Chronic Care Management   CCM RN Visit Note  03/08/2021 Name: Megan Chapman MRN: [id number] DOB: Oct 14, 1931  Subjective: Megan Chapman is a 87 y.o. year old female who is a primary care patient of Stacks, Cletus Gash, MD. The care management team was consulted for assistance with disease management and care coordination needs.    Engaged with patient by telephone for follow up visit in response to provider referral for case management and/or care coordination services.   Consent to Services:  The patient was given information about Chronic Care Management services, agreed to services, and gave verbal consent prior to initiation of services.  Please see initial visit note for detailed documentation.   Patient agreed to services and verbal consent obtained.   Assessment: Review of patient past medical history, allergies, medications, health status, including review of consultants reports, laboratory and other test data, was performed as part of comprehensive evaluation and provision of chronic care management services.   SDOH (Social Determinants of Health) assessments and interventions performed:    CCM Care Plan  Allergies  Allergen Reactions  . Amoxicillin   . Biaxin [Clarithromycin]   . Ciprofloxacin Nausea Only  . Codeine   . Nitrofurantoin Nausea And Vomiting and Nausea Only  . Sulfa Antibiotics     Outpatient Encounter Medications as of 03/08/2021  Medication Sig  . atorvastatin (LIPITOR) 40 MG tablet Take 1 tablet (40 mg total) by mouth daily.  . Cholecalciferol (VITAMIN D3) 1000 UNITS CAPS Take 2 capsules (2,000 Units total) by mouth daily.  [name] levothyroxine (SYNTHROID) 25 MCG tablet TAKE 1 TABLET DAILY BEFORE BREAKFAST  . Omega-3 Fatty Acids (FISH OIL) 1200 MG CAPS Take 2 capsules by mouth daily.  . pantoprazole (PROTONIX) 40 MG tablet Take 40 mg by mouth 2 (two) times daily. 30 minutes before lunch and 30 minutes before dinner  . ALPRAZolam (XANAX) 0.5 MG tablet Take 1 tablet (0.5  mg total) by mouth at bedtime as needed. for sleep (Patient not taking: No sig reported)   No facility-administered encounter medications on file as of 03/08/2021.    Patient Active Problem List   Diagnosis Date Noted  . Palpitations 07/15/2019  . Essential hypertension 07/15/2019  . Osteoarthritis 04/17/2017  . Hypothyroidism 10/11/2015  . Vitamin D deficiency 10/11/2015  . Hyperlipidemia 06/08/2013    Conditions to be addressed/monitored:HTN and osteoarthritis and PUD  Care Plan : RNCM: Osteoarthritis (Adult)  Updates made by Ilean China, RN since 03/08/2021 12:00 AM    Problem: Pain (Osteoarthritis)   Priority: High    Long-Range Goal: Manage Pain   This Visit's Progress: On track  Recent Progress: On track  Priority: High  Note:   Current Barriers:  . Chronic Disease Management support and education needs related to arthritis and chronic pain associated with arthritis  Nurse Case Manager Clinical Goal(s):  . patient will work with PCP to address needs related to medical management of osteoarthritis pain. . Patient will talk with RN Care Manager in reference to self-management of osteoarthritis pain.  Interventions:  . 1:1 collaboration with [name], MD regarding development and update of comprehensive plan of care as evidenced by provider attestation and co-signature . Inter-disciplinary care team collaboration (see longitudinal plan of care) . Evaluation of current treatment plan related to osteoarthritis and patient's adherence to plan as established by provider. . Chart reviewed including relevant office notes, lab results, and imaging reports . Discussed imaging reports and dx of arthritis . Reviewed and discussed medications o Diclofenac discontinued due  to elevated kidney functions and peptic ulcer o Taking Tylenol Arthritis with some relief . Discussed chiropractor visits o Seeing Dr Bethann Goo in [place] 2-3 times a month with a plan of decreasing to  once a month for maintenance  . Discussed effect of pain on mobility and daily activities . Encouraged patient to reach out to PCP with any new or worsening symptoms . Encouraged patient to reach out to [place] as needed  Patient Goals/Self-Care Activities Over the next 30 days, patient will: . plan exercise or activity when pain is best controlled . track what makes the pain worse and what makes it better . Tylenol or Tylenol Arthritis for pain relief . Do not take any NSAIDs (Ibuprofen, aleve, diclofenac, etc) . use ice or heat for pain relief  . keep all medical appointments . call PCP with any new or worsening symptoms    Care Plan : RNCM: Peptic Ulcer of Stomach  Updates made by Ilean China, RN since 03/08/2021 12:00 AM    Problem: Peptic Ulcer of Stomach   Priority: High    Goal: Upper GI Pain Resolved   Start Date: 02/14/2020  This Visit's Progress: On track  Recent Progress: On track  Priority: High  Note:   Current Barriers:  . Care Coordination needs related to upper GI pain in a patient with HTN, hypothyroidism, OA, HLD  Nurse Case Manager Clinical Goal(s):  [name] Patient will follow-up with gastroenterologist regarding results of biopsy from endoscopy . Patient will reach out to gastroenterologist with any new or worsening symptoms . Patient will demonstrate improved self-management of health condition by taking protonix twice daily as instructed  Interventions:  . 1:1 collaboration with [name], MD regarding development and update of comprehensive plan of care as evidenced by provider attestation and co-signature . Inter-disciplinary care team collaboration (see longitudinal plan of care) . Chart reviewed including relevant office notes, lab results o Discussed office visit and endoscopy with [facility] o Biopsy results for H. Pylori infection are pending from 03/03/21 o Discussed Hgb results from visit on 03/03/21 - Still a little low at 10.4 but  stable . Reviewed and discussed medications o Diclofenac discontinued at visit on 02/14/21 o Taking tylenol arthritis. Reassured patient that this does not contain an NSAID and should be safe to take. o Protonix 40mg  BID. 30 min before lunch and 30 min before dinner. Can't take in AM b/c taking synthroid.  - Compliant and no complaints - Discussed possibility of long-term use - Added to medication list . Discussed abdominal pain o Pain has resolved . Discussed diet o Resumed normal diet o Trying to incorporate more iron in her diet - Recommended fresh or frozen green leafy vegetables and liver . Call gastroenterologist [phone] with any new or worsening symptoms . Call [facility] [phone] as needed  Patient Goals/Self-Care Activities Over the next 30 days, patient will: . Call  GI with any new symptoms [phone] . Follow-up with GI regarding results of biopsy  . Call RN care Manager [phone] as needed . Take protonix as directed twice daily . Limit fried, greasy, spicy foods . Increase intake of foods high in iron to help with anemia o Green leafy vegetables, liver, etc . Continue to avoid NSAIDs . Take Tylenol or Tylenol arthritis for pain     Follow Up Plan:  . Telephone follow up appointment with care management team member scheduled for: 04/07/21 with RN Care Manager . The patient  has been provided with contact information for the care management team and has been advised to call with any health related questions or concerns.  . Follow up with PCP on 06/06/21  Chong Sicilian, BSN, RN-BC [facility] Direct Dial: [phone]

## 2021-03-23 DIAGNOSIS — M542 Cervicalgia: Secondary | ICD-10-CM | POA: Diagnosis not present

## 2021-03-23 DIAGNOSIS — M9901 Segmental and somatic dysfunction of cervical region: Secondary | ICD-10-CM | POA: Diagnosis not present

## 2021-03-23 DIAGNOSIS — M47812 Spondylosis without myelopathy or radiculopathy, cervical region: Secondary | ICD-10-CM | POA: Diagnosis not present

## 2021-03-31 ENCOUNTER — Ambulatory Visit (INDEPENDENT_AMBULATORY_CARE_PROVIDER_SITE_OTHER): Payer: Medicare Other | Admitting: *Deleted

## 2021-03-31 ENCOUNTER — Encounter: Payer: Self-pay | Admitting: *Deleted

## 2021-03-31 DIAGNOSIS — I10 Essential (primary) hypertension: Secondary | ICD-10-CM | POA: Diagnosis not present

## 2021-03-31 DIAGNOSIS — M159 Polyosteoarthritis, unspecified: Secondary | ICD-10-CM

## 2021-03-31 DIAGNOSIS — R710 Precipitous drop in hematocrit: Secondary | ICD-10-CM

## 2021-03-31 DIAGNOSIS — K253 Acute gastric ulcer without hemorrhage or perforation: Secondary | ICD-10-CM

## 2021-03-31 DIAGNOSIS — M8949 Other hypertrophic osteoarthropathy, multiple sites: Secondary | ICD-10-CM | POA: Diagnosis not present

## 2021-03-31 NOTE — Patient Instructions (Signed)
Visit Information  PATIENT GOALS: Goals Addressed            This Visit's Progress   . Anemia Managed   Not on track    Timeframe:  Short-Term Goal Priority:  High Start Date:                             Expected End Date:                       Follow-up: 05/19/21  . Have appointment with GI on 05/10/21 . Talk with GI about other possible causes of anemia . Discuss having repeat CBC and/or anemia panel with GI . Eat foods rich iron: green leafy vegetables, liver . Call RN Care Manager as needed [phone] . Call GI or PCP with any new or worsening symptoms    . Peptic Ulcer Healed   On track    Timeframe:  Short-Term Goal Priority:  High Start Date:                             Expected End Date:                       Follow-up: 05/24/21  . Call [facility] with any new symptoms [phone] . Follow-up with Dr Ardis Hughs on 05/10/21 . Talk with Dr Ardis Hughs about other potential causes of anemia and possibility of getting an anemia panel drawn . Call RN care Manager [phone] as needed . Take protonix as directed twice daily . Limit fried, greasy, spicy foods . Increase intake of foods high in iron to help with anemia o Green leafy vegetables, liver, etc . Continue to avoid NSAIDs . Take Tylenol or Tylenol arthritis for pain    . COMPLETED: Upper GI Pain Resolved       Timeframe:  Short-Term Goal Priority:  High Start Date: 02/13/21                            Expected End Date:  04/13/21                   Follow-up: 04/07/21  . Call [facility] with any new symptoms [phone] . Follow-up with GI regarding results of biopsy  . Call RN care Manager [phone] as needed . Take protonix as directed twice daily . Limit fried, greasy, spicy foods . Increase intake of foods high in iron to help with anemia o Green leafy vegetables, liver, etc . Continue to avoid NSAIDs . Take Tylenol or Tylenol arthritis for pain       Patient verbalizes understanding of instructions  provided today and agrees to view in [place].   Follow Up Plan:  . Telephone follow up appointment with care management team member scheduled for: 05/24/21 with RN Care Manager . The patient has been provided with contact information for the care management team and has been advised to call with any health related questions or concerns.  . Follow up with PCP on 06/06/21 . Follow up with Dr Ardis Hughs on 05/10/21  Chong Sicilian, BSN, RN-BC [place] [facility] Direct Dial: [phone]

## 2021-03-31 NOTE — Chronic Care Management (AMB) (Signed)
Chronic Care Management   CCM RN Visit Note  03/31/2021 Name: Megan Chapman MRN: [id number] DOB: 09/30/1931  Subjective: Megan Chapman is a 87 y.o. year old female who is a primary care patient of Stacks, Cletus Gash, MD. The care management team was consulted for assistance with disease management and care coordination needs.    Engaged with patient by telephone for follow up visit in response to provider referral for case management and/or care coordination services.   Consent to Services:  The patient was given information about Chronic Care Management services, agreed to services, and gave verbal consent prior to initiation of services.  Please see initial visit note for detailed documentation.   Patient agreed to services and verbal consent obtained.   Assessment: Review of patient past medical history, allergies, medications, health status, including review of consultants reports, laboratory and other test data, was performed as part of comprehensive evaluation and provision of chronic care management services.   SDOH (Social Determinants of Health) assessments and interventions performed:    CCM Care Plan  Allergies  Allergen Reactions  . Amoxicillin   . Biaxin [Clarithromycin]   . Ciprofloxacin Nausea Only  . Codeine   . Nitrofurantoin Nausea And Vomiting and Nausea Only  . Sulfa Antibiotics     Outpatient Encounter Medications as of 03/31/2021  Medication Sig  . ALPRAZolam (XANAX) 0.5 MG tablet Take 1 tablet (0.5 mg total) by mouth at bedtime as needed. for sleep (Patient not taking: No sig reported)  . atorvastatin (LIPITOR) 40 MG tablet Take 1 tablet (40 mg total) by mouth daily.  . Cholecalciferol (VITAMIN D3) 1000 UNITS CAPS Take 2 capsules (2,000 Units total) by mouth daily.  [name] levothyroxine (SYNTHROID) 25 MCG tablet TAKE 1 TABLET DAILY BEFORE BREAKFAST  . Omega-3 Fatty Acids (FISH OIL) 1200 MG CAPS Take 2 capsules by mouth daily.  . pantoprazole (PROTONIX) 40 MG tablet Take  40 mg by mouth 2 (two) times daily. 30 minutes before lunch and 30 minutes before dinner   No facility-administered encounter medications on file as of 03/31/2021.    Patient Active Problem List   Diagnosis Date Noted  . Palpitations 07/15/2019  . Essential hypertension 07/15/2019  . Osteoarthritis 04/17/2017  . Hypothyroidism 10/11/2015  . Vitamin D deficiency 10/11/2015  . Hyperlipidemia 06/08/2013    Conditions to be addressed/monitored: HTN, arthritis, anemia, peptic ulcer  Care Plan : RNCM: Peptic Ulcer of Stomach  Updates made by Ilean China, RN since 03/31/2021 12:00 AM    Problem: Peptic Ulcer of Stomach   Priority: High    Goal: Peptic Ulcer Healed   Start Date: 02/14/2020  This Visit's Progress: On track  Recent Progress: On track  Priority: High  Note:   Current Barriers:  . Care Coordination needs related to upper GI pain in a patient with HTN, hypothyroidism, OA, HLD  Nurse Case Manager Clinical Goal(s):  [name] Patient will follow-up with gastroenterologist regarding results of biopsy from endoscopy . Patient will reach out to gastroenterologist with any new or worsening symptoms . Patient will demonstrate improved self-management of health condition by taking protonix twice daily as instructed  Interventions:  . 1:1 collaboration with [name], MD regarding development and update of comprehensive plan of care as evidenced by provider attestation and co-signature . Inter-disciplinary care team collaboration (see longitudinal plan of care) . Chart reviewed including relevant office notes, lab results o Biopsy results were negative for cancer or infection o Discussed Hgb results from visit on 03/03/21 - Still  a little low at 10.4 but stable . Provided information about upcoming appointment with Dr Earlie Raveling for f/u on 05/10/21 at 3:20 o He will review biopsy results and likely discuss repeating endoscopy to check for ulcer resolution o Recommended she talk with him  about doing an anemia panel since cause of slightly decreased hgb and RBCs are unknown . Reviewed and discussed medications o Diclofenac discontinued at visit on 02/14/21 o Taking tylenol arthritis. Reassured patient that this does not contain an NSAID and should be safe to take. o Protonix 40mg  BID. 30 min before lunch and 30 min before dinner. Can't take in AM b/c taking synthroid.  - Compliant and no complaints - Discussed possibility of long-term use vs discontinuation after ulcer has healed . Discussed abdominal pain o Pain has resolved . Discussed diet o Resumed normal diet o Trying to incorporate more iron in her diet - Recommended fresh or frozen green leafy vegetables and liver . Call gastroenterologist [phone] with any new or worsening symptoms . Call [place] [phone] as needed  Patient Goals/Self-Care Activities Over the next 30 days, patient will: . Call [facility] with any new symptoms [phone] . Follow-up with Dr Ardis Hughs on 05/10/21 . Talk with Dr Ardis Hughs about other potential causes of anemia and possibility of getting an anemia panel drawn . Call RN care Manager [phone] as needed . Take protonix as directed twice daily . Limit fried, greasy, spicy foods . Increase intake of foods high in iron to help with anemia o Green leafy vegetables, liver, etc . Continue to avoid NSAIDs . Take Tylenol or Tylenol arthritis for pain    Care Plan : RNCM: Anemia  Updates made by Ilean China, RN since 03/31/2021 12:00 AM    Problem: Anemia   Priority: Medium    Goal: Anemia Cause Identified   This Visit's Progress: Not on track  Priority: High  Note:   Objective: Lab Results  Component Value Date   WBC 4.1 02/22/2021   HGB 10.5 (L) 02/22/2021   HCT 32.6 (L) 02/22/2021   MCV 91 02/22/2021   PLT 286 02/22/2021   Current Barriers:  . Chronic Disease Management support and education needs related to anemia in a patient with peptic ulcer and  hypertension . Unknown cause for anemia . Has not had an anemia panel drawn  Nurse Case Manager Clinical Goal(s):  . patient will work with PCP and GI to address needs related to medical cause of anemia . patient will meet with RN Care Manager to address self-management of anemia  Interventions:  . 1:1 collaboration with [name], MD regarding development and update of comprehensive plan of care as evidenced by provider attestation and co-signature . Inter-disciplinary care team collaboration (see longitudinal plan of care) . Evaluation of current treatment plan related to anemia and patient's adherence to plan as established by provider. . Chart reviewed including relevant office notes and lab results . Discussed two most recent Hgb levels o Stable and almost normal but repeat would be a good idea o Patient is anxious about what the level may be now and the cause . Discussed upcoming appointment with Dr Ardis Hughs on 05/10/21 o Recommended she talk with him about other potential causes of anemia and low RBCs since peptic ulcer is not bleeding . Discussed diet o Patient is incorporating more iron rich foods in her diet . Encouraged patient to reach out to PCP or GI with any new or worsening symptoms . Encouraged patient to  reach out to [facility] as needed  Patient Goals/Self-Care Activities Over the next 45 days, patient will: . Have appointment with GI on 05/10/21 . Talk with GI about other possible causes of anemia . Discuss having repeat CBC and/or anemia panel with GI . Eat foods rich iron: green leafy vegetables, liver . Call RN Care Manager as needed [phone] . Call GI or PCP with any new or worsening symptoms     Follow Up Plan:  . Telephone follow up appointment with care management team member scheduled for: 05/24/21 with RN Care Manager . The patient has been provided with contact information for the care management team and has been advised to call with any health related  questions or concerns.  . Follow up with PCP on 06/06/21 . Follow up with Dr Ardis Hughs on 05/10/21  Chong Sicilian, BSN, RN-BC [employer] [facility] [place] [facility] Direct Dial: [phone]

## 2021-04-05 ENCOUNTER — Other Ambulatory Visit: Payer: Self-pay | Admitting: Family Medicine

## 2021-04-07 ENCOUNTER — Telehealth: Payer: Medicare Other

## 2021-04-13 DIAGNOSIS — M47812 Spondylosis without myelopathy or radiculopathy, cervical region: Secondary | ICD-10-CM | POA: Diagnosis not present

## 2021-04-13 DIAGNOSIS — M542 Cervicalgia: Secondary | ICD-10-CM | POA: Diagnosis not present

## 2021-04-13 DIAGNOSIS — M9901 Segmental and somatic dysfunction of cervical region: Secondary | ICD-10-CM | POA: Diagnosis not present

## 2021-05-04 DIAGNOSIS — M47812 Spondylosis without myelopathy or radiculopathy, cervical region: Secondary | ICD-10-CM | POA: Diagnosis not present

## 2021-05-04 DIAGNOSIS — M542 Cervicalgia: Secondary | ICD-10-CM | POA: Diagnosis not present

## 2021-05-04 DIAGNOSIS — M9901 Segmental and somatic dysfunction of cervical region: Secondary | ICD-10-CM | POA: Diagnosis not present

## 2021-05-05 DIAGNOSIS — M9901 Segmental and somatic dysfunction of cervical region: Secondary | ICD-10-CM | POA: Diagnosis not present

## 2021-05-05 DIAGNOSIS — M542 Cervicalgia: Secondary | ICD-10-CM | POA: Diagnosis not present

## 2021-05-05 DIAGNOSIS — M47812 Spondylosis without myelopathy or radiculopathy, cervical region: Secondary | ICD-10-CM | POA: Diagnosis not present

## 2021-05-09 DIAGNOSIS — M47812 Spondylosis without myelopathy or radiculopathy, cervical region: Secondary | ICD-10-CM | POA: Diagnosis not present

## 2021-05-09 DIAGNOSIS — M542 Cervicalgia: Secondary | ICD-10-CM | POA: Diagnosis not present

## 2021-05-09 DIAGNOSIS — M9901 Segmental and somatic dysfunction of cervical region: Secondary | ICD-10-CM | POA: Diagnosis not present

## 2021-05-10 ENCOUNTER — Ambulatory Visit: Payer: Medicare Other | Admitting: Gastroenterology

## 2021-05-12 ENCOUNTER — Telehealth: Payer: Self-pay

## 2021-05-12 DIAGNOSIS — M9901 Segmental and somatic dysfunction of cervical region: Secondary | ICD-10-CM | POA: Diagnosis not present

## 2021-05-12 DIAGNOSIS — M47812 Spondylosis without myelopathy or radiculopathy, cervical region: Secondary | ICD-10-CM | POA: Diagnosis not present

## 2021-05-12 DIAGNOSIS — M542 Cervicalgia: Secondary | ICD-10-CM | POA: Diagnosis not present

## 2021-05-12 NOTE — Telephone Encounter (Signed)
DISK MADE AND PATIENT HAS PICKED UP

## 2021-05-15 DIAGNOSIS — M533 Sacrococcygeal disorders, not elsewhere classified: Secondary | ICD-10-CM | POA: Diagnosis not present

## 2021-05-15 DIAGNOSIS — Z888 Allergy status to other drugs, medicaments and biological substances status: Secondary | ICD-10-CM | POA: Diagnosis not present

## 2021-05-15 DIAGNOSIS — Z882 Allergy status to sulfonamides status: Secondary | ICD-10-CM | POA: Diagnosis not present

## 2021-05-15 DIAGNOSIS — Z885 Allergy status to narcotic agent status: Secondary | ICD-10-CM | POA: Diagnosis not present

## 2021-05-15 DIAGNOSIS — Z881 Allergy status to other antibiotic agents status: Secondary | ICD-10-CM | POA: Diagnosis not present

## 2021-05-17 DIAGNOSIS — M47812 Spondylosis without myelopathy or radiculopathy, cervical region: Secondary | ICD-10-CM | POA: Diagnosis not present

## 2021-05-17 DIAGNOSIS — M9901 Segmental and somatic dysfunction of cervical region: Secondary | ICD-10-CM | POA: Diagnosis not present

## 2021-05-17 DIAGNOSIS — M542 Cervicalgia: Secondary | ICD-10-CM | POA: Diagnosis not present

## 2021-05-24 ENCOUNTER — Ambulatory Visit (INDEPENDENT_AMBULATORY_CARE_PROVIDER_SITE_OTHER): Payer: Medicare Other | Admitting: *Deleted

## 2021-05-24 DIAGNOSIS — M47812 Spondylosis without myelopathy or radiculopathy, cervical region: Secondary | ICD-10-CM | POA: Diagnosis not present

## 2021-05-24 DIAGNOSIS — I10 Essential (primary) hypertension: Secondary | ICD-10-CM

## 2021-05-24 DIAGNOSIS — M542 Cervicalgia: Secondary | ICD-10-CM | POA: Diagnosis not present

## 2021-05-24 DIAGNOSIS — M9901 Segmental and somatic dysfunction of cervical region: Secondary | ICD-10-CM | POA: Diagnosis not present

## 2021-05-24 DIAGNOSIS — M159 Polyosteoarthritis, unspecified: Secondary | ICD-10-CM

## 2021-05-24 DIAGNOSIS — R710 Precipitous drop in hematocrit: Secondary | ICD-10-CM

## 2021-05-24 DIAGNOSIS — M8949 Other hypertrophic osteoarthropathy, multiple sites: Secondary | ICD-10-CM | POA: Diagnosis not present

## 2021-05-24 NOTE — Chronic Care Management (AMB) (Signed)
Chronic Care Management   CCM RN Visit Note  05/24/2021 Name: Megan Chapman MRN: [id number] DOB: 1931-07-11  Subjective: Megan Chapman is a 87 y.o. year old female who is a primary care patient of Stacks, Cletus Gash, MD. The care management team was consulted for assistance with disease management and care coordination needs.    Engaged with patient by telephone for follow up visit in response to provider referral for case management and/or care coordination services.   Consent to Services:  The patient was given information about Chronic Care Management services, agreed to services, and gave verbal consent prior to initiation of services.  Please see initial visit note for detailed documentation.   Patient agreed to services and verbal consent obtained.   Assessment: Review of patient past medical history, allergies, medications, health status, including review of consultants reports, laboratory and other test data, was performed as part of comprehensive evaluation and provision of chronic care management services.   SDOH (Social Determinants of Health) assessments and interventions performed:    CCM Care Plan  Allergies  Allergen Reactions  . Amoxicillin   . Biaxin [Clarithromycin]   . Ciprofloxacin Nausea Only  . Codeine   . Nitrofurantoin Nausea And Vomiting and Nausea Only  . Sulfa Antibiotics     Outpatient Encounter Medications as of 05/24/2021  Medication Sig  . ALPRAZolam (XANAX) 0.5 MG tablet Take 1 tablet (0.5 mg total) by mouth at bedtime as needed. for sleep (Patient not taking: No sig reported)  . atorvastatin (LIPITOR) 40 MG tablet Take 1 tablet (40 mg total) by mouth daily.  . Cholecalciferol (VITAMIN D3) 1000 UNITS CAPS Take 2 capsules (2,000 Units total) by mouth daily.  [name] levothyroxine (SYNTHROID) 25 MCG tablet TAKE 1 TABLET DAILY BEFORE BREAKFAST  . Omega-3 Fatty Acids (FISH OIL) 1200 MG CAPS Take 2 capsules by mouth daily.  . pantoprazole (PROTONIX) 40 MG tablet  Take 40 mg by mouth 2 (two) times daily. 30 minutes before lunch and 30 minutes before dinner   No facility-administered encounter medications on file as of 05/24/2021.    Patient Active Problem List   Diagnosis Date Noted  . Palpitations 07/15/2019  . Essential hypertension 07/15/2019  . Osteoarthritis 04/17/2017  . Hypothyroidism 10/11/2015  . Vitamin D deficiency 10/11/2015  . Hyperlipidemia 06/08/2013    Conditions to be addressed/monitored: HTN, hypothyroidism, OA, HLD, GERD, peptic ulcer of stomach  Care Plan : RNCM: Osteoarthritis (Adult)  Updates made by Ilean China, RN since 05/24/2021 12:00 AM    Problem: Pain (Osteoarthritis)   Priority: High    Long-Range Goal: Manage Pain   This Visit's Progress: On track  Recent Progress: On track  Priority: High  Note:   Current Barriers:  . Chronic Disease Management support and education needs related to arthritis and chronic pain associated with arthritis  Nurse Case Manager Clinical Goal(s):  . patient will work with orthopedic specialist to address needs related to medical management pain. . Patient will talk with RN Care Manager in reference to self-management of osteoarthritis pain.  Interventions:  . 1:1 collaboration with [name], MD regarding development and update of comprehensive plan of care as evidenced by provider attestation and co-signature . Inter-disciplinary care team collaboration (see longitudinal plan of care) . Evaluation of current treatment plan related to osteoarthritis and patient's adherence to plan as established by provider. . Chart reviewed including relevant office notes, lab results, and imaging reports . Reviewed and discussed medications o Taking Tylenol Arthritis with some relief .  Discussed visits with chiropractor and orthopedic specialist at [facility] . Discussed effect of pain on mobility and daily activities . Reviewed upcoming appointments with PCP and orthopedic  specialist  . Discussed family/social support . Discussed aggravating and alleviating factors . Therapeutic listening utilized regarding effect of pain on quality of life . Encouraged patient to reach out to PCP with any new or worsening symptoms . Encouraged patient to reach out to [place] as needed  Patient Goals/Self-Care Activities Over the next 30 days, patient will: . plan exercise or activity when pain is best controlled . track what makes the pain worse and what makes it better . Tylenol or Tylenol Arthritis for pain relief . Do not take any NSAIDs (Ibuprofen, aleve, diclofenac, etc) . use ice or heat for pain relief  . keep all medical appointments . call PCP with any new or worsening symptoms    Care Plan : RNCM: Peptic Ulcer of Stomach  Updates made by Ilean China, RN since 05/24/2021 12:00 AM  Completed 05/24/2021  Problem: Peptic Ulcer of Stomach Resolved 05/24/2021  Priority: High    Goal: Peptic Ulcer Healed Completed 05/24/2021  Start Date: 02/14/2020  Recent Progress: On track  Priority: High  Note:   Current Barriers:  . Care Coordination needs related to upper GI pain in a patient with HTN, hypothyroidism, OA, HLD  Nurse Case Manager Clinical Goal(s):  [name] Patient will follow-up with gastroenterologist regarding results of biopsy from endoscopy . Patient will reach out to gastroenterologist with any new or worsening symptoms . Patient will demonstrate improved self-management of health condition by taking protonix twice daily as instructed  Interventions:  . 1:1 collaboration with [name], MD regarding development and update of comprehensive plan of care as evidenced by provider attestation and co-signature . Inter-disciplinary care team collaboration (see longitudinal plan of care) . Chart reviewed including relevant office notes, lab results o Biopsy results were negative for cancer or infection o Discussed Hgb results from visit on  03/03/21 - Still a little low at 10.4 but stable . Provided information about upcoming appointment with Dr Earlie Raveling for f/u on 05/10/21 at 3:20 o He will review biopsy results and likely discuss repeating endoscopy to check for ulcer resolution o Recommended she talk with him about doing an anemia panel since cause of slightly decreased hgb and RBCs are unknown . Reviewed and discussed medications o Diclofenac discontinued at visit on 02/14/21 o Taking tylenol arthritis. Reassured patient that this does not contain an NSAID and should be safe to take. o Protonix 40mg  BID. 30 min before lunch and 30 min before dinner. Can't take in AM b/c taking synthroid.  - Compliant and no complaints - Discussed possibility of long-term use vs discontinuation after ulcer has healed . Discussed abdominal pain o Pain has resolved . Discussed diet o Resumed normal diet o Trying to incorporate more iron in her diet - Recommended fresh or frozen green leafy vegetables and liver . Call gastroenterologist [phone] with any new or worsening symptoms . Call [place] [phone] as needed  Patient Goals/Self-Care Activities Over the next 30 days, patient will: . Call [facility] with any new symptoms [phone] . Follow-up with Dr Ardis Hughs on 05/10/21 . Talk with Dr Ardis Hughs about other potential causes of anemia and possibility of getting an anemia panel drawn . Call RN care Manager [phone] as needed . Take protonix as directed twice daily . Limit fried, greasy, spicy foods . Increase intake of  foods high in iron to help with anemia o Green leafy vegetables, liver, etc . Continue to avoid NSAIDs . Take Tylenol or Tylenol arthritis for pain    Care Plan : RNCM: Anemia  Updates made by Ilean China, RN since 05/24/2021 12:00 AM    Problem: Anemia   Priority: Medium    Goal: Improvement in Anemia   Start Date: 05/24/2021  This Visit's Progress: On track  Priority: Medium  Note:    Objective: Lab Results  Component Value Date   WBC 4.1 02/22/2021   HGB 10.5 (L) 02/22/2021   HCT 32.6 (L) 02/22/2021   MCV 91 02/22/2021   PLT 286 02/22/2021   Current Barriers:  . Unknown cause for anemia . Hx of peptic ulcer disease . Comorbidities: arthritis,   Nurse Case Manager Clinical Goal(s):  [name] Patient will meet with PCP to address medical management of anemia . Patient will demonstrate ongoing self-health management by eating foods that are rich in iron  Interventions:  . 1:1 collaboration with [name], MD regarding development and update of comprehensive plan of care as evidenced by provider attestation and co-signature . Inter-disciplinary care team collaboration (see longitudinal plan of care) . Evaluation of current treatment plan related to anemia and patient's adherence to plan as established by provider. . Chart reviewed including relevant office notes and lab results . Discussed two most recent Hgb levels o Stable and almost normal  . Discussed likelihood of peptic ulcer disease but normal GI workup . Encouraged to eat a diet with iron rich foods . Assessed for symptoms of anemia . Reviewed upcoming appointments and need to repeat CBC at next PCP appt . Encouraged patient to reach out to PCP with any new or worsening symptoms . Encouraged patient to reach out to [facility] as needed  Patient Goals/Self-Care Activities Over the next 45 days, patient will: . Eat foods rich iron: green leafy vegetables, liver . Call RN Care Manager as needed [phone] . Call PCP with any new or worsening symptoms . Keep appointment with Dr Livia Snellen on 06/06/21 . Repeat CBC at PCP appt    Follow Up Plan:  . Telephone follow up appointment with care management team member scheduled for: 06/26/21 with RN Care Manager . The patient has been provided with contact information for the care management team and has been advised to call with any health related questions or concerns.   . Follow up with PCP on 06/06/21 . Follow-up with orthopedic specialist on 06/23/21  Chong Sicilian, BSN, RN-BC [place] [facility] [place] Management Direct Dial: [phone]

## 2021-05-24 NOTE — Patient Instructions (Signed)
Visit Information  PATIENT GOALS: Goals Addressed            This Visit's Progress   . Anemia Managed       Timeframe:  Short-Term Goal Priority:  High Start Date:                             Expected End Date:                       Follow-up: 06/26/21  . Eat foods rich iron: green leafy vegetables, liver . Call RN Care Manager as needed [phone] . Call PCP with any new or worsening symptoms . Keep appointment with Dr Livia Snellen on 06/06/21 . Repeat CBC at PCP appt       Patient verbalizes understanding of instructions provided today and agrees to view in [place].   Follow Up Plan:  . Telephone follow up appointment with care management team member scheduled for: 06/26/21 with RN Care Manager . The patient has been provided with contact information for the care management team and has been advised to call with any health related questions or concerns.  . Follow up with PCP on 06/06/21 . Follow-up with orthopedic specialist on 06/23/21  Chong Sicilian, BSN, RN-BC [place] [facility] [place] Management Direct Dial: [phone]

## 2021-05-31 DIAGNOSIS — M9901 Segmental and somatic dysfunction of cervical region: Secondary | ICD-10-CM | POA: Diagnosis not present

## 2021-05-31 DIAGNOSIS — M542 Cervicalgia: Secondary | ICD-10-CM | POA: Diagnosis not present

## 2021-05-31 DIAGNOSIS — M47812 Spondylosis without myelopathy or radiculopathy, cervical region: Secondary | ICD-10-CM | POA: Diagnosis not present

## 2021-06-06 ENCOUNTER — Other Ambulatory Visit: Payer: Self-pay

## 2021-06-06 ENCOUNTER — Ambulatory Visit (INDEPENDENT_AMBULATORY_CARE_PROVIDER_SITE_OTHER): Payer: Medicare Other | Admitting: Family Medicine

## 2021-06-06 ENCOUNTER — Encounter: Payer: Self-pay | Admitting: Family Medicine

## 2021-06-06 VITALS — BP 156/74 | HR 67 | Temp 97.2°F | Ht 62.0 in | Wt 142.6 lb

## 2021-06-06 DIAGNOSIS — M159 Polyosteoarthritis, unspecified: Secondary | ICD-10-CM

## 2021-06-06 DIAGNOSIS — F419 Anxiety disorder, unspecified: Secondary | ICD-10-CM | POA: Diagnosis not present

## 2021-06-06 DIAGNOSIS — E038 Other specified hypothyroidism: Secondary | ICD-10-CM

## 2021-06-06 DIAGNOSIS — E782 Mixed hyperlipidemia: Secondary | ICD-10-CM

## 2021-06-06 DIAGNOSIS — M8949 Other hypertrophic osteoarthropathy, multiple sites: Secondary | ICD-10-CM

## 2021-06-06 DIAGNOSIS — I10 Essential (primary) hypertension: Secondary | ICD-10-CM

## 2021-06-06 DIAGNOSIS — E559 Vitamin D deficiency, unspecified: Secondary | ICD-10-CM | POA: Diagnosis not present

## 2021-06-06 MED ORDER — DULOXETINE HCL 30 MG PO CPEP: ORAL_CAPSULE | ORAL | 0 refills | Status: DC

## 2021-06-06 MED ORDER — ALPRAZOLAM 0.5 MG PO TABS: 0.5000 mg | ORAL_TABLET | Freq: Every evening | ORAL | 1 refills | Status: DC | PRN

## 2021-06-06 NOTE — Progress Notes (Signed)
Subjective:  Patient ID: Megan Chapman, female    DOB: 04-21-1931  Age: 87 y.o. MRN: [id number]  CC: Medical Management of Chronic Issues   HPI Megan Chapman presents for xanax refills. Needs for anxiety. Rolls and tumbles. Can't rest. Sometimes takes two hours to get to sleep. Legs and back hurt. Back about to go out. Worries about Darryl, her son. Lays in bed thinking about everything.   Arthritis in back hips and right hand causes pain. Limits activities.  Takes Tylenol Arthritis. HadGI bleed that requires her to avoid NSAIDS.    GAD 7 : Generalized Anxiety Score 06/06/2021 05/31/2020 04/10/2016  Nervous, Anxious, on Edge _0 Control/stop worrying 0 1 1  Worry too much - different things _1 Trouble relaxing 1 1 0  Restless 0 1 1  Easily annoyed or irritable 0 0 1  Afraid - awful might happen _2 Total GAD 7 Score _3 Anxiety Difficulty Somewhat difficult Somewhat difficult Not difficult at all      Depression screen [facility] 2/9 06/06/2021 02/14/2021 12/06/2020  Decreased Interest 0 0 0  Down, Depressed, Hopeless 0 0 0  PHQ - 2 Score 0 0 0  Altered sleeping 1 - -  Tired, decreased energy 0 - -  Change in appetite 0 - -  Feeling bad or failure about yourself  0 - -  Trouble concentrating 0 - -  Moving slowly or fidgety/restless 1 - -  Suicidal thoughts 0 - -  PHQ-9 Score 2 - -  Difficult doing work/chores Not difficult at all - -  Some recent data might be hidden    History Megan Chapman has a past medical history of Allergy, Anxiety, Arthritis, Asthma, Basal cell carcinoma, Cataract, GERD (gastroesophageal reflux disease), HTN (hypertension), Hyperlipidemia, Migraine, Palpitations, and Thyroid disease.   She has a past surgical history that includes Partial knee arthroplasty (Left); Abdominal hysterectomy; Meniscus repair (Right); Excision basal cell carcinoma (2017); Cataract extraction (Bilateral); Appendectomy; Tonsillectomy; and Colonoscopy.   Her family history includes  COPD in her son; Cancer in her brother; Cancer - Colon in her mother; Colon cancer in her mother; Down syndrome in her daughter; Migraines in her brother and son; Prostate cancer in her father; Stroke in her brother.She reports that she has never smoked. She has never used smokeless tobacco. She reports that she does not drink alcohol and does not use drugs.    ROS Review of Systems  Constitutional: Negative.   HENT: Negative.   Eyes: Negative for visual disturbance.  Respiratory: Negative for shortness of breath.   Cardiovascular: Negative for chest pain.  Gastrointestinal: Negative for abdominal pain.  Musculoskeletal: Negative for arthralgias.    Objective:  BP (!) 156/74   Pulse 67   Temp (!) 97.2 F (36.2 C)   Ht _4  (1.575 m)   Wt 142 lb 9.6 oz (64.7 kg)   SpO2 100%   BMI 26.08 kg/m   BP Readings from Last 3 Encounters:  06/06/21 (!) 156/74  03/03/21 129/61  03/01/21 126/88    Wt Readings from Last 3 Encounters:  06/06/21 142 lb 9.6 oz (64.7 kg)  03/03/21 143 lb (64.9 kg)  03/01/21 143 lb 6.4 oz (65 kg)     Physical Exam Constitutional:      General: She is not in acute distress.    Appearance: She is well-developed.  HENT:     Head: Normocephalic and atraumatic.  Eyes:  Conjunctiva/sclera: Conjunctivae normal.     Pupils: Pupils are equal, round, and reactive to light.  Neck:     Thyroid: No thyromegaly.  Cardiovascular:     Rate and Rhythm: Normal rate and regular rhythm.     Heart sounds: Normal heart sounds. No murmur heard.   Pulmonary:     Effort: Pulmonary effort is normal. No respiratory distress.     Breath sounds: Normal breath sounds. No wheezing or rales.  Abdominal:     General: Bowel sounds are normal. There is no distension.     Palpations: Abdomen is soft.     Tenderness: There is no abdominal tenderness.  Musculoskeletal:        General: Normal range of motion.     Cervical back: Normal range of motion and neck supple.   Lymphadenopathy:     Cervical: No cervical adenopathy.  Skin:    General: Skin is warm and dry.  Neurological:     Mental Status: She is alert and oriented to person, place, and time.  Psychiatric:        Behavior: Behavior normal.        Thought Content: Thought content normal.        Judgment: Judgment normal.       Assessment & Plan:   Megan Chapman was seen today for medical management of chronic issues.  Diagnoses and all orders for this visit:  Essential hypertension -     CBC with Differential/Platelet -     CMP14+EGFR  Mixed hyperlipidemia -     CMP14+EGFR -     Lipid panel  Other specified hypothyroidism -     CBC with Differential/Platelet -     CMP14+EGFR -     Lipid panel -     TSH + free T4  Vitamin D deficiency  Primary osteoarthritis involving multiple joints -     CBC with Differential/Platelet -     CMP14+EGFR -     DULoxetine (CYMBALTA) 30 MG capsule; One daily for arthritis  for one week then two daily. Take with a full stomach at suppertime  Anxiety -     ALPRAZolam (XANAX) 0.5 MG tablet; Take 1 tablet (0.5 mg total) by mouth at bedtime as needed. for sleep -     DULoxetine (CYMBALTA) 30 MG capsule; One daily for arthritis  for one week then two daily. Take with a full stomach at suppertime       I am having Megan Chapman start on DULoxetine. I am also having her maintain her Vitamin D3, Fish Oil, atorvastatin, pantoprazole, levothyroxine, and ALPRAZolam.  Allergies as of 06/06/2021      Reactions   Amoxicillin    Biaxin [clarithromycin]    Ciprofloxacin Nausea Only   Codeine    Nitrofurantoin Nausea And Vomiting, Nausea Only   Sulfa Antibiotics       Medication List       Accurate as of June 06, 2021 11:59 PM. If you have any questions, ask your nurse or doctor.        ALPRAZolam 0.5 MG tablet Commonly known as: XANAX Take 1 tablet (0.5 mg total) by mouth at bedtime as needed. for sleep   atorvastatin 40 MG tablet Commonly known as:  LIPITOR Take 1 tablet (40 mg total) by mouth daily.   DULoxetine 30 MG capsule Commonly known as: Cymbalta One daily for arthritis  for one week then two daily. Take with a full stomach at suppertime Started by: [name], MD  Fish Oil 1200 MG Caps Take 2 capsules by mouth daily.   levothyroxine 25 MCG tablet Commonly known as: SYNTHROID TAKE 1 TABLET DAILY BEFORE BREAKFAST   pantoprazole 40 MG tablet Commonly known as: PROTONIX Take 40 mg by mouth 2 (two) times daily. 30 minutes before lunch and 30 minutes before dinner   Vitamin D3 25 MCG (1000 UT) Caps Take 2 capsules (2,000 Units total) by mouth daily.        Follow-up: No follow-ups on file.  [name], M.D.

## 2021-06-07 ENCOUNTER — Encounter: Payer: Self-pay | Admitting: Family Medicine

## 2021-06-07 LAB — CBC WITH DIFFERENTIAL/PLATELET
Basophils Absolute: 0 10*3/uL (ref 0.0–0.2)
Basos: 0 %
EOS (ABSOLUTE): 0.1 10*3/uL (ref 0.0–0.4)
Eos: 2 %
Hematocrit: 36.5 % (ref 34.0–46.6)
Hemoglobin: 11.9 g/dL (ref 11.1–15.9)
Immature Grans (Abs): 0 10*3/uL (ref 0.0–0.1)
Immature Granulocytes: 0 %
Lymphocytes Absolute: 1.2 10*3/uL (ref 0.7–3.1)
Lymphs: 39 %
MCH: 28.3 pg (ref 26.6–33.0)
MCHC: 32.6 g/dL (ref 31.5–35.7)
MCV: 87 fL (ref 79–97)
Monocytes Absolute: 0.6 10*3/uL (ref 0.1–0.9)
Monocytes: 19 %
Neutrophils Absolute: 1.3 10*3/uL — ABNORMAL LOW (ref 1.4–7.0)
Neutrophils: 40 %
Platelets: 213 10*3/uL (ref 150–450)
RBC: 4.21 x10E6/uL (ref 3.77–5.28)
RDW: 14.2 % (ref 11.7–15.4)
WBC: 3.2 10*3/uL — ABNORMAL LOW (ref 3.4–10.8)

## 2021-06-07 LAB — CMP14+EGFR
ALT: 13 IU/L (ref 0–32)
AST: 15 IU/L (ref 0–40)
Albumin/Globulin Ratio: 1.6 (ref 1.2–2.2)
Albumin: 4.5 g/dL (ref 3.6–4.6)
Alkaline Phosphatase: 57 IU/L (ref 44–121)
BUN/Creatinine Ratio: 18 (ref 12–28)
BUN: 20 mg/dL (ref 8–27)
Bilirubin Total: 0.6 mg/dL (ref 0.0–1.2)
CO2: 24 mmol/L (ref 20–29)
Calcium: 10.2 mg/dL (ref 8.7–10.3)
Chloride: 101 mmol/L (ref 96–106)
Creatinine, Ser: 1.14 mg/dL — ABNORMAL HIGH (ref 0.57–1.00)
Globulin, Total: 2.9 g/dL (ref 1.5–4.5)
Glucose: 86 mg/dL (ref 65–99)
Potassium: 4.4 mmol/L (ref 3.5–5.2)
Sodium: 138 mmol/L (ref 134–144)
Total Protein: 7.4 g/dL (ref 6.0–8.5)
eGFR: 47 mL/min/{1.73_m2} — ABNORMAL LOW (ref >59)

## 2021-06-07 LAB — TSH+FREE T4
Free T4: 1.24 ng/dL (ref 0.82–1.77)
TSH: 4 u[IU]/mL (ref 0.450–4.500)

## 2021-06-07 LAB — LIPID PANEL
Chol/HDL Ratio: 3.8 ratio (ref 0.0–4.4)
Cholesterol, Total: 200 mg/dL — ABNORMAL HIGH (ref 100–199)
HDL: 52 mg/dL (ref >39)
LDL Chol Calc (NIH): 118 mg/dL — ABNORMAL HIGH (ref 0–99)
Triglycerides: 172 mg/dL — ABNORMAL HIGH (ref 0–149)
VLDL Cholesterol Cal: 30 mg/dL (ref 5–40)

## 2021-06-07 NOTE — Progress Notes (Signed)
Hello Obelia,  Your lab result is normal and/or stable.Some minor variations that are not significant are commonly marked abnormal, but do not represent any medical problem for you.  Best regards, Harold Moncus, M.D.

## 2021-06-14 DIAGNOSIS — M542 Cervicalgia: Secondary | ICD-10-CM | POA: Diagnosis not present

## 2021-06-14 DIAGNOSIS — M47812 Spondylosis without myelopathy or radiculopathy, cervical region: Secondary | ICD-10-CM | POA: Diagnosis not present

## 2021-06-14 DIAGNOSIS — M9901 Segmental and somatic dysfunction of cervical region: Secondary | ICD-10-CM | POA: Diagnosis not present

## 2021-06-23 DIAGNOSIS — Z882 Allergy status to sulfonamides status: Secondary | ICD-10-CM | POA: Diagnosis not present

## 2021-06-23 DIAGNOSIS — G8929 Other chronic pain: Secondary | ICD-10-CM | POA: Diagnosis not present

## 2021-06-23 DIAGNOSIS — M545 Low back pain, unspecified: Secondary | ICD-10-CM | POA: Diagnosis not present

## 2021-06-23 DIAGNOSIS — Z885 Allergy status to narcotic agent status: Secondary | ICD-10-CM | POA: Diagnosis not present

## 2021-06-23 DIAGNOSIS — Z881 Allergy status to other antibiotic agents status: Secondary | ICD-10-CM | POA: Diagnosis not present

## 2021-06-23 DIAGNOSIS — M461 Sacroiliitis, not elsewhere classified: Secondary | ICD-10-CM | POA: Diagnosis not present

## 2021-06-26 ENCOUNTER — Ambulatory Visit (INDEPENDENT_AMBULATORY_CARE_PROVIDER_SITE_OTHER): Payer: Medicare Other | Admitting: *Deleted

## 2021-06-26 DIAGNOSIS — I10 Essential (primary) hypertension: Secondary | ICD-10-CM | POA: Diagnosis not present

## 2021-06-26 DIAGNOSIS — M8949 Other hypertrophic osteoarthropathy, multiple sites: Secondary | ICD-10-CM | POA: Diagnosis not present

## 2021-06-26 DIAGNOSIS — M159 Polyosteoarthritis, unspecified: Secondary | ICD-10-CM

## 2021-06-26 NOTE — Chronic Care Management (AMB) (Signed)
Chronic Care Management   CCM RN Visit Note  06/26/2021 Name: Megan Chapman MRN: [id number] DOB: Nov 16, 1931  Subjective: Megan Chapman is a 87 y.o. year old female who is a primary care patient of Stacks, Cletus Gash, MD. The care management team was consulted for assistance with disease management and care coordination needs.    Engaged with patient by telephone for follow up visit in response to provider referral for case management and/or care coordination services.   Consent to Services:  The patient was given information about Chronic Care Management services, agreed to services, and gave verbal consent prior to initiation of services.  Please see initial visit note for detailed documentation.   Patient agreed to services and verbal consent obtained.   Assessment: Review of patient past medical history, allergies, medications, health status, including review of consultants reports, laboratory and other test data, was performed as part of comprehensive evaluation and provision of chronic care management services.   SDOH (Social Determinants of Health) assessments and interventions performed:    CCM Care Plan  Allergies  Allergen Reactions   Amoxicillin    Biaxin [Clarithromycin]    Ciprofloxacin Nausea Only   Codeine    Nitrofurantoin Nausea And Vomiting and Nausea Only   Sulfa Antibiotics     Outpatient Encounter Medications as of 06/26/2021  Medication Sig   ALPRAZolam (XANAX) 0.5 MG tablet Take 1 tablet (0.5 mg total) by mouth at bedtime as needed. for sleep   atorvastatin (LIPITOR) 40 MG tablet Take 1 tablet (40 mg total) by mouth daily.   Cholecalciferol (VITAMIN D3) 1000 UNITS CAPS Take 2 capsules (2,000 Units total) by mouth daily.   DULoxetine (CYMBALTA) 30 MG capsule One daily for arthritis  for one week then two daily. Take with a full stomach at suppertime   levothyroxine (SYNTHROID) 25 MCG tablet TAKE 1 TABLET DAILY BEFORE BREAKFAST   Omega-3 Fatty Acids (FISH OIL) 1200  MG CAPS Take 2 capsules by mouth daily.   pantoprazole (PROTONIX) 40 MG tablet Take 40 mg by mouth 2 (two) times daily. 30 minutes before lunch and 30 minutes before dinner   No facility-administered encounter medications on file as of 06/26/2021.    Patient Active Problem List   Diagnosis Date Noted   Palpitations 07/15/2019   Essential hypertension 07/15/2019   Osteoarthritis 04/17/2017   Hypothyroidism 10/11/2015   Vitamin D deficiency 10/11/2015   Hyperlipidemia 06/08/2013    Conditions to be addressed/monitored:HTN and arthritis  Care Plan : RNCM: Osteoarthritis (Adult)  Updates made by Ilean China, RN since 06/26/2021 12:00 AM     Problem: Pain (Osteoarthritis)   Priority: High     Long-Range Goal: Manage Pain   This Visit's Progress: On track  Recent Progress: On track  Priority: Medium  Note:   Current Barriers:  Chronic Disease Management support and education needs related to arthritis and chronic pain associated with arthritis  Nurse Case Manager Clinical Goal(s):  patient will work with orthopedic specialist to address needs related to medical management pain. Patient will talk with RN Care Manager in reference to self-management of osteoarthritis pain.  Interventions:  1:1 collaboration with [name], MD regarding development and update of comprehensive plan of care as evidenced by provider attestation and co-signature Inter-disciplinary care team collaboration (see longitudinal plan of care) Evaluation of current treatment plan related to osteoarthritis and patient's adherence to plan as established by provider. Chart reviewed including relevant office notes, lab results, and imaging reports Reviewed and discussed medications Recently prescribed  Cymbalta and taking Tylenol Arthritis as needed Tolerating Cymbalta well. Will let PCP know if she has any side effects. Discussed visits with chiropractor and orthopedic specialist at [facility] Discussed effect of pain on mobility and daily activities Reviewed upcoming appointments with PCP and orthopedic specialist  Discussed family/social support Discussed aggravating and alleviating factors Therapeutic listening utilized regarding effect of pain on quality of life Encouraged patient to reach out to PCP with any new or worsening symptoms Encouraged patient to reach out to [facility] as needed  Patient Goals/Self-Care Activities Over the next 90 days, patient will: plan exercise or activity when pain is best controlled track what makes the pain worse and what makes it better Tylenol or Tylenol Arthritis for pain relief Take Cymbalta as prescribed and notify PCP of any side effects Do not take any NSAIDs (Ibuprofen, aleve, diclofenac, etc) use ice or heat for pain relief  keep all medical appointments call PCP with any new or worsening symptoms  Follow Up Plan:  Telephone follow up appointment with care management team member scheduled for: 07/27/21 with [facility] The patient has been provided with contact information for the care management team and has been advised to call with any health related questions or concerns.      Care Plan : RNCM: Anemia  Updates made by Ilean China, RN since 06/26/2021 12:00 AM  Completed 06/26/2021   Problem: Anemia Resolved 06/26/2021  Priority: Medium     Goal: Improvement in Anemia Completed 06/26/2021  Start Date: 05/24/2021  This Visit's Progress: On track  Recent Progress: On track  Priority: Medium  Note:   Objective: Lab Results  Component Value Date   WBC 3.2 (L) 06/06/2021   HGB 11.9 06/06/2021   HCT 36.5 06/06/2021   MCV 87 06/06/2021   PLT 213 06/06/2021  Current Barriers:  Unknown cause for anemia Hx of peptic ulcer disease Comorbidities: arthritis,   Nurse Case Manager Clinical Goal(s):  Patient will meet with PCP to address medical management of anemia Patient will demonstrate ongoing self-health  management by eating foods that are rich in iron  Interventions:  1:1 collaboration with [name], MD regarding development and update of comprehensive plan of care as evidenced by provider attestation and co-signature Inter-disciplinary care team collaboration (see longitudinal plan of care) Evaluation of current treatment plan related to anemia and patient's adherence to plan as established by provider. Chart reviewed including relevant office notes and lab results Encouraged patient to reach out to PCP with any new or worsening symptoms Encouraged patient to reach out to [facility] as needed  Patient Goals/Self-Care Activities Over the next 180 days, patient will: Eat foods rich iron: green leafy vegetables, liver Call RN Care Manager as needed [phone] Call PCP with any new or worsening symptoms  Follow Up Plan:  The patient has been provided with contact information for the care management team and has been advised to call with any questions or concerns         Plan:Telephone follow up appointment with care management team member scheduled for:  07/27/21 with RNCM  Chong Sicilian, BSN, RN-BC [place] [facility] [place] Management Direct Dial: [phone]

## 2021-06-26 NOTE — Patient Instructions (Signed)
Visit Information  PATIENT GOALS:  Goals Addressed             This Visit's Progress    COMPLETED: Anemia Managed   On track    Timeframe:  Short-Term Goal Priority:  High Start Date:                             Expected End Date:                       Follow-up: PRN  Eat foods rich iron: green leafy vegetables, liver Call RN Care Manager as needed [phone] Call PCP with any new or worsening symptoms       Manage Pain-Osteoarthritis   Not on track    Timeframe:  Long-Range Goal Priority:  High Start Date:                             Expected End Date:                       Follow Up Date 07/27/21   plan exercise or activity when pain is best controlled track what makes the pain worse and what makes it better Tylenol or Tylenol Arthritis for pain relief Take Cymbalta as prescribed and notify PCP of any side effects Do not take any NSAIDs (Ibuprofen, aleve, diclofenac, etc) use ice or heat for pain relief  keep all medical appointments call PCP with any new or worsening symptoms   Why is this important?   Day-to-day life can be hard when you have joint pain.  Pain medicine is just one piece of the treatment puzzle. There are many things you can do to manage pain and stay strong.   Lifestyle changes like stopping smoking and eating foods with Vitamin D and calcium keeps your bones and muscles healthy. Your joints are better when supported by strong muscles.  You can try these action steps to help you manage your pain.     Notes:       COMPLETED: Peptic Ulcer Healed       Timeframe:  Short-Term Goal Priority:  High Start Date:                             Expected End Date:                       Follow-up: PRN  Call [facility] with any new symptoms [phone] Continue to avoid NSAIDs Take Tylenol or Tylenol arthritis for pain         Patient verbalizes understanding of instructions provided today and agrees to view in [place].    Telephone follow  up appointment with care management team member scheduled for: 07/27/21 with RNCM  Chong Sicilian, BSN, RN-BC [place] [facility] [place] Management Direct Dial: [phone]

## 2021-06-28 DIAGNOSIS — M47812 Spondylosis without myelopathy or radiculopathy, cervical region: Secondary | ICD-10-CM | POA: Diagnosis not present

## 2021-06-28 DIAGNOSIS — M542 Cervicalgia: Secondary | ICD-10-CM | POA: Diagnosis not present

## 2021-06-28 DIAGNOSIS — M9901 Segmental and somatic dysfunction of cervical region: Secondary | ICD-10-CM | POA: Diagnosis not present

## 2021-07-05 DIAGNOSIS — Z029 Encounter for administrative examinations, unspecified: Secondary | ICD-10-CM

## 2021-07-07 ENCOUNTER — Ambulatory Visit (INDEPENDENT_AMBULATORY_CARE_PROVIDER_SITE_OTHER): Payer: Medicare Other | Admitting: *Deleted

## 2021-07-07 ENCOUNTER — Encounter: Payer: Self-pay | Admitting: *Deleted

## 2021-07-07 DIAGNOSIS — Z0000 Encounter for general adult medical examination without abnormal findings: Secondary | ICD-10-CM | POA: Diagnosis not present

## 2021-07-07 NOTE — Progress Notes (Addendum)
MEDICARE ANNUAL WELLNESS VISIT  07/07/2021  Telephone Visit Disclaimer This Medicare AWV was conducted by telephone due to national recommendations for restrictions regarding the COVID-19 Pandemic (e.g. social distancing).  I verified, using two identifiers, that I am speaking with [name] or their authorized healthcare agent. I discussed the limitations, risks, security, and privacy concerns of performing an evaluation and management service by telephone and the potential availability of an in-person appointment in the future. The patient expressed understanding and agreed to proceed.  Location of Patient: Home  Location of Provider (nurse):  [facility]  Subjective:    Megan Chapman is a 87 y.o. female patient of Stacks, Cletus Gash, MD who had a Medicare Annual Wellness Visit today via telephone. Megan Chapman is retired and lives alone. She has 3 living children. One son that visits regularly. She reports that she is socially active and does interact with friends/family regularly. She is minimally physically active and enjoys cooking.  Patient Care Team: Megan Fraise, MD as PCP - General (Family Medicine) Megan Sjogren, MD as Referring Physician (Dermatology) Megan Leber, MD as Referring Physician (Orthopedic Surgery) Megan China, RN as Case Manager  Advanced Directives 07/07/2021 07/06/2020 06/04/2019 10/22/2017 08/22/2016 08/22/2016 04/07/2015  Does Patient Have a Medical Advance Directive? Yes Yes Yes Yes Yes Yes Yes  Type of Paramedic of [place];Living will;Out of facility DNR (pink MOST or yellow form) [place];Living will [place];Living will - [place];Living will [place];Living will Living will;Healthcare Power of Attorney  Does patient want to make changes to medical advance directive? - No - Patient declined No - Patient declined - No - Patient declined - No - Patient declined   Copy of [place] in Chart? No - copy requested No - copy requested No - copy requested - No - copy requested No - copy requested No - copy requested    Hospital Utilization Over the Past 12 Months: # of hospitalizations or ER visits: 0 # of surgeries: 0  Review of Systems    Patient reports that her overall health is worse compared to last year.  History obtained from the patient and patient chart.   Patient Reported Readings (BP, Pulse, CBG, Weight, etc) none  Pain Assessment Pain : 0-10 Pain Score: 7  Pain Type: Chronic pain Pain Location: Hip Pain Orientation: Right Pain Radiating Towards: down leg Pain Descriptors / Indicators: Sharp, Throbbing Pain Onset: More than a month ago Pain Frequency: Intermittent Pain Relieving Factors: ice, heat, medication Effect of Pain on Daily Activities: none  Pain Relieving Factors: ice, heat, medication  Current Medications & Allergies (verified) Allergies as of 07/07/2021       Reactions   Amoxicillin    Biaxin [clarithromycin]    Ciprofloxacin Nausea Only   Codeine    Nitrofurantoin Nausea And Vomiting, Nausea Only   Sulfa Antibiotics         Medication List        Accurate as of July 07, 2021 11:53 AM. If you have any questions, ask your nurse or doctor.          ALPRAZolam 0.5 MG tablet Commonly known as: XANAX Take 1 tablet (0.5 mg total) by mouth at bedtime as needed. for sleep   atorvastatin 40 MG tablet Commonly known as: LIPITOR Take 1 tablet (40 mg total) by mouth daily.   DULoxetine 30 MG capsule Commonly known as: Cymbalta One daily for  arthritis  for one week then two daily. Take with a full stomach at suppertime   Fish Oil 1200 MG Caps Take 2 capsules by mouth daily.   levothyroxine 25 MCG tablet Commonly known as: SYNTHROID TAKE 1 TABLET DAILY BEFORE BREAKFAST   pantoprazole 40 MG tablet Commonly known as: PROTONIX Take 40 mg by mouth 2 (two) times daily. 30 minutes  before lunch and 30 minutes before dinner   Vitamin D3 25 MCG (1000 UT) Caps Take 2 capsules (2,000 Units total) by mouth daily.        History (reviewed): Past Medical History:  Diagnosis Date   Allergy    Anxiety    Arthritis    Asthma    not treated   Basal cell carcinoma    Cataract    GERD (gastroesophageal reflux disease)    Not treated   HTN (hypertension)    Hyperlipidemia    Migraine    Palpitations    Thyroid disease    hypothyroid   Past Surgical History:  Procedure Laterality Date   ABDOMINAL HYSTERECTOMY     partial   APPENDECTOMY     BASAL CELL CARCINOMA EXCISION  2017   nose   CATARACT EXTRACTION Bilateral    COLONOSCOPY     MENISCUS REPAIR Right    03/11/15   PARTIAL KNEE ARTHROPLASTY Left    TONSILLECTOMY     Family History  Problem Relation Age of Onset   Colon cancer Mother    Cancer - Colon Mother        w/ mets to lung   Prostate cancer Father        w/ mets to bone   Cancer Brother    Stroke Brother    Migraines Brother    COPD Son    Migraines Son    Down syndrome Daughter    Esophageal cancer Neg Hx    Rectal cancer Neg Hx    Stomach cancer Neg Hx    Social History   Socioeconomic History   Marital status: Widowed    Spouse name: Not on file   Number of children: 4   Years of education: 10   Highest education level: GED or equivalent  Occupational History   Occupation: retired  Tobacco Use   Smoking status: Never   Smokeless tobacco: Never  Vaping Use   Vaping Use: Never used  Substance and Sexual Activity   Alcohol use: No   Drug use: No   Sexual activity: Not Currently    Birth control/protection: Surgical  Other Topics Concern   Not on file  Social History Narrative   Lives alone.  Widow twice.  Worked until 52 years.     Social Determinants of Health   Financial Resource Strain: Not on file  Food Insecurity: Not on file  Transportation Needs: Not on file  Physical Activity: Not on file  Stress: Not on  file  Social Connections: Not on file    Activities of Daily Living In your present state of health, do you have any difficulty performing the following activities: 07/07/2021  Hearing? Y  Comment has hearing aids  Vision? N  Difficulty concentrating or making decisions? Y  Comment forgetful  Walking or climbing stairs? N  Dressing or bathing? N  Doing errands, shopping? N  Preparing Food and eating ? N  Using the Toilet? N  In the past six months, have you accidently leaked urine? N  Do you have problems with loss of bowel  control? N  Managing your Medications? N  Managing your Finances? N  Housekeeping or managing your Housekeeping? Y  Some recent data might be hidden    Patient Education/ Literacy How often do you need to have someone help you when you read instructions, pamphlets, or other written materials from your doctor or pharmacy?: 1 - Never What is the last grade level you completed in school?: GED  Exercise Current Exercise Habits: The patient does not participate in regular exercise at present, Exercise limited by: orthopedic condition(s)  Diet Patient reports consuming 3 meals a day and 1 snack(s) a day Patient reports that her primary diet is: Regular Patient reports that she does have regular access to food.   Depression Screen PHQ 2/9 Scores 07/07/2021 06/06/2021 02/14/2021 12/06/2020 10/04/2020 07/13/2020 07/06/2020  PHQ - 2 Score 0 0 0 0 0 0 1  PHQ- 9 Score - 2 - - - - 3     Fall Risk Fall Risk  07/07/2021 06/06/2021 02/14/2021 12/06/2020 10/04/2020  Falls in the past year? 0 0 0 0 0  Number falls in past yr: - - - - -  Injury with Fall? - - - - -  Risk for fall due to : - - - - -  Follow up - - Falls evaluation completed Falls evaluation completed Falls evaluation completed     Objective:  Megan Chapman seemed alert and oriented and she participated appropriately during our telephone visit.  Blood Pressure Weight BMI  BP Readings from Last 3 Encounters:   06/06/21 (!) 156/74  03/03/21 129/61  03/01/21 126/88   Wt Readings from Last 3 Encounters:  06/06/21 142 lb 9.6 oz (64.7 kg)  03/03/21 143 lb (64.9 kg)  03/01/21 143 lb 6.4 oz (65 kg)   BMI Readings from Last 1 Encounters:  06/06/21 26.08 kg/m    *Unable to obtain current vital signs, weight, and BMI due to telephone visit type  Hearing/Vision  Megan Chapman did not seem to have difficulty with hearing/understanding during the telephone conversation Reports that she has had a formal eye exam by an eye care professional within the past year Reports that she has not had a formal hearing evaluation within the past year *Unable to fully assess hearing and vision during telephone visit type  Cognitive Function: 6CIT Screen 07/07/2021 07/06/2020 06/04/2019  What Year? 0 points 0 points 0 points  What month? 0 points 0 points 0 points  What time? 3 points 0 points 0 points  Count back from 20 0 points 0 points 0 points  Months in reverse 0 points 0 points 0 points  Repeat phrase 4 points 2 points 2 points  Total Score 7 2 2    (Normal:0-7, Significant for Dysfunction: >8)  Normal Cognitive Function Screening: Yes   Immunization & Health Maintenance Record Immunization History  Administered Date(s) Administered   Fluad Quad(high Dose 65+) 09/29/2019, 12/06/2020   Influenza Split 10/14/2020   Influenza, High Dose Seasonal PF 10/14/2017, 10/14/2018   Influenza,inj,Quad PF,6+ Mos 11/15/2014, 10/12/2016   Moderna SARS-COV2 Booster Vaccination 12/28/2020   Moderna Sars-Covid-2 Vaccination 01/13/2020, 02/10/2020   Pneumococcal Conjugate-13 04/14/2018   Pneumococcal Polysaccharide-23 11/30/2002   Td 05/31/2005    Health Maintenance  Topic Date Due   DEXA SCAN  04/04/2020   COVID-19 Vaccine (4 - Booster for Moderna series) 03/28/2021   Zoster Vaccines- Shingrix (1 of 2) 09/06/2021 (Originally 10/10/1950)   MAMMOGRAM  06/06/2022 (Originally 08/22/2017)   TETANUS/TDAP  06/06/2022 (Originally  06/01/2015)   INFLUENZA  VACCINE  07/31/2021   PNA vac Low Risk Adult  Completed   HPV VACCINES  Aged Out       Assessment  This is a routine wellness examination for Megan Chapman.  Health Maintenance: Due or Overdue Health Maintenance Due  Topic Date Due   DEXA SCAN  04/04/2020   COVID-19 Vaccine (4 - Booster for Moderna series) 03/28/2021    Megan Chapman does not need a referral for Community Assistance: Care Management:   no Social Work:    no Prescription Assistance:  no Nutrition/Diabetes Education:  no   Plan:  Personalized Goals  Goals Addressed             This Visit's Progress    Patient Stated       07/07/2021 AWV Goal: Keep All Scheduled Appointments  Over the next year, patient will attend all scheduled appointments with their PCP and any specialists that they see.         Personalized Health Maintenance & Screening Recommendations    Lung Cancer Screening Recommended: no (Low Dose CT Chest recommended if Age 19-80 years, 30 pack-year currently smoking OR have quit w/in past 15 years) Hepatitis C Screening recommended: no HIV Screening recommended: no  Advanced Directives: Written information was not prepared per patient's request.  Referrals & Orders No orders of the defined types were placed in this encounter.   Follow-up Plan Follow-up with Megan Fraise, MD as planned   I have personally reviewed and noted the following in the patient's chart:   Medical and social history Use of alcohol, tobacco or illicit drugs  Current medications and supplements Functional ability and status Nutritional status Physical activity Advanced directives List of other physicians Hospitalizations, surgeries, and ER visits in previous 12 months Vitals Screenings to include cognitive, depression, and falls Referrals and appointments  In addition, I have reviewed and discussed with Megan Chapman certain preventive protocols, quality metrics, and best practice  recommendations. A written personalized care plan for preventive services as well as general preventive health recommendations is available and can be mailed to the patient at her request.      Baldomero Lamy, LPN

## 2021-07-12 DIAGNOSIS — M47812 Spondylosis without myelopathy or radiculopathy, cervical region: Secondary | ICD-10-CM | POA: Diagnosis not present

## 2021-07-12 DIAGNOSIS — M542 Cervicalgia: Secondary | ICD-10-CM | POA: Diagnosis not present

## 2021-07-12 DIAGNOSIS — M9901 Segmental and somatic dysfunction of cervical region: Secondary | ICD-10-CM | POA: Diagnosis not present

## 2021-07-26 DIAGNOSIS — M47812 Spondylosis without myelopathy or radiculopathy, cervical region: Secondary | ICD-10-CM | POA: Diagnosis not present

## 2021-07-26 DIAGNOSIS — M542 Cervicalgia: Secondary | ICD-10-CM | POA: Diagnosis not present

## 2021-07-26 DIAGNOSIS — M9901 Segmental and somatic dysfunction of cervical region: Secondary | ICD-10-CM | POA: Diagnosis not present

## 2021-07-27 ENCOUNTER — Ambulatory Visit (INDEPENDENT_AMBULATORY_CARE_PROVIDER_SITE_OTHER): Payer: Medicare Other | Admitting: *Deleted

## 2021-07-27 ENCOUNTER — Telehealth: Payer: Self-pay | Admitting: *Deleted

## 2021-07-27 ENCOUNTER — Other Ambulatory Visit: Payer: Self-pay | Admitting: Family Medicine

## 2021-07-27 DIAGNOSIS — I10 Essential (primary) hypertension: Secondary | ICD-10-CM

## 2021-07-27 DIAGNOSIS — M8949 Other hypertrophic osteoarthropathy, multiple sites: Secondary | ICD-10-CM | POA: Diagnosis not present

## 2021-07-27 DIAGNOSIS — M159 Polyosteoarthritis, unspecified: Secondary | ICD-10-CM

## 2021-07-27 DIAGNOSIS — F419 Anxiety disorder, unspecified: Secondary | ICD-10-CM

## 2021-07-27 MED ORDER — DULOXETINE HCL 30 MG PO CPEP: 30.0000 mg | ORAL_CAPSULE | Freq: Every day | ORAL | 1 refills | Status: DC

## 2021-07-27 NOTE — Chronic Care Management (AMB) (Signed)
Chronic Care Management   CCM RN Visit Note  07/27/2021 Name: Megan Chapman MRN: [id number] DOB: 1931/01/02  Subjective: Megan Chapman is a 87 y.o. year old female who is a primary care patient of Stacks, Cletus Gash, MD. The care management team was consulted for assistance with disease management and care coordination needs.    Engaged with patient by telephone for follow up visit in response to provider referral for case management and/or care coordination services.   Consent to Services:  The patient was given information about Chronic Care Management services, agreed to services, and gave verbal consent prior to initiation of services.  Please see initial visit note for detailed documentation.   Patient agreed to services and verbal consent obtained.   Assessment: Review of patient past medical history, allergies, medications, health status, including review of consultants reports, laboratory and other test data, was performed as part of comprehensive evaluation and provision of chronic care management services.   SDOH (Social Determinants of Health) assessments and interventions performed:  N/A  CCM Care Plan  Allergies  Allergen Reactions   Amoxicillin    Biaxin [Clarithromycin]    Ciprofloxacin Nausea Only   Codeine    Nitrofurantoin Nausea And Vomiting and Nausea Only   Sulfa Antibiotics     Outpatient Encounter Medications as of 07/27/2021  Medication Sig   ALPRAZolam (XANAX) 0.5 MG tablet Take 1 tablet (0.5 mg total) by mouth at bedtime as needed. for sleep   atorvastatin (LIPITOR) 40 MG tablet Take 1 tablet (40 mg total) by mouth daily.   Cholecalciferol (VITAMIN D3) 1000 UNITS CAPS Take 2 capsules (2,000 Units total) by mouth daily.   DULoxetine (CYMBALTA) 30 MG capsule One daily for arthritis  for one week then two daily. Take with a full stomach at suppertime (Patient taking differently: Take 30 mg by mouth daily. One daily for arthritis  for one week then two daily. Take  with a full stomach at suppertime)   levothyroxine (SYNTHROID) 25 MCG tablet TAKE 1 TABLET DAILY BEFORE BREAKFAST   Omega-3 Fatty Acids (FISH OIL) 1200 MG CAPS Take 2 capsules by mouth daily.   pantoprazole (PROTONIX) 40 MG tablet Take 40 mg by mouth 2 (two) times daily. 30 minutes before lunch and 30 minutes before dinner   No facility-administered encounter medications on file as of 07/27/2021.    Patient Active Problem List   Diagnosis Date Noted   Palpitations 07/15/2019   Essential hypertension 07/15/2019   Osteoarthritis 04/17/2017   Hypothyroidism 10/11/2015   Vitamin D deficiency 10/11/2015   Hyperlipidemia 06/08/2013    Conditions to be addressed/monitored:HTN and osteoarthritis  Care Plan : RNCM: Osteoarthritis (Adult)  Updates made by Ilean China, RN since 07/27/2021 12:00 AM     Problem: Pain (Osteoarthritis)   Priority: High     Long-Range Goal: Manage Pain   This Visit's Progress: On track  Recent Progress: On track  Priority: Medium  Note:   Current Barriers:  Chronic Disease Management support and education needs related to arthritis and chronic pain associated with arthritis Comorbidites: HTN and thyroid disease  Nurse Case Manager Clinical Goal(s):  patient will work with orthopedic specialist to address needs related to medical management pain. Patient will talk with RN Care Manager in reference to self-management of osteoarthritis pain.  Interventions:  1:1 collaboration with [name], MD regarding development and update of comprehensive plan of care as evidenced by provider attestation and co-signature Inter-disciplinary care team collaboration (see longitudinal plan of care) Evaluation of  current treatment plan related to osteoarthritis and patient's adherence to plan as established by provider. Chart reviewed including relevant office notes, lab results, and imaging reports Reviewed and discussed medications Recently prescribed Cymbalta  and taking Tylenol Arthritis as needed Tolerating 1 Cymbalta a day well. Was not able to increase to 2 a day because it made her fall asleep during the day and "talk crazy" Cymbalta is helping with her discomfort and she is resting better RNCM will collaborate with PCP regarding Cymbalta directions  Note added to med list reflecting how patient is taking Cymbalta Discussed visits [facility] specialist [facility] Scheduled for back injection next week Encouraged patient to reach out to PCP with any new or worsening symptoms Encouraged patient to reach out to [facility] as needed  Patient Goals/Self-Care Activities Over the next 90 days, patient will: plan exercise or activity when pain is best controlled track what makes the pain worse and what makes it better Tylenol or Tylenol Arthritis for pain relief Take Cymbalta as prescribed and notify PCP of any side effects Do not take any NSAIDs (Ibuprofen, aleve, diclofenac, etc) use ice or heat for pain relief  keep all medical appointments call PCP with any new or worsening symptoms     Plan:Telephone follow up appointment with care management team member scheduled for:  09/27/21 with RNCM  Chong Sicilian, BSN, RN-BC [place] [facility] [place] [facility] Direct Dial: [phone]

## 2021-07-27 NOTE — Patient Instructions (Signed)
Visit Information  PATIENT GOALS:  Goals Addressed             This Visit's Progress    Manage Pain-Osteoarthritis   On track    Timeframe:  Long-Range Goal Priority:  High Start Date:                             Expected End Date:                       Follow Up Date 09/27/21   plan exercise or activity when pain is best controlled track what makes the pain worse and what makes it better Tylenol or Tylenol Arthritis for pain relief Take Cymbalta as prescribed and notify PCP of any side effects Do not take any NSAIDs (Ibuprofen, aleve, diclofenac, etc) use ice or heat for pain relief  keep all medical appointments call PCP with any new or worsening symptoms   Why is this important?   Day-to-day life can be hard when you have joint pain.  Pain medicine is just one piece of the treatment puzzle. There are many things you can do to manage pain and stay strong.   Lifestyle changes like stopping smoking and eating foods with Vitamin D and calcium keeps your bones and muscles healthy. Your joints are better when supported by strong muscles.  You can try these action steps to help you manage your pain.     Notes:         Patient verbalizes understanding of instructions provided today and agrees to view in [place].   Telephone follow up appointment with care management team member scheduled for: 09/27/21 with RNCM  Chong Sicilian, BSN, RN-BC [place] / [place] Management Direct Dial: [phone]

## 2021-07-27 NOTE — Telephone Encounter (Signed)
07/27/2021  FYI patient is only taking '30mg'$  of Cymbalta. She went up to 60 for a few days during the 2nd week but it made her very sleepy during the day. She is not having any side effects with '30mg'$ s and feels that it is helping her to rest better.   Chong Sicilian, BSN, RN-BC Embedded Chronic Care Manager [facility] [place] [facility] Direct Dial: [phone]

## 2021-08-03 ENCOUNTER — Other Ambulatory Visit: Payer: Self-pay | Admitting: Family Medicine

## 2021-08-04 DIAGNOSIS — M461 Sacroiliitis, not elsewhere classified: Secondary | ICD-10-CM | POA: Diagnosis not present

## 2021-08-04 DIAGNOSIS — M7918 Myalgia, other site: Secondary | ICD-10-CM | POA: Diagnosis not present

## 2021-08-04 DIAGNOSIS — M533 Sacrococcygeal disorders, not elsewhere classified: Secondary | ICD-10-CM | POA: Diagnosis not present

## 2021-08-29 ENCOUNTER — Ambulatory Visit (INDEPENDENT_AMBULATORY_CARE_PROVIDER_SITE_OTHER): Payer: Medicare Other | Admitting: Nurse Practitioner

## 2021-08-29 ENCOUNTER — Encounter: Payer: Self-pay | Admitting: Nurse Practitioner

## 2021-08-29 DIAGNOSIS — U071 COVID-19: Secondary | ICD-10-CM

## 2021-08-29 MED ORDER — MOLNUPIRAVIR EUA 200MG CAPSULE: 4.0000 | ORAL_CAPSULE | Freq: Two times a day (BID) | ORAL | 0 refills | Status: AC

## 2021-08-29 NOTE — Progress Notes (Signed)
   Virtual Visit  Note Due to COVID-19 pandemic this visit was conducted virtually. This visit type was conducted due to national recommendations for restrictions regarding the COVID-19 Pandemic (e.g. social distancing, sheltering in place) in an effort to limit this patient's exposure and mitigate transmission in our community. All issues noted in this document were discussed and addressed.  A physical exam was not performed with this format.  I connected with Megan Chapman on 08/29/21 at 08:40 am by telephone and verified that I am speaking with the correct person using two identifiers. Megan Chapman is currently located at home during visit. The provider, Ivy Lynn, NP is located in their office at time of visit.  I discussed the limitations, risks, security and privacy concerns of performing an evaluation and management service by telephone and the availability of in person appointments. I also discussed with the patient that there may be a patient responsible charge related to this service. The patient expressed understanding and agreed to proceed.   History and Present Illness:  Cough This is a new problem. The current episode started yesterday. The problem has been unchanged. The problem occurs constantly. The cough is Non-productive. Associated symptoms include chills, headaches and nasal congestion. Pertinent negatives include no sore throat. Nothing aggravates the symptoms. She has tried nothing for the symptoms.     Review of Systems  Constitutional:  Positive for chills.  HENT:  Negative for sore throat.   Respiratory:  Positive for cough.   Neurological:  Positive for headaches.  All other systems reviewed and are negative.   Observations/Objective: Televisit patient not in distress.  Assessment and Plan: Symptoms of fever, chills, nausea not well controlled.  At home COVID-19 test positive.  Symptoms in the last 24 hours.  Rx Molnupiravir EUA 200 mg caps sent to pharmacy,  increase hydration, continue to monitor oxygen saturation.  Education provided over from, patient/son verbalized understanding.  Follow Up Instructions: Follow-up with unresolved symptoms.    I discussed the assessment and treatment plan with the patient. The patient was provided an opportunity to ask questions and all were answered. The patient agreed with the plan and demonstrated an understanding of the instructions.   The patient was advised to call back or seek an in-person evaluation if the symptoms worsen or if the condition fails to improve as anticipated.  The above assessment and management plan was discussed with the patient. The patient verbalized understanding of and has agreed to the management plan. Patient is aware to call the clinic if symptoms persist or worsen. Patient is aware when to return to the clinic for a follow-up visit. Patient educated on when it is appropriate to go to the emergency department.   Time call ended:  8:50 am   I provided 10 minutes of  non face-to-face time during this encounter.    Ivy Lynn, NP

## 2021-08-31 ENCOUNTER — Telehealth: Payer: Self-pay | Admitting: Family Medicine

## 2021-08-31 NOTE — Telephone Encounter (Signed)
Son called stating that mother started the antiviral medication on Tuesday and is not feeling much better. She feels weak and has a cough and a sore throat. Low grade fever of 99. Advised that it may take a few more days before she starts to feel better and the cough and weakness would last for some time. No SOB per son. He purchased a pulse ox machine and her sats have been running from 92-94. Advised son to have mom to continue meds and if she becomes SOB or her O2 drops below 90 to take her to the ER. Son verbalized understanding.

## 2021-09-01 ENCOUNTER — Telehealth: Payer: Self-pay | Admitting: Family Medicine

## 2021-09-01 MED ORDER — BENZONATATE 100 MG PO CAPS: 200.0000 mg | ORAL_CAPSULE | Freq: Three times a day (TID) | ORAL | 0 refills | Status: DC | PRN

## 2021-09-01 NOTE — Telephone Encounter (Signed)
Son aware.

## 2021-09-01 NOTE — Telephone Encounter (Signed)
Sent Tessalon Perles for the patient

## 2021-09-11 NOTE — Telephone Encounter (Signed)
Erroneous Encounter. Please disregard.    

## 2021-09-26 ENCOUNTER — Encounter: Payer: Self-pay | Admitting: Family Medicine

## 2021-09-26 ENCOUNTER — Ambulatory Visit (INDEPENDENT_AMBULATORY_CARE_PROVIDER_SITE_OTHER): Payer: Medicare Other

## 2021-09-26 ENCOUNTER — Ambulatory Visit (INDEPENDENT_AMBULATORY_CARE_PROVIDER_SITE_OTHER): Payer: Medicare Other | Admitting: Family Medicine

## 2021-09-26 ENCOUNTER — Other Ambulatory Visit: Payer: Self-pay

## 2021-09-26 VITALS — BP 134/84 | HR 88 | Temp 97.7°F | Ht 62.0 in | Wt 140.0 lb

## 2021-09-26 DIAGNOSIS — R0789 Other chest pain: Secondary | ICD-10-CM

## 2021-09-26 DIAGNOSIS — W19XXXA Unspecified fall, initial encounter: Secondary | ICD-10-CM

## 2021-09-26 DIAGNOSIS — M19012 Primary osteoarthritis, left shoulder: Secondary | ICD-10-CM | POA: Diagnosis not present

## 2021-09-26 DIAGNOSIS — M25512 Pain in left shoulder: Secondary | ICD-10-CM | POA: Diagnosis not present

## 2021-09-26 DIAGNOSIS — R0781 Pleurodynia: Secondary | ICD-10-CM

## 2021-09-26 MED ORDER — METHYLPREDNISOLONE ACETATE 40 MG/ML IJ SUSP
40.0000 mg | Freq: Once | INTRAMUSCULAR | Status: AC
  Administered 2021-09-26: 40 mg via INTRAMUSCULAR

## 2021-09-26 NOTE — Progress Notes (Signed)
Subjective:  Patient ID: Megan Chapman, female    DOB: 05/01/1931, 87 y.o.   MRN: [id number]  Patient Care Team: [name], MD as PCP - General (Family Medicine) Levy Sjogren, MD as Referring Physician (Dermatology) Henreitta Leber, MD as Referring Physician (Orthopedic Surgery) Ilean China, RN as Case Manager   Chief Complaint:  Fall (Left shoulder flank rib pain)   HPI: Megan Chapman is a 87 y.o. female presenting on 09/26/2021 for Fall (Left shoulder flank rib pain)   Pt presents today for left shoulder pain and left rib pain after a fall on Thursday. She states she stumbled and fell into a chair landing on her left side. She denies hitting ground. She did not hit head or have LOC.   Fall The accident occurred 3 to 5 days ago. The fall occurred while walking. She fell from a height of 1 to 2 ft. The point of impact was the left shoulder (left side). The pain is present in the left shoulder (left side/ribs). The pain is at a severity of 3/10. The pain is mild. The symptoms are aggravated by movement, extension, flexion and pressure on injury. Pertinent negatives include no abdominal pain, bowel incontinence, fever, headaches, hearing loss, hematuria, loss of consciousness, nausea, numbness, tingling, visual change or vomiting. She has tried acetaminophen for the symptoms. The treatment provided moderate relief.   Relevant past medical, surgical, family, and social history reviewed and updated as indicated.  Allergies and medications reviewed and updated. Data reviewed: Chart in Epic.   Past Medical History:  Diagnosis Date   Allergy    Anxiety    Arthritis    Asthma    not treated   Basal cell carcinoma    Cataract    GERD (gastroesophageal reflux disease)    Not treated   HTN (hypertension)    Hyperlipidemia    Migraine    Palpitations    Thyroid disease    hypothyroid    Past Surgical History:  Procedure Laterality Date   ABDOMINAL HYSTERECTOMY      partial   APPENDECTOMY     BASAL CELL CARCINOMA EXCISION  2017   nose   CATARACT EXTRACTION Bilateral    COLONOSCOPY     MENISCUS REPAIR Right    03/11/15   PARTIAL KNEE ARTHROPLASTY Left    TONSILLECTOMY      Social History   Socioeconomic History   Marital status: Widowed    Spouse name: Not on file   Number of children: 4   Years of education: 10   Highest education level: GED or equivalent  Occupational History   Occupation: retired  Tobacco Use   Smoking status: Never   Smokeless tobacco: Never  Vaping Use   Vaping Use: Never used  Substance and Sexual Activity   Alcohol use: No   Drug use: No   Sexual activity: Not Currently    Birth control/protection: Surgical  Other Topics Concern   Not on file  Social History Narrative   Lives alone.  Widow twice.  Worked until 45 years.     Social Determinants of Health   Financial Resource Strain: Not on file  Food Insecurity: No Food Insecurity   Worried About Charity fundraiser in the Last Year: Never true   Ran Out of Food in the Last Year: Never true  Transportation Needs: No Transportation Needs   Lack of Transportation (Medical): No   Lack of Transportation (Non-Medical): No  Physical Activity:  Not on file  Stress: Not on file  Social Connections: Moderately Integrated   Frequency of Communication with Friends and Family: More than three times a week   Frequency of Social Gatherings with Friends and Family: More than three times a week   Attends Religious Services: More than 4 times per year   Active Member of [employer] or Organizations: Yes   Attends Archivist Meetings: More than 4 times per year   Marital Status: Widowed  Intimate Partner Violence: Not on file    Outpatient Encounter Medications as of 09/26/2021  Medication Sig   ALPRAZolam (XANAX) 0.5 MG tablet Take 1 tablet (0.5 mg total) by mouth at bedtime as needed. for sleep   atorvastatin (LIPITOR) 40 MG tablet TAKE 1 TABLET DAILY    Cholecalciferol (VITAMIN D3) 1000 UNITS CAPS Take 2 capsules (2,000 Units total) by mouth daily.   DULoxetine (CYMBALTA) 30 MG capsule Take 1 capsule (30 mg total) by mouth daily. for arthritis   levothyroxine (SYNTHROID) 25 MCG tablet TAKE 1 TABLET DAILY BEFORE BREAKFAST   Omega-3 Fatty Acids (FISH OIL) 1200 MG CAPS Take 2 capsules by mouth daily.   pantoprazole (PROTONIX) 40 MG tablet Take 40 mg by mouth 2 (two) times daily. 30 minutes before lunch and 30 minutes before dinner   [DISCONTINUED] benzonatate (TESSALON PERLES) 100 MG capsule Take 2 capsules (200 mg total) by mouth 3 (three) times daily as needed for cough.   No facility-administered encounter medications on file as of 09/26/2021.    Allergies  Allergen Reactions   Amoxicillin    Biaxin [Clarithromycin]    Ciprofloxacin Nausea Only   Codeine    Nitrofurantoin Nausea And Vomiting and Nausea Only   Sulfa Antibiotics     Review of Systems  Constitutional:  Negative for activity change, appetite change, chills, diaphoresis, fatigue, fever and unexpected weight change.  HENT: Negative.    Eyes: Negative.   Respiratory:  Negative for cough, chest tightness and shortness of breath.   Cardiovascular:  Negative for chest pain, palpitations and leg swelling.  Gastrointestinal:  Negative for abdominal pain, blood in stool, bowel incontinence, constipation, diarrhea, nausea and vomiting.  Endocrine: Negative.   Genitourinary:  Negative for decreased urine volume, dysuria, frequency, hematuria and urgency.  Musculoskeletal:  Positive for arthralgias, back pain, gait problem and myalgias. Negative for joint swelling, neck pain and neck stiffness.  Skin: Negative.   Allergic/Immunologic: Negative.   Neurological:  Negative for dizziness, tingling, tremors, seizures, loss of consciousness, syncope, facial asymmetry, speech difficulty, weakness, light-headedness, numbness and headaches.  Hematological: Negative.    Psychiatric/Behavioral:  Negative for confusion, hallucinations, sleep disturbance and suicidal ideas.   All other systems reviewed and are negative.      Objective:  BP 134/84   Pulse 88   Temp 97.7 F (36.5 C)   Ht _0  (1.575 m)   Wt 140 lb (63.5 kg)   SpO2 96%   BMI 25.61 kg/m    Wt Readings from Last 3 Encounters:  09/26/21 140 lb (63.5 kg)  06/06/21 142 lb 9.6 oz (64.7 kg)  03/03/21 143 lb (64.9 kg)    Physical Exam Vitals and nursing note reviewed.  Constitutional:      General: She is not in acute distress.    Appearance: Normal appearance. She is well-developed, well-groomed and normal weight. She is not ill-appearing, toxic-appearing or diaphoretic.  HENT:     Head: Normocephalic and atraumatic.     Jaw: There is normal jaw  occlusion.     Right Ear: Hearing normal.     Left Ear: Hearing normal.     Nose: Nose normal.     Mouth/Throat:     Lips: Pink.     Mouth: Mucous membranes are moist.     Pharynx: Oropharynx is clear. Uvula midline.  Eyes:     General: Lids are normal.     Extraocular Movements: Extraocular movements intact.     Conjunctiva/sclera: Conjunctivae normal.     Pupils: Pupils are equal, round, and reactive to light.  Neck:     Thyroid: No thyroid mass, thyromegaly or thyroid tenderness.     Vascular: No carotid bruit or JVD.     Trachea: Trachea and phonation normal.  Cardiovascular:     Rate and Rhythm: Normal rate and regular rhythm.     Chest Wall: PMI is not displaced.     Pulses: Normal pulses.     Heart sounds: Normal heart sounds. No murmur heard.   No friction rub. No gallop.  Pulmonary:     Effort: Pulmonary effort is normal. No respiratory distress.     Breath sounds: Normal breath sounds. No wheezing.  Chest:     Chest wall: Tenderness (left lateral chest wall) present. No mass, lacerations, deformity, swelling, crepitus or edema. There is no dullness to percussion.  Abdominal:     General: Bowel sounds are normal.  There is no distension or abdominal bruit.     Palpations: Abdomen is soft. There is no hepatomegaly or splenomegaly.     Tenderness: There is no abdominal tenderness. There is no right CVA tenderness or left CVA tenderness.     Hernia: No hernia is present.  Musculoskeletal:     Right shoulder: Normal.     Left shoulder: Tenderness present. No swelling, deformity, effusion, laceration, bony tenderness or crepitus. Decreased range of motion (adduction and abduction increases pain). Normal strength. Normal pulse.     Right upper arm: Normal.     Left upper arm: Normal.     Cervical back: Normal, normal range of motion and neck supple.     Thoracic back: No swelling or tenderness. Scoliosis present.     Lumbar back: No swelling or tenderness. Scoliosis present.     Right lower leg: No edema.     Left lower leg: No edema.  Lymphadenopathy:     Cervical: No cervical adenopathy.  Skin:    General: Skin is warm and dry.     Capillary Refill: Capillary refill takes less than 2 seconds.     Coloration: Skin is not cyanotic, jaundiced or pale.     Findings: No rash.  Neurological:     General: No focal deficit present.     Mental Status: She is alert and oriented to person, place, and time.     Cranial Nerves: Cranial nerves are intact.     Sensory: Sensation is intact.     Motor: Motor function is intact.     Coordination: Coordination is intact.     Gait: Gait abnormal (slow).     Deep Tendon Reflexes: Reflexes are normal and symmetric.  Psychiatric:        Attention and Perception: Attention and perception normal.        Mood and Affect: Mood and affect normal.        Speech: Speech normal.        Behavior: Behavior normal. Behavior is cooperative.        Thought Content: Thought  content normal.        Cognition and Memory: Cognition and memory normal.        Judgment: Judgment normal.    Results for orders placed or performed in visit on 06/06/21  CBC with Differential/Platelet   Result Value Ref Range   WBC 3.2 (L) 3.4 - 10.8 x10E3/uL   RBC 4.21 3.77 - 5.28 x10E6/uL   Hemoglobin 11.9 11.1 - 15.9 g/dL   Hematocrit 36.5 34.0 - 46.6 %   MCV 87 79 - 97 fL   MCH 28.3 26.6 - 33.0 pg   MCHC 32.6 31.5 - 35.7 g/dL   RDW 14.2 11.7 - 15.4 %   Platelets 213 150 - 450 x10E3/uL   Neutrophils 40 Not Estab. %   Lymphs 39 Not Estab. %   Monocytes 19 Not Estab. %   Eos 2 Not Estab. %   Basos 0 Not Estab. %   Neutrophils Absolute 1.3 (L) 1.4 - 7.0 x10E3/uL   Lymphocytes Absolute 1.2 0.7 - 3.1 x10E3/uL   Monocytes Absolute 0.6 0.1 - 0.9 x10E3/uL   EOS (ABSOLUTE) 0.1 0.0 - 0.4 x10E3/uL   Basophils Absolute 0.0 0.0 - 0.2 x10E3/uL   Immature Granulocytes 0 Not Estab. %   Immature Grans (Abs) 0.0 0.0 - 0.1 x10E3/uL  CMP14+EGFR  Result Value Ref Range   Glucose 86 65 - 99 mg/dL   BUN 20 8 - 27 mg/dL   Creatinine, Ser 1.14 (H) 0.57 - 1.00 mg/dL   eGFR 47 (L) >59 mL/min/1.73   BUN/Creatinine Ratio 18 12 - 28   Sodium 138 134 - 144 mmol/L   Potassium 4.4 3.5 - 5.2 mmol/L   Chloride 101 96 - 106 mmol/L   CO2 24 20 - 29 mmol/L   Calcium 10.2 8.7 - 10.3 mg/dL   Total Protein 7.4 6.0 - 8.5 g/dL   Albumin 4.5 3.6 - 4.6 g/dL   Globulin, Total 2.9 1.5 - 4.5 g/dL   Albumin/Globulin Ratio 1.6 1.2 - 2.2   Bilirubin Total 0.6 0.0 - 1.2 mg/dL   Alkaline Phosphatase 57 44 - 121 IU/L   AST 15 0 - 40 IU/L   ALT 13 0 - 32 IU/L  Lipid panel  Result Value Ref Range   Cholesterol, Total 200 (H) 100 - 199 mg/dL   Triglycerides 172 (H) 0 - 149 mg/dL   HDL 52 >39 mg/dL   VLDL Cholesterol Cal 30 5 - 40 mg/dL   LDL Chol Calc (NIH) 118 (H) 0 - 99 mg/dL   Chol/HDL Ratio 3.8 0.0 - 4.4 ratio  TSH + free T4  Result Value Ref Range   TSH 4.000 0.450 - 4.500 uIU/mL   Free T4 1.24 0.82 - 1.77 ng/dL     X-Ray: left shoulder: degenerative changes, no acute findings. Preliminary x-ray reading by Monia Pouch, FNP-C, [facility]. X-Ray: left ribs chest: concerning for rib fractures, scoliosis, no  pneumothorax. Preliminary x-ray reading by Monia Pouch, FNP-C, [facility]. Will notify pt if radiology readings differ.    Pertinent labs & imaging results that were available during my care of the patient were reviewed by me and considered in my medical decision making.  Assessment & Plan:  Megan Chapman was seen today for fall.  Diagnoses and all orders for this visit:  Fall, initial encounter Rib pain on left side Acute pain of left shoulder Shoulder imaging without acute findings, degenerative changes. Rib and chest xray concerning for rib fractures, no pneumothorax. Degenerative changes of spine with noted scoliosis.  Will notify pt if radiology reading differs. Musculoskeletal pain from fall, will burst with steroids in office today. Continue Tylenol as needed for pain. Report any new, worsening, or persistent symptoms. Symptomatic care discussed in detail.  -     DG Shoulder Left; Future -     DG Ribs Unilateral W/Chest Left; Future    Continue all other maintenance medications.  Follow up plan: Return in about 4 weeks (around 10/24/2021), or if symptoms worsen or fail to improve.   Continue healthy lifestyle choices, including diet (rich in fruits, vegetables, and lean proteins, and low in salt and simple carbohydrates) and exercise (at least 30 minutes of moderate physical activity daily).  Educational handout given for chest wall pain  The above assessment and management plan was discussed with the patient. The patient verbalized understanding of and has agreed to the management plan. Patient is aware to call the clinic if they develop any new symptoms or if symptoms persist or worsen. Patient is aware when to return to the clinic for a follow-up visit. Patient educated on when it is appropriate to go to the emergency department.   Monia Pouch, FNP-C [facility] [phone]

## 2021-09-27 ENCOUNTER — Ambulatory Visit (INDEPENDENT_AMBULATORY_CARE_PROVIDER_SITE_OTHER): Payer: Medicare Other | Admitting: *Deleted

## 2021-09-27 DIAGNOSIS — Z9181 History of falling: Secondary | ICD-10-CM

## 2021-09-27 DIAGNOSIS — M8949 Other hypertrophic osteoarthropathy, multiple sites: Secondary | ICD-10-CM

## 2021-09-27 DIAGNOSIS — I10 Essential (primary) hypertension: Secondary | ICD-10-CM

## 2021-09-27 DIAGNOSIS — M159 Polyosteoarthritis, unspecified: Secondary | ICD-10-CM

## 2021-09-27 NOTE — Chronic Care Management (AMB) (Signed)
Chronic Care Management   CCM RN Visit Note  09/27/2021 Name: Megan Chapman MRN: [id number] DOB: 1931/07/11  Subjective: Megan Chapman is a 87 y.o. year old female who is a primary care patient of Stacks, Megan Gash, MD. The care management team was consulted for assistance with disease management and care coordination needs.    Engaged with patient by telephone for follow up visit in response to provider referral for case management and/or care coordination services.   Consent to Services:  The patient was given information about Chronic Care Management services, agreed to services, and gave verbal consent prior to initiation of services.  Please see initial visit note for detailed documentation.   Patient agreed to services and verbal consent obtained.   Assessment: Review of patient past medical history, allergies, medications, health status, including review of consultants reports, laboratory and other test data, was performed as part of comprehensive evaluation and provision of chronic care management services.   SDOH (Social Determinants of Health) assessments and interventions performed:    CCM Care Plan  Allergies  Allergen Reactions   Amoxicillin    Biaxin [Clarithromycin]    Ciprofloxacin Nausea Only   Codeine    Nitrofurantoin Nausea And Vomiting and Nausea Only   Sulfa Antibiotics     Outpatient Encounter Medications as of 09/27/2021  Medication Sig   ALPRAZolam (XANAX) 0.5 MG tablet Take 1 tablet (0.5 mg total) by mouth at bedtime as needed. for sleep   atorvastatin (LIPITOR) 40 MG tablet TAKE 1 TABLET DAILY   Cholecalciferol (VITAMIN D3) 1000 UNITS CAPS Take 2 capsules (2,000 Units total) by mouth daily.   DULoxetine (CYMBALTA) 30 MG capsule Take 1 capsule (30 mg total) by mouth daily. for arthritis   levothyroxine (SYNTHROID) 25 MCG tablet TAKE 1 TABLET DAILY BEFORE BREAKFAST   Omega-3 Fatty Acids (FISH OIL) 1200 MG CAPS Take 2 capsules by mouth daily.   pantoprazole  (PROTONIX) 40 MG tablet Take 40 mg by mouth 2 (two) times daily. 30 minutes before lunch and 30 minutes before dinner   No facility-administered encounter medications on file as of 09/27/2021.    Patient Active Problem List   Diagnosis Date Noted   COVID-19 virus RNA test result positive at limit of detection 08/29/2021   Palpitations 07/15/2019   Essential hypertension 07/15/2019   Osteoarthritis 04/17/2017   Hypothyroidism 10/11/2015   Vitamin D deficiency 10/11/2015   Hyperlipidemia 06/08/2013    Conditions to be addressed/monitored:HTN and osteoarthritis with chronic pain and high risk for falls  Care Plan : [place]  Updates made by Ilean China, RN since 09/27/2021 12:00 AM     Problem: Chronic Disease Management Needs   Priority: Medium     Long-Range Goal: Work with RN Care Manager Regarding Care Coordination and Care Management Associated with Hypertension and Osteoarthritis   This Visit's Progress: On track  Priority: Medium  Note:   Current Barriers:  Chronic Disease Management support and education needs related to HTN and osteoarthritis  RNCM Clinical Goal(s):  Patient will take all medications exactly as prescribed and will call provider for medication related questions continue to work with RN Care Manager to address care management and care coordination needs related to HTN and Osteoarthritis  through collaboration with RN Care manager, provider, and care team.   Interventions: 1:1 collaboration with primary care provider regarding development and update of comprehensive plan of care as evidenced by provider attestation and co-signature Inter-disciplinary care team collaboration (see longitudinal plan of care) Evaluation  of current treatment plan related to  self management and patient's adherence to plan as established by provider Provided with RNCM contact information and encouraged to reach out as needed   Falls:  (Status: New goal.) Provided  written and verbal education re: potential causes of falls and Fall prevention strategies Reviewed medications and discussed potential side effects of medications such as dizziness and frequent urination Advised patient of importance of notifying provider of falls Assessed for signs and symptoms of orthostatic hypotension Assessed for falls since last encounter Assessed patients knowledge of fall risk prevention secondary to previously provided education Provided patient information for fall alert systems Assessed social determinant of health barriers Assessed for use of assistive devices. Has a cane and walker. Does not use cane but uses walker at times Encouraged to keep phone with her at all times Discussed recent fall and negative physical exam and x-rays Encouraged to reach out to PCP with any new or worsening symptoms Discussed family/social support Discussed recent Covid infection and associated symptoms/weakness as possible cause of prior fall  Hypertension: (Status: Goal on track: YES.) Last practice recorded BP readings:  BP Readings from Last 3 Encounters:  09/26/21 134/84  06/06/21 (!) 156/74  03/03/21 129/61  Most recent eGFR/CrCl:  Lab Results  Component Value Date   EGFR 47 (L) 06/06/2021    No components found for: CRCL  Evaluation of current treatment plan related to hypertension self management and patient's adherence to plan as established by provider;   Reviewed medications with patient and discussed importance of compliance;  Counseled on the importance of exercise goals with target of 150 minutes per week Discussed plans with patient for ongoing care management follow up and provided patient with direct contact information for care management team; Advised patient, providing education and rationale, to monitor blood pressure daily and record, calling PCP for findings outside established parameters;  Reviewed scheduled/upcoming provider appointments including:   Provided education on prescribed diet Low sodium/DASH diet;  Discussed complications of poorly controlled blood pressure such as heart disease, stroke, circulatory complications, vision complications, kidney impairment, sexual dysfunction;   Pain:  (Status: Goal on track: YES.) Pain assessment performed Medications reviewed. Tolerating Cymbalta and it is helping her rest better. Has noticed weight loss of a few lbs.  Discussed appetite and current diet and advised that decreased appetite can be a side effect of Cymbalta. Patient feels that she is eating adequately.  Encouraged to monitor weight and to report any continued weight loss to PCP  Chart reviewed and weight has fluctuated between 150 and 140 over the past 18 months Reviewed provider established plan for pain management; Discussed importance of adherence to all scheduled medical appointments; Counseled on the importance of reporting any/all new or changed pain symptoms or management strategies to pain management provider; Advised patient to report to care team affect of pain on daily activities; Reviewed with patient prescribed pharmacological and nonpharmacological pain relief strategies; Screening for signs and symptoms of depression related to chronic disease state;  Discussed recent SI joint injection. Pain is somewhat improved but patient isn't sure that shot was placed in the area that is causing the most discomfort Discussed potential effectiveness of joint injections and frequency Reviewed upcoming appointments  Patient Goals/Self-Care Activities: Patient will self administer medications as prescribed as evidenced by self report/primary caregiver report  Patient will attend all scheduled provider appointments as evidenced by clinician review of documented attendance to scheduled appointments and patient/caregiver report Patient will continue to perform ADL's independently  as evidenced by patient/caregiver report Patient will  continue to perform IADL's independently as evidenced by patient/caregiver report Patient will call provider office for new concerns or questions as evidenced by review of documented incoming telephone call notes and patient report       Plan:Telephone follow up appointment with care management team member scheduled for:  10/10/21 with RNCM and The patient has been provided with contact information for the care management team and has been advised to call with any health related questions or concerns.   Chong Sicilian, BSN, RN-BC Embedded Chronic Care Manager [facility] [place] [facility] Direct Dial: [phone]

## 2021-09-27 NOTE — Patient Instructions (Signed)
Visit Information  PATIENT GOALS:  Goals Addressed             This Visit's Progress    Falls Prevented   On track    Timeframe:  Long-Range Goal Priority:  High Start Date: 09/27/21 End Date:    09/27/22                   Follow-up: 10/10/21  Move carefully and change positions slowly to avoid falls Wear non-slip shoes Keep a clear path through your home Rest as needed Use cane or walker  Seek appropriate medical attention if you do fall Consider a Life Alert system Keep phone with you at all times Call [facility] as needed [phone]      Manage Pain-Osteoarthritis   On track    Timeframe:  Long-Range Goal Priority:  Medium Start Date:                             Expected End Date:                       Follow Up Date 10/10/21   plan exercise or activity when pain is best controlled track what makes the pain worse and what makes it better Tylenol or Tylenol Arthritis for pain relief Take Cymbalta as prescribed and notify PCP of any side effects Do not take any NSAIDs (Ibuprofen, aleve, diclofenac, etc) use ice or heat for pain relief  keep all medical appointments call PCP or ortho with any new or worsening symptoms   Why is this important?   Day-to-day life can be hard when you have joint pain.  Pain medicine is just one piece of the treatment puzzle. There are many things you can do to manage pain and stay strong.   Lifestyle changes like stopping smoking and eating foods with Vitamin D and calcium keeps your bones and muscles healthy. Your joints are better when supported by strong muscles.  You can try these action steps to help you manage your pain.     Notes:      Track and Manage My Blood Pressure-Hypertension   On track    Timeframe:  Long-Range Goal Priority:  Medium Start Date:                             Expected End Date:                       Follow Up Date 10/10/2021    - check blood pressure 3 times per week - write blood pressure  results in a log or diary  - bring log to PCP appointment - call PCP with any readings outside of recommended range - take medications as prescribed   Why is this important?   You won't feel high blood pressure, but it can still hurt your blood vessels.  High blood pressure can cause heart or kidney problems. It can also cause a stroke.  Making lifestyle changes like losing a little weight or eating less salt will help.  Checking your blood pressure at home and at different times of the day can help to control blood pressure.  If the doctor prescribes medicine remember to take it the way the doctor ordered.  Call the office if you cannot afford the medicine or if there are questions about it.  Notes:         Patient verbalizes understanding of instructions provided today and agrees to view in [place].   Plan:Telephone follow up appointment with care management team member scheduled for:  10/10/21 with RNCM and The patient has been provided with contact information for the care management team and has been advised to call with any health related questions or concerns.   Chong Sicilian, BSN, RN-BC Embedded Chronic Care Manager [facility] [place] Management Direct Dial: [phone]

## 2021-09-29 DIAGNOSIS — I10 Essential (primary) hypertension: Secondary | ICD-10-CM | POA: Diagnosis not present

## 2021-09-29 DIAGNOSIS — M8949 Other hypertrophic osteoarthropathy, multiple sites: Secondary | ICD-10-CM | POA: Diagnosis not present

## 2021-10-02 ENCOUNTER — Other Ambulatory Visit: Payer: Self-pay | Admitting: Family Medicine

## 2021-10-10 ENCOUNTER — Telehealth: Payer: Self-pay | Admitting: *Deleted

## 2021-10-10 ENCOUNTER — Telehealth: Payer: Medicare Other

## 2021-10-10 NOTE — Telephone Encounter (Signed)
  Care Management   Follow Up Note   10/10/2021 Name: Megan Chapman MRN: [id number] DOB: [birth year]   Referred by: [name], MD Reason for referral : Chronic Care Management (RN follow up)   An unsuccessful telephone outreach was attempted today. The patient was referred to the case management team for assistance with care management and care coordination.   Follow Up Plan: A HIPPA compliant phone message was left for the patient providing contact information and requesting a return call. Forwarding to [place] for outreach and rescheduling.   Chong Sicilian, BSN, RN-BC Embedded Chronic Care Manager [facility] Management Direct Dial: [phone]

## 2021-10-20 ENCOUNTER — Telehealth: Payer: Self-pay | Admitting: *Deleted

## 2021-10-20 ENCOUNTER — Telehealth: Payer: Medicare Other

## 2021-10-20 NOTE — Telephone Encounter (Signed)
  Care Management   Follow Up Note   10/20/2021 Name: Megan Chapman MRN: [id number] DOB: 1931-06-17   Referred by: [name], MD Reason for referral : No chief complaint on file.   A second unsuccessful telephone outreach was attempted today. The patient was referred to the case management team for assistance with care management and care coordination.   Follow Up Plan: A HIPPA compliant phone message was left for the patient providing contact information and requesting a return call. Forwarding to [facility] Care Guide for outreach and rescheduling.   Chong Sicilian, BSN, RN-BC Embedded Chronic Care Manager [facility] Management Direct Dial: [phone]

## 2021-10-24 ENCOUNTER — Other Ambulatory Visit: Payer: Self-pay | Admitting: *Deleted

## 2021-10-24 DIAGNOSIS — M461 Sacroiliitis, not elsewhere classified: Secondary | ICD-10-CM | POA: Insufficient documentation

## 2021-10-24 DIAGNOSIS — G8929 Other chronic pain: Secondary | ICD-10-CM | POA: Insufficient documentation

## 2021-10-24 DIAGNOSIS — M159 Polyosteoarthritis, unspecified: Secondary | ICD-10-CM

## 2021-10-24 DIAGNOSIS — M545 Low back pain, unspecified: Secondary | ICD-10-CM | POA: Diagnosis not present

## 2021-10-24 DIAGNOSIS — F419 Anxiety disorder, unspecified: Secondary | ICD-10-CM

## 2021-10-24 MED ORDER — DULOXETINE HCL 30 MG PO CPEP: 30.0000 mg | ORAL_CAPSULE | Freq: Every day | ORAL | 0 refills | Status: DC

## 2021-10-30 DIAGNOSIS — H43813 Vitreous degeneration, bilateral: Secondary | ICD-10-CM | POA: Diagnosis not present

## 2021-10-30 DIAGNOSIS — H353121 Nonexudative age-related macular degeneration, left eye, early dry stage: Secondary | ICD-10-CM | POA: Diagnosis not present

## 2021-10-30 DIAGNOSIS — H26491 Other secondary cataract, right eye: Secondary | ICD-10-CM | POA: Diagnosis not present

## 2021-10-30 DIAGNOSIS — H35373 Puckering of macula, bilateral: Secondary | ICD-10-CM | POA: Diagnosis not present

## 2021-11-10 ENCOUNTER — Telehealth: Payer: Self-pay

## 2021-11-10 NOTE — Chronic Care Management (AMB) (Signed)
  Care Management   Note  11/10/2021 Name: Staphany Ditton MRN: [id number] DOB: [birth year]  Maame Dack is a 87 y.o. year old female who is a primary care patient of Stacks, Cletus Gash, MD and is actively engaged with the care management team. I reached out to [employer] by phone today to assist with re-scheduling a follow up visit with the RN Case Manager  Follow up plan: Unsuccessful telephone outreach attempt made. A HIPAA compliant phone message was left for the patient providing contact information and requesting a return call.  The care management team will reach out to the patient again over the next 7 days.  If patient returns call to provider office, please advise to call [facility]  at [place] [place], [place], [place], [place] [address] Direct Dial: [phone] [email or web address]: [email or web address]

## 2021-11-15 DIAGNOSIS — M461 Sacroiliitis, not elsewhere classified: Secondary | ICD-10-CM | POA: Diagnosis not present

## 2021-11-15 DIAGNOSIS — M533 Sacrococcygeal disorders, not elsewhere classified: Secondary | ICD-10-CM | POA: Diagnosis not present

## 2021-11-22 NOTE — Chronic Care Management (AMB) (Signed)
  Care Management   Note  11/22/2021 Name: Shalva Rozycki MRN: [id number] DOB: 07/18/1931  Lavette Yankovich is a 87 y.o. year old female who is a primary care patient of Stacks, Cletus Gash, MD and is actively engaged with the care management team. I reached out to [employer] by phone today to assist with re-scheduling a follow up visit with the RN Case Manager  Follow up plan: Unsuccessful telephone outreach attempt made. A HIPAA compliant phone message was left for the patient providing contact information and requesting a return call.  The care management team will reach out to the patient again over the next 7 days.  If patient returns call to provider office, please advise to call [facility]  at [place], [place], [place],  [address] Direct Dial: [phone] [email or web address] Website: [email or web address]

## 2021-12-05 NOTE — Chronic Care Management (AMB) (Signed)
  Care Management   Note  12/05/2021 Name: Megan Chapman MRN: [id number] DOB: 23-Feb-1931  Megan Chapman is a 87 y.o. year old female who is a primary care patient of Stacks, Cletus Gash, MD and is actively engaged with the care management team. I reached out to [employer] by phone today to assist with re-scheduling a follow up visit with the RN Case Manager  Follow up plan: Telephone appointment with care management team member scheduled for:01/29/2022   Noreene Lar[address], [place], [place], [place] [address] Direct Dial: [phone] [email or web address] Website: [email or web address]

## 2021-12-06 ENCOUNTER — Ambulatory Visit (INDEPENDENT_AMBULATORY_CARE_PROVIDER_SITE_OTHER): Payer: Medicare Other | Admitting: Family Medicine

## 2021-12-06 ENCOUNTER — Encounter: Payer: Self-pay | Admitting: Family Medicine

## 2021-12-06 VITALS — BP 160/68 | HR 68 | Temp 97.7°F | Resp 20 | Ht 62.0 in | Wt 142.0 lb

## 2021-12-06 DIAGNOSIS — E559 Vitamin D deficiency, unspecified: Secondary | ICD-10-CM | POA: Diagnosis not present

## 2021-12-06 DIAGNOSIS — Z23 Encounter for immunization: Secondary | ICD-10-CM | POA: Diagnosis not present

## 2021-12-06 DIAGNOSIS — E038 Other specified hypothyroidism: Secondary | ICD-10-CM

## 2021-12-06 DIAGNOSIS — E782 Mixed hyperlipidemia: Secondary | ICD-10-CM

## 2021-12-06 DIAGNOSIS — M159 Polyosteoarthritis, unspecified: Secondary | ICD-10-CM

## 2021-12-06 DIAGNOSIS — I10 Essential (primary) hypertension: Secondary | ICD-10-CM

## 2021-12-06 LAB — CBC WITH DIFFERENTIAL/PLATELET
Basophils Absolute: 0 10*3/uL (ref 0.0–0.2)
Basos: 0 %
EOS (ABSOLUTE): 0 10*3/uL (ref 0.0–0.4)
Eos: 1 %
Hematocrit: 36.8 % (ref 34.0–46.6)
Hemoglobin: 12.2 g/dL (ref 11.1–15.9)
Immature Grans (Abs): 0 10*3/uL (ref 0.0–0.1)
Immature Granulocytes: 1 %
Lymphocytes Absolute: 1.1 10*3/uL (ref 0.7–3.1)
Lymphs: 24 %
MCH: 30.3 pg (ref 26.6–33.0)
MCHC: 33.2 g/dL (ref 31.5–35.7)
MCV: 91 fL (ref 79–97)
Monocytes Absolute: 0.8 10*3/uL (ref 0.1–0.9)
Monocytes: 18 %
Neutrophils Absolute: 2.7 10*3/uL (ref 1.4–7.0)
Neutrophils: 56 %
Platelets: 213 10*3/uL (ref 150–450)
RBC: 4.03 x10E6/uL (ref 3.77–5.28)
RDW: 13.6 % (ref 11.7–15.4)
WBC: 4.6 10*3/uL (ref 3.4–10.8)

## 2021-12-06 LAB — CMP14+EGFR
ALT: 14 IU/L (ref 0–32)
AST: 16 IU/L (ref 0–40)
Albumin/Globulin Ratio: 2 (ref 1.2–2.2)
Albumin: 4.3 g/dL (ref 3.6–4.6)
Alkaline Phosphatase: 59 IU/L (ref 44–121)
BUN/Creatinine Ratio: 21 (ref 12–28)
BUN: 18 mg/dL (ref 8–27)
Bilirubin Total: 0.5 mg/dL (ref 0.0–1.2)
CO2: 25 mmol/L (ref 20–29)
Calcium: 9.7 mg/dL (ref 8.7–10.3)
Chloride: 100 mmol/L (ref 96–106)
Creatinine, Ser: 0.86 mg/dL (ref 0.57–1.00)
Globulin, Total: 2.2 g/dL (ref 1.5–4.5)
Glucose: 87 mg/dL (ref 70–99)
Potassium: 4.8 mmol/L (ref 3.5–5.2)
Sodium: 135 mmol/L (ref 134–144)
Total Protein: 6.5 g/dL (ref 6.0–8.5)
eGFR: 65 mL/min/{1.73_m2} (ref >59)

## 2021-12-06 LAB — TSH+FREE T4
Free T4: 1.19 ng/dL (ref 0.82–1.77)
TSH: 2.64 u[IU]/mL (ref 0.450–4.500)

## 2021-12-06 NOTE — Progress Notes (Signed)
Subjective:  Patient ID: Megan Chapman, female    DOB: 06-Apr-1931  Age: 87 y.o. MRN: [id number]  CC: Medical Management of Chronic Issues (6 mo )   HPI Megan Chapman presents for  presents for  follow-up of hypertension. Patient has no history of headache chest pain or shortness of breath or recent cough. Patient also denies symptoms of TIA such as focal numbness or weakness. Patient denies side effects from medication. States taking it regularly. Bp up today due to son staying with her last night. Has cancer. Stressful helping him. Usually in the 836O systolic.    follow-up on  thyroid. The patient has a history of hypothyroidism for many years. It has been stable recently. Pt. denies any change in  voice, loss of hair, heat or cold intolerance. Energy level has been adequate to good. Patient denies constipation and diarrhea. No myxedema. Medication is as noted below. Verified that pt is taking it daily on an empty stomach. Well tolerated.  Back pain 7/10 day in & day out. Flares with change of positiion up & down. Right lower bavk radiating down posterolateral thigh   Depression screen [facility] 2/9 12/06/2021 09/26/2021 07/07/2021  Decreased Interest 0 0 0  Down, Depressed, Hopeless 0 0 0  PHQ - 2 Score 0 0 0  Altered sleeping - 0 -  Tired, decreased energy - 0 -  Change in appetite - 0 -  Feeling bad or failure about yourself  - 0 -  Trouble concentrating - 0 -  Moving slowly or fidgety/restless - 0 -  Suicidal thoughts - 0 -  PHQ-9 Score - 0 -  Difficult doing work/chores - - -  Some recent data might be hidden    History Megan Chapman has a past medical history of Allergy, Anxiety, Arthritis, Asthma, Basal cell carcinoma, Cataract, GERD (gastroesophageal reflux disease), HTN (hypertension), Hyperlipidemia, Migraine, Palpitations, and Thyroid disease.   Megan Chapman has a past surgical history that includes Partial knee arthroplasty (Left); Abdominal hysterectomy; Meniscus repair (Right); Excision basal  cell carcinoma (2017); Cataract extraction (Bilateral); Appendectomy; Tonsillectomy; and Colonoscopy.   Her family history includes COPD in her son; Cancer in her brother; Cancer - Colon in her mother; Colon cancer in her mother; Down syndrome in her daughter; Migraines in her brother and son; Prostate cancer in her father; Stroke in her brother.Megan Chapman reports that Megan Chapman has never smoked. Megan Chapman has never used smokeless tobacco. Megan Chapman reports that Megan Chapman does not drink alcohol and does not use drugs.    ROS Review of Systems  Constitutional: Negative.   HENT: Negative.    Eyes:  Negative for visual disturbance.  Respiratory:  Negative for shortness of breath.   Cardiovascular:  Negative for chest pain.  Gastrointestinal:  Negative for abdominal pain.  Musculoskeletal:  Negative for arthralgias.   Objective:  BP (!) 160/68   Pulse 68   Temp 97.7 F (36.5 C)   Resp 20   Ht 5\' 2"  (1.575 m)   Wt 142 lb (64.4 kg)   SpO2 99%   BMI 25.97 kg/m   BP Readings from Last 3 Encounters:  12/06/21 (!) 160/68  09/26/21 134/84  06/06/21 (!) 156/74    Wt Readings from Last 3 Encounters:  12/06/21 142 lb (64.4 kg)  09/26/21 140 lb (63.5 kg)  06/06/21 142 lb 9.6 oz (64.7 kg)     Physical Exam Constitutional:      General: Megan Chapman is not in acute distress.    Appearance: Megan Chapman is well-developed.  Cardiovascular:     Rate and Rhythm: Normal rate and regular rhythm.  Pulmonary:     Breath sounds: Normal breath sounds.  Musculoskeletal:        General: Normal range of motion.  Skin:    General: Skin is warm and dry.  Neurological:     Mental Status: Megan Chapman is alert and oriented to person, place, and time.      Assessment & Plan:   Megan Chapman was seen today for medical management of chronic issues.  Diagnoses and all orders for this visit:  Essential hypertension  Other specified hypothyroidism  Primary osteoarthritis involving multiple joints  Vitamin D deficiency  Mixed  hyperlipidemia      I am having Megan Chapman maintain her Vitamin D3, Fish Oil, pantoprazole, ALPRAZolam, atorvastatin, levothyroxine, and DULoxetine.  Allergies as of 12/06/2021       Reactions   Amoxicillin    Biaxin [clarithromycin]    Ciprofloxacin Nausea Only   Codeine    Nitrofurantoin Nausea And Vomiting, Nausea Only   Sulfa Antibiotics         Medication List        Accurate as of December 06, 2021  9:03 AM. If you have any questions, ask your nurse or doctor.          ALPRAZolam 0.5 MG tablet Commonly known as: XANAX Take 1 tablet (0.5 mg total) by mouth at bedtime as needed. for sleep   atorvastatin 40 MG tablet Commonly known as: LIPITOR TAKE 1 TABLET DAILY   DULoxetine 30 MG capsule Commonly known as: Cymbalta Take 1 capsule (30 mg total) by mouth daily. for arthritis   Fish Oil 1200 MG Caps Take 2 capsules by mouth daily.   levothyroxine 25 MCG tablet Commonly known as: SYNTHROID TAKE 1 TABLET DAILY BEFORE BREAKFAST   pantoprazole 40 MG tablet Commonly known as: PROTONIX Take 40 mg by mouth 2 (two) times daily. 30 minutes before lunch and 30 minutes before dinner   Vitamin D3 25 MCG (1000 UT) Caps Take 2 capsules (2,000 Units total) by mouth daily.         Follow-up: Return in about 6 months (around 06/06/2022).  Megan Chapman, M.D.

## 2021-12-10 NOTE — Progress Notes (Signed)
Hello Abeni,  Your lab result is normal and/or stable.Some minor variations that are not significant are commonly marked abnormal, but do not represent any medical problem for you.  Best regards, Rayen Dafoe, M.D.

## 2022-01-22 ENCOUNTER — Other Ambulatory Visit: Payer: Self-pay | Admitting: Family Medicine

## 2022-01-22 DIAGNOSIS — M159 Polyosteoarthritis, unspecified: Secondary | ICD-10-CM

## 2022-01-22 DIAGNOSIS — F419 Anxiety disorder, unspecified: Secondary | ICD-10-CM

## 2022-01-29 ENCOUNTER — Telehealth: Payer: Medicare Other

## 2022-02-01 ENCOUNTER — Telehealth: Payer: Self-pay | Admitting: *Deleted

## 2022-02-01 ENCOUNTER — Telehealth: Payer: Medicare Other | Admitting: *Deleted

## 2022-02-01 NOTE — Telephone Encounter (Signed)
°  Care Management   Follow Up Note   02/01/2022 Name: Jeannett Dekoning MRN: [id number] DOB: 1931/04/22   Referred by: [name], MD Reason for referral : Chronic Care Management (Unsuccessful telephone follow-up)   An unsuccessful telephone outreach was attempted today. The patient was referred to the case management team for assistance with care management and care coordination.    Follow Up Plan: A HIPPA compliant phone message was left for the patient providing contact information and requesting a return call. Forwarding to [facility] Care Guide for outreach and rescheduling.   Chong Sicilian, BSN, RN-BC Embedded Chronic Care Manager [facility] Management Direct Dial: [phone]

## 2022-02-05 ENCOUNTER — Telehealth: Payer: Self-pay

## 2022-02-05 NOTE — Chronic Care Management (AMB) (Signed)
°  Care Management   Note  02/05/2022 Name: [name] MRN: [id number] DOB: 07-19-1931  Megan Chapman is a 88 y.o. year old female who is a primary care patient of Stacks, Cletus Gash, MD and is actively engaged with the care management team. I reached out to [employer] by phone today to assist with re-scheduling a follow up visit with the RN Case Manager  Follow up plan: Unsuccessful telephone outreach attempt made. A HIPAA compliant phone message was left for the patient providing contact information and requesting a return call.  The care management team will reach out to the patient again over the next 7 days.  If patient returns call to provider office, please advise to call [facility]  at [place], [place], [place], [place] [address] Direct Dial: [phone] [email or web address] Website: [email or web address]

## 2022-02-09 NOTE — Chronic Care Management (AMB) (Signed)
°  Care Management   Note  02/09/2022 Name: Sonyia Muro MRN: [id number] DOB: 1931-03-03  Rylie Limburg is a 88 y.o. year old female who is a primary care patient of Stacks, Cletus Gash, MD and is actively engaged with the care management team. I reached out to [employer] by phone today to assist with re-scheduling a follow up visit with the RN Case Manager  Follow up plan: Unsuccessful telephone outreach attempt made. A HIPAA compliant phone message was left for the patient providing contact information and requesting a return call.  The care management team will reach out to the patient again over the next 7 days.  If patient returns call to provider office, please advise to call [facility]  at [place], [place], [place], [place] [address] Direct Dial: [phone] [email or web address] Website: [email or web address]

## 2022-02-09 NOTE — Chronic Care Management (AMB) (Signed)
°  Care Management   Note  02/09/2022 Name: Nayara Taplin MRN: [id number] DOB: April 06, 1931  Tyyne Cliett is a 88 y.o. year old female who is a primary care patient of Stacks, Cletus Gash, MD and is actively engaged with the care management team. I reached out to [employer] by phone today to assist with re-scheduling a follow up visit with the RN Case Manager  Follow up plan: Telephone appointment with care management team member scheduled for:02/14/2022  Noreene Larsson, [place], [place], [place] [address] Direct Dial: [phone] [email or web address] Website: [email or web address]

## 2022-02-11 IMAGING — DX DG RIBS W/ CHEST 3+V*L*
3 series · 3 of 3 positions shown · non-contrast
Comparison: Chest radiograph 12/06/2020

CLINICAL DATA: Left-sided pain following fall

EXAM:
LEFT RIBS AND CHEST - 3+ VIEW

[rib obl]
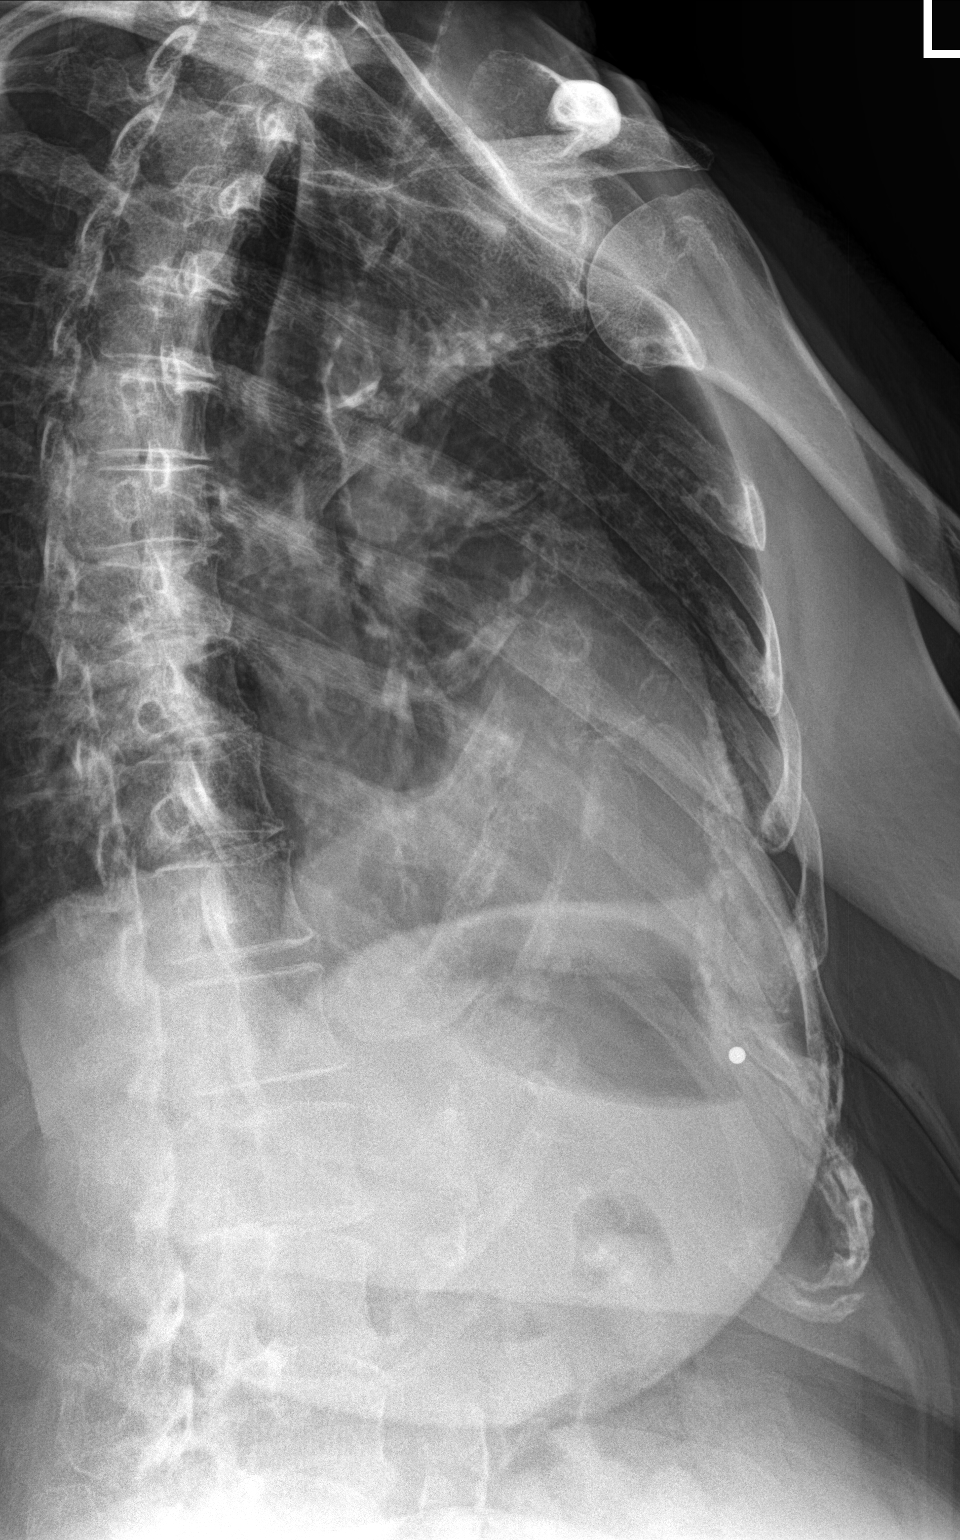

[chest ap]
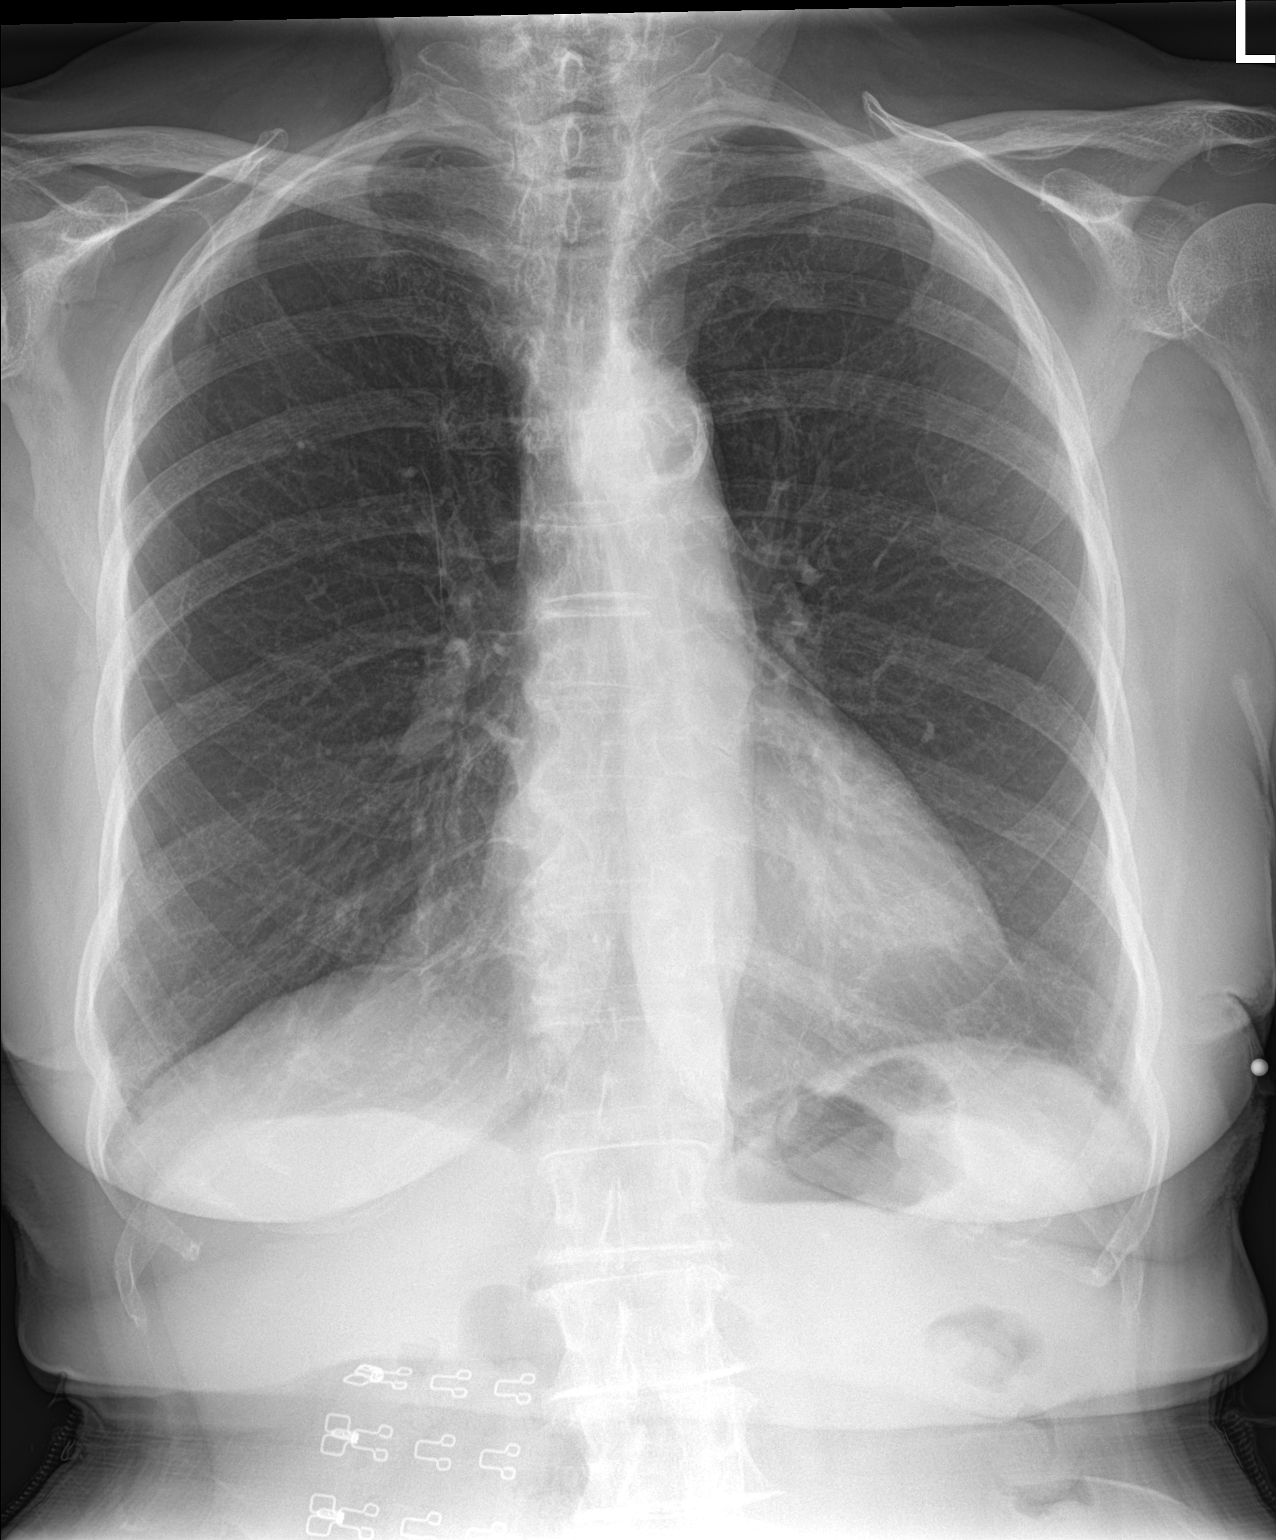

[rib ap]
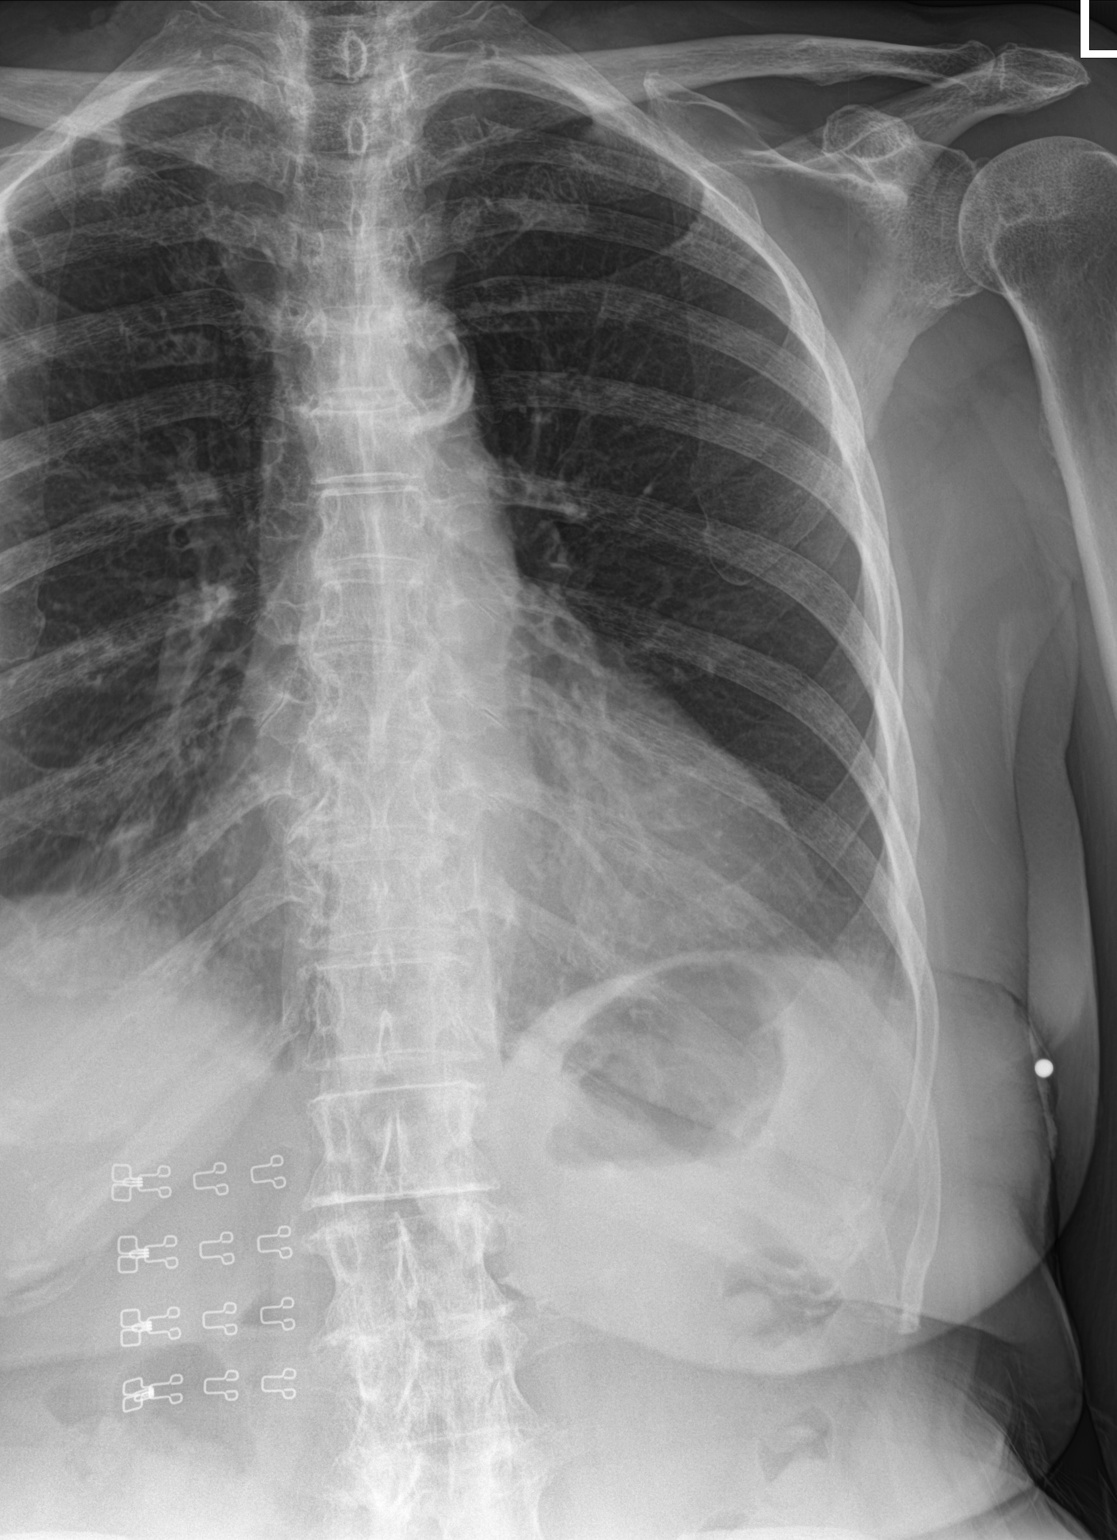

[3 of 3 positions shown; findings below may reference images not displayed]

FINDINGS: The cardiomediastinal silhouette is stable. There is calcified
atherosclerotic plaque of the aortic arch. The lungs are clear, with
no focal consolidation or pulmonary edema. There is no pleural
effusion or pneumothorax.

No displaced rib fracture is identified.
IMPRESSION: No rib fracture identified.

## 2022-02-11 IMAGING — DX DG SHOULDER 2+V*L*
3 series · 3 of 3 positions shown · non-contrast
Comparison: None.

CLINICAL DATA: Left shoulder and rib pain left shoulder and rib
pain

EXAM:
LEFT SHOULDER - 2+ VIEW

[shoulder ap (1 of 3)]
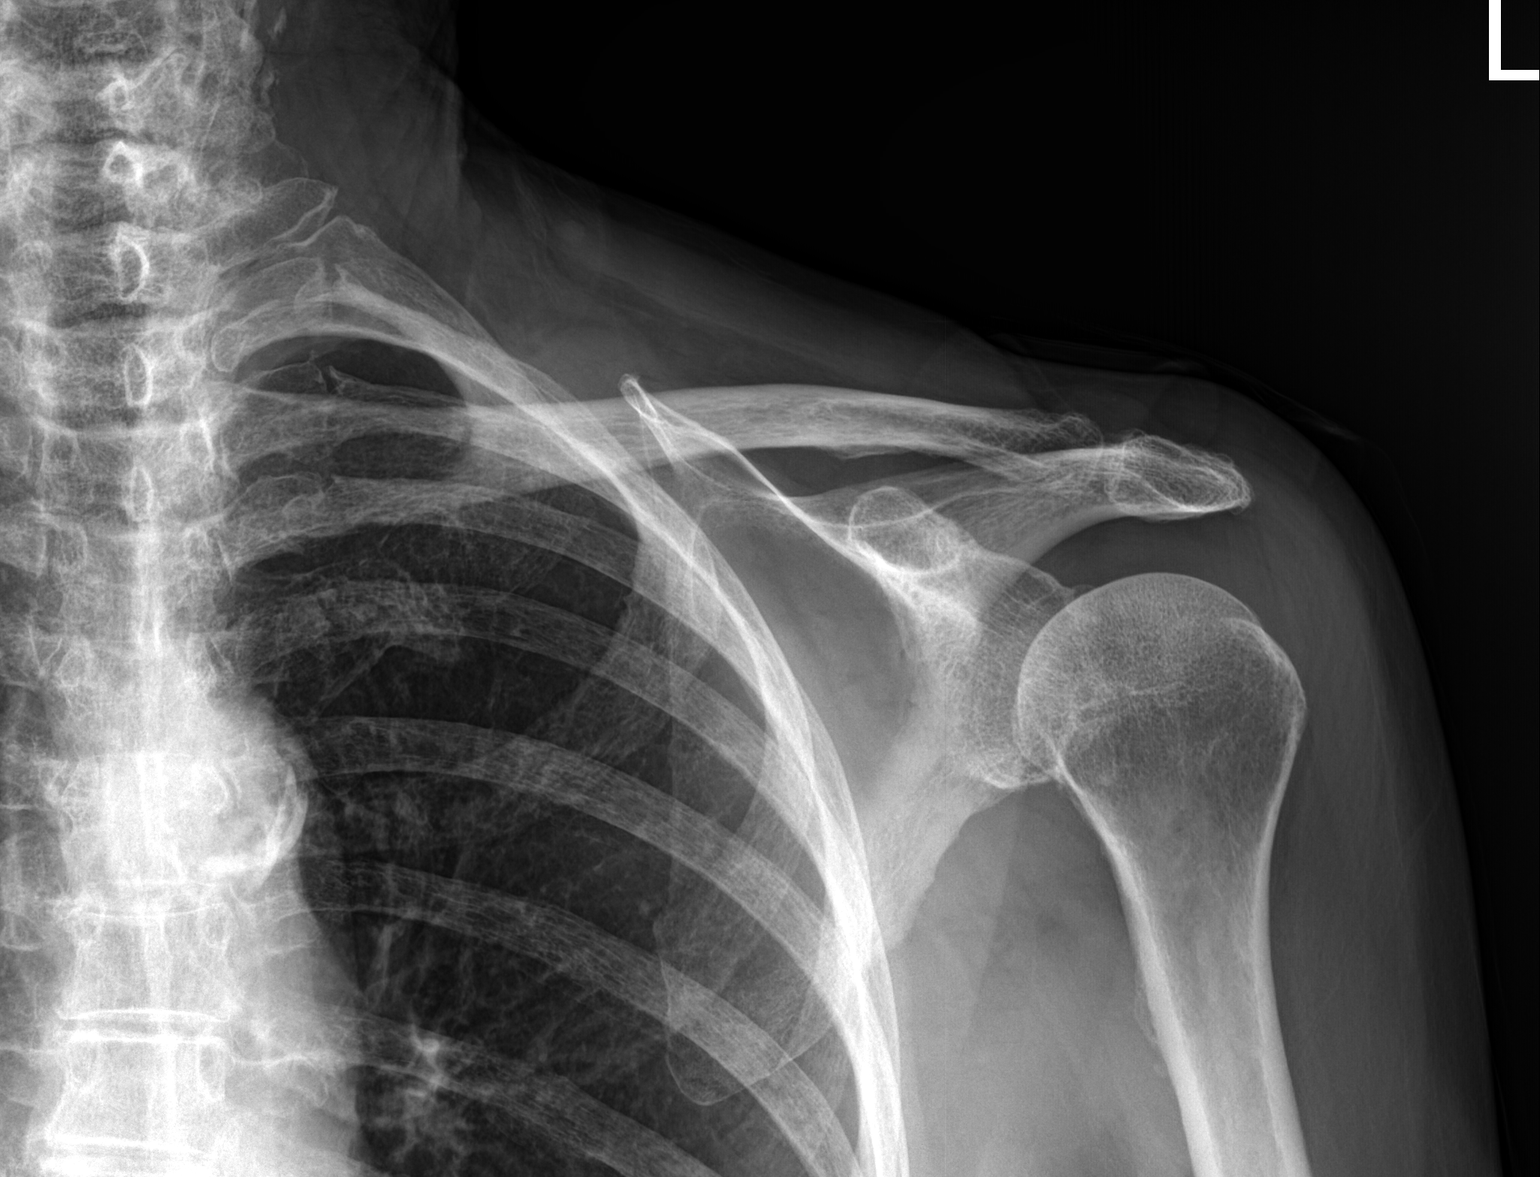

[shoulder ap (2 of 3)]
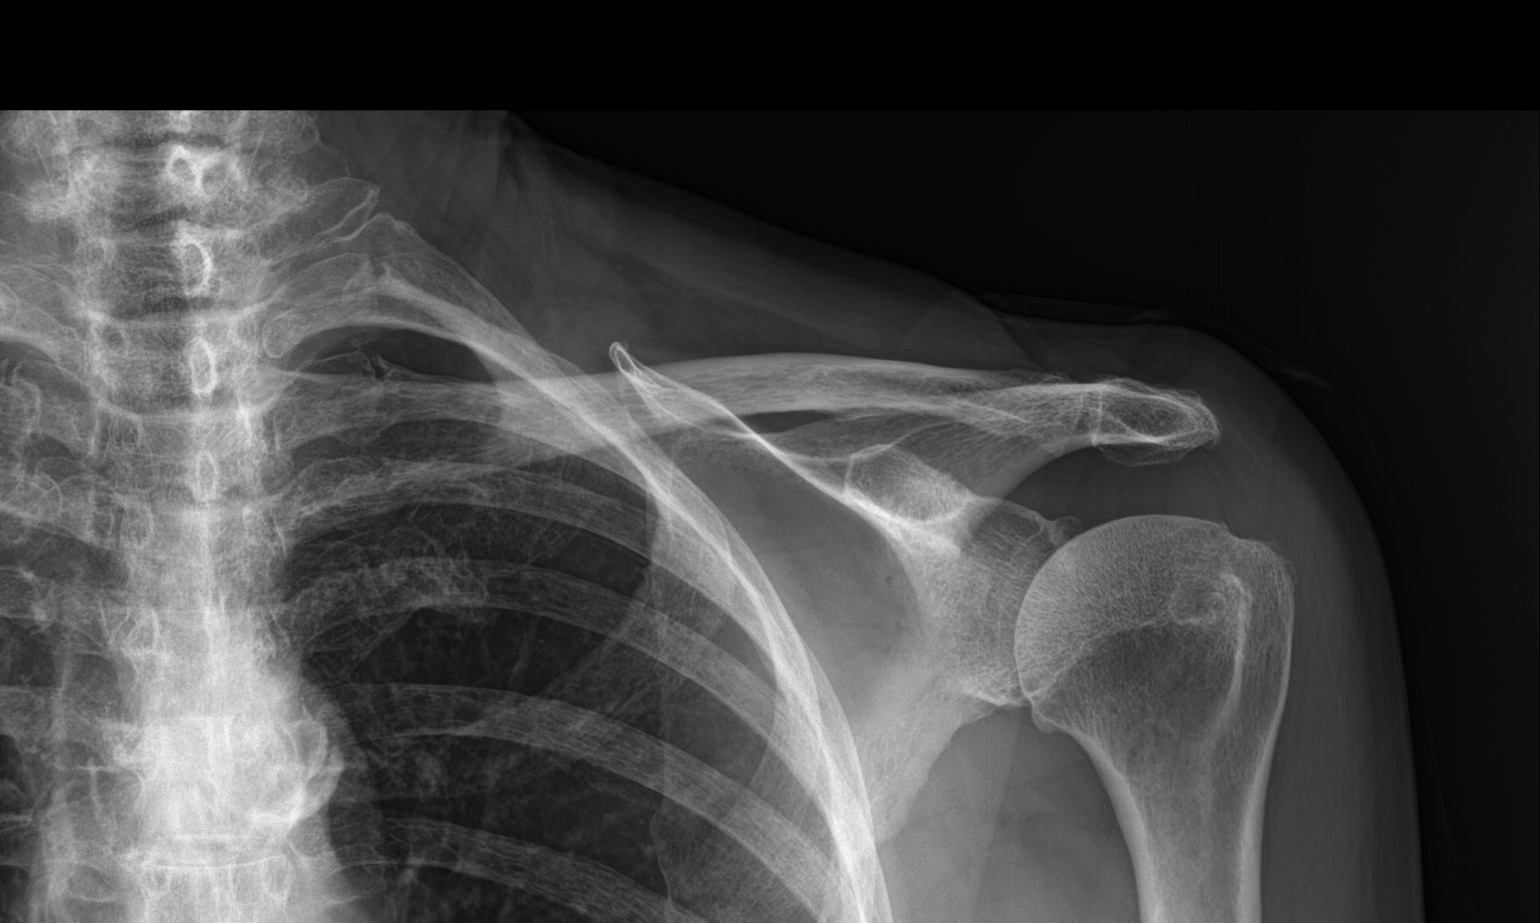

[shoulder ap (3 of 3)]
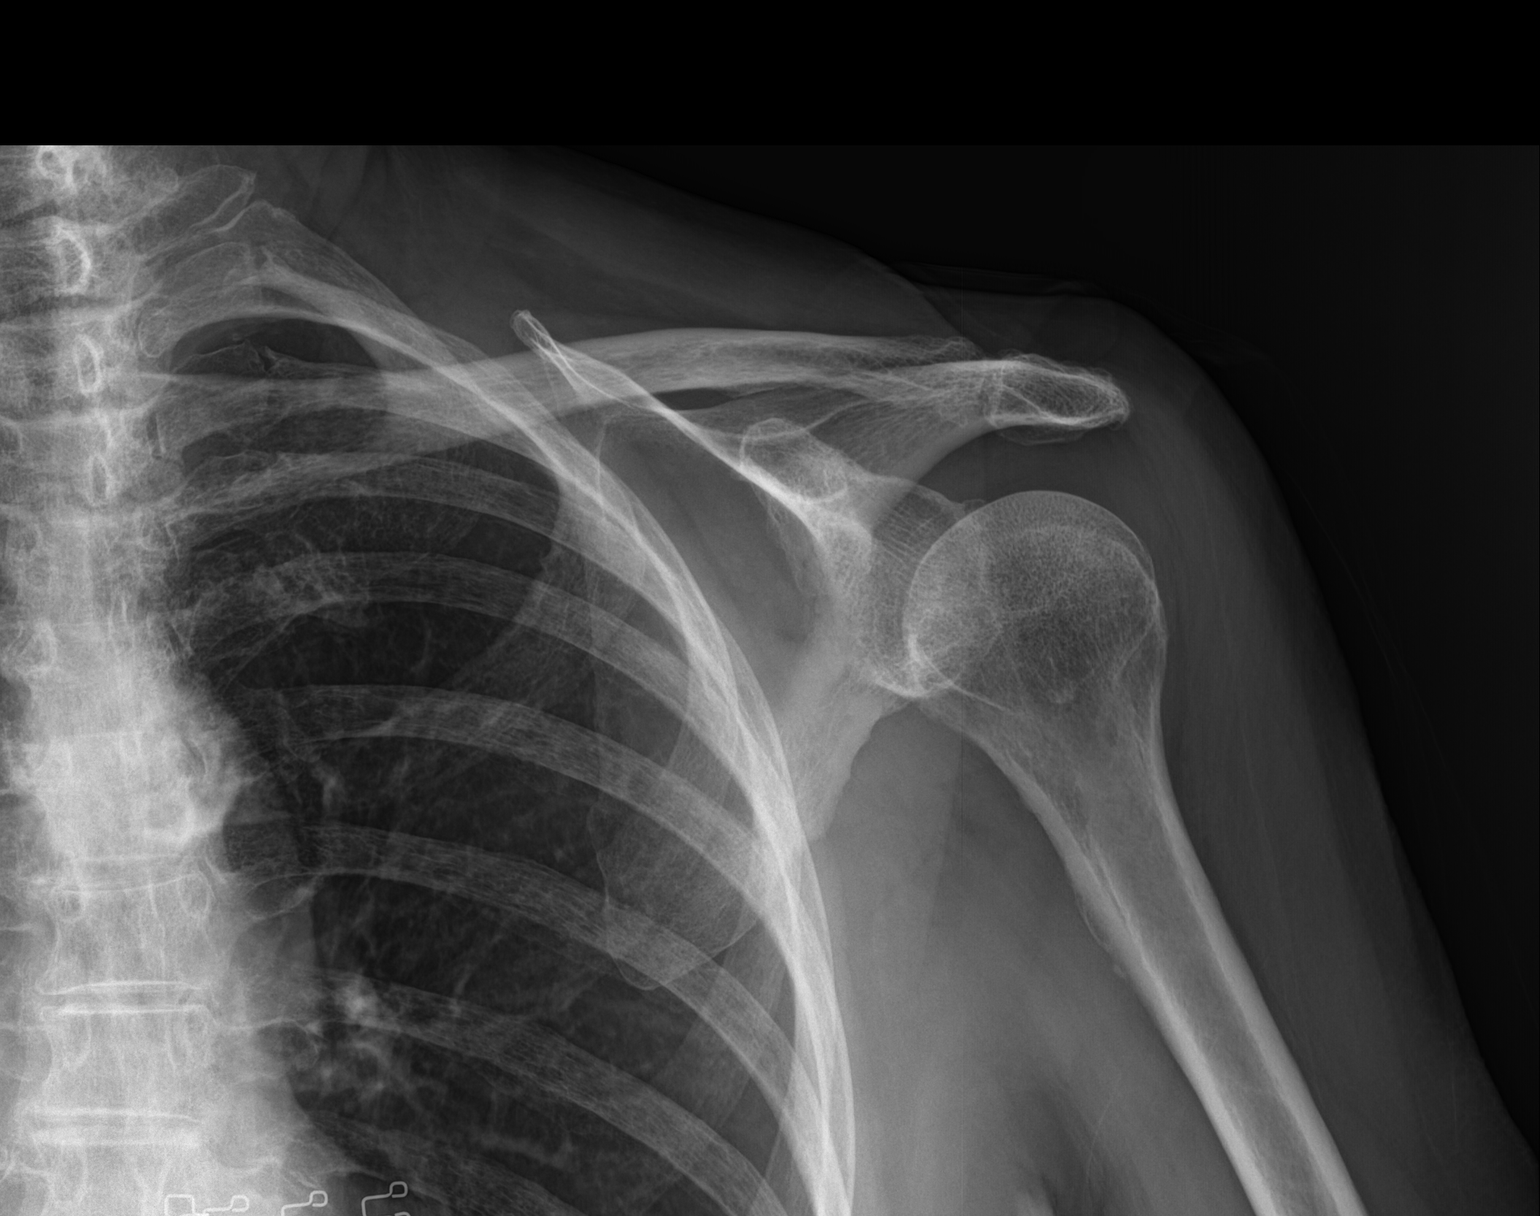

[3 of 3 positions shown; findings below may reference images not displayed]

FINDINGS: Normal alignment. No fracture or dislocation. At least mild
glenohumeral degenerative arthritis with osteophyte formation noted.
Acromioclavicular joint space preserved. The visualized left
hemithorax is unremarkable.
IMPRESSION: At least mild left glenohumeral degenerative arthritis.

## 2022-02-14 ENCOUNTER — Encounter: Payer: Self-pay | Admitting: *Deleted

## 2022-02-14 ENCOUNTER — Ambulatory Visit (INDEPENDENT_AMBULATORY_CARE_PROVIDER_SITE_OTHER): Payer: Medicare Other | Admitting: *Deleted

## 2022-02-14 DIAGNOSIS — Z9181 History of falling: Secondary | ICD-10-CM

## 2022-02-14 DIAGNOSIS — M159 Polyosteoarthritis, unspecified: Secondary | ICD-10-CM

## 2022-02-14 DIAGNOSIS — I10 Essential (primary) hypertension: Secondary | ICD-10-CM

## 2022-02-14 NOTE — Chronic Care Management (AMB) (Signed)
Chronic Care Management   CCM RN Visit Note  02/14/2022 Name: Megan Chapman MRN: [id number] DOB: [birth year]  Subjective: Megan Chapman is a 88 y.o. year old female who is a primary care patient of Megan Chapman, Megan Gash, MD. The care management team was consulted for assistance with disease management and care coordination needs.    Engaged with patient by telephone for follow up visit in response to provider referral for case management and/or care coordination services.   Consent to Services:  The patient was given information about Chronic Care Management services, agreed to services, and gave verbal consent prior to initiation of services.  Please see initial visit note for detailed documentation.   Patient agreed to services and verbal consent obtained.   Assessment: Review of patient past medical history, allergies, medications, health status, including review of consultants reports, laboratory and other test data, was performed as part of comprehensive evaluation and provision of chronic care management services.   SDOH (Social Determinants of Health) assessments and interventions performed:    CCM Care Plan  Allergies  Allergen Reactions   Amoxicillin    Biaxin [Clarithromycin]    Ciprofloxacin Nausea Only   Codeine    Nitrofurantoin Nausea And Vomiting and Nausea Only   Sulfa Antibiotics     Outpatient Encounter Medications as of 02/14/2022  Medication Sig   ALPRAZolam (XANAX) 0.5 MG tablet Take 1 tablet (0.5 mg total) by mouth at bedtime as needed. for sleep   atorvastatin (LIPITOR) 40 MG tablet TAKE 1 TABLET DAILY   Cholecalciferol (VITAMIN D3) 1000 UNITS CAPS Take 2 capsules (2,000 Units total) by mouth daily.   DULoxetine (CYMBALTA) 30 MG capsule TAKE 1 CAPSULE DAILY FOR ARTHRITIS   levothyroxine (SYNTHROID) 25 MCG tablet TAKE 1 TABLET DAILY BEFORE BREAKFAST   Omega-3 Fatty Acids (FISH OIL) 1200 MG CAPS Take 2 capsules by mouth daily.   pantoprazole (PROTONIX) 40 MG tablet  Take 40 mg by mouth 2 (two) times daily. 30 minutes before lunch and 30 minutes before dinner   No facility-administered encounter medications on file as of 02/14/2022.    Patient Active Problem List   Diagnosis Date Noted   COVID-19 virus RNA test result positive at limit of detection 08/29/2021   Palpitations 07/15/2019   Essential hypertension 07/15/2019   Osteoarthritis 04/17/2017   Hypothyroidism 10/11/2015   Vitamin D deficiency 10/11/2015   Hyperlipidemia 06/08/2013    Conditions to be addressed/monitored:HTN and Osteoarthritis  Care Plan : [facility] Care Plan  Updates made by Ilean China, RN since 02/14/2022 12:00 AM     Problem: Chronic Disease Management Needs   Priority: Medium     Long-Range Goal: Work with RN Care Manager Regarding Care Coordination and Care Management Associated with Hypertension and Osteoarthritis   This Visit's Progress: On track  Recent Progress: On track  Priority: Medium  Note:   Current Barriers:  Chronic Disease Management support and education needs related to HTN and osteoarthritis  RNCM Clinical Goal(s):  Patient will take all medications exactly as prescribed and will call provider for medication related questions continue to work with RN Care Manager to address care management and care coordination needs related to HTN and Osteoarthritis  through collaboration with RN Care manager, provider, and care team.   Interventions: 1:1 collaboration with primary care provider regarding development and update of comprehensive plan of care as evidenced by provider attestation and co-signature Inter-disciplinary care team collaboration (see longitudinal plan of care) Evaluation of current treatment plan related to  self  management and patient's adherence to plan as established by provider Provided with RNCM contact information and encouraged to reach out as needed   Falls:  (Status: Condition stable. Not addressed this visit.) Provided written  and verbal education re: potential causes of falls and Fall prevention strategies Reviewed medications and discussed potential side effects of medications such as dizziness and frequent urination Advised patient of importance of notifying provider of falls Assessed for signs and symptoms of orthostatic hypotension Assessed for falls since last encounter Assessed patients knowledge of fall risk prevention secondary to previously provided education Provided patient information for fall alert systems Assessed social determinant of health barriers Assessed for use of assistive devices. Has a cane and walker. Does not use cane but uses walker at times Encouraged to keep phone with her at all times Discussed family/social support   Hypertension: (Status: Goal on track: YES.) Last practice recorded BP readings:  BP Readings from Last 3 Encounters:  12/06/21 (!) 160/68  09/26/21 134/84  06/06/21 (!) 156/74  Evaluation of current treatment plan related to hypertension self management and patient's adherence to plan as established by provider;   Reviewed medications with patient and discussed importance of compliance;  Counseled on the importance of exercise goals with target of 150 minutes per week Discussed plans with patient for ongoing care management follow up and provided patient with direct contact information for care management team; Advised patient, providing education and rationale, to monitor blood pressure daily and record, calling PCP for findings outside established parameters;  Reviewed scheduled/upcoming provider appointments including:  Provided education on prescribed diet Low sodium/DASH diet;  Discussed complications of poorly controlled blood pressure such as heart disease, stroke, circulatory complications, vision complications, kidney impairment, sexual dysfunction;    Pain:  (Status: Goal on track: YES.) Pain assessment performed Medications reviewed. Tolerating Cymbalta  but questions if it is causing a rash. She developed brown spots on her arms and legs and now on abdomen. They are pruritic and some are dry and flaky. Expressed that it may be fungal. She plans on scheduling a dermatology appointment. Encouraged this, especially before considering to d/c cymbalta since it has to be titrated down slowly and can be difficult to d/c.  Reviewed provider established plan for pain management; Discussed importance of adherence to all scheduled medical appointments; Counseled on the importance of reporting any/all new or changed pain symptoms or management strategies to pain management provider; Advised patient to report to care team affect of pain on daily activities; Reviewed with patient prescribed pharmacological and nonpharmacological pain relief strategies; Screening for signs and symptoms of depression related to chronic disease state;  Discussed current pain management and causative or alleviating factors Discussed potential effectiveness of joint injections and frequency Reviewed upcoming appointments  Patient Goals/Self-Care Activities: Patient will self administer medications as prescribed as evidenced by self report/primary caregiver report  Patient will attend all scheduled provider appointments as evidenced by clinician review of documented attendance to scheduled appointments and patient/caregiver report Patient will continue to perform ADL's independently as evidenced by patient/caregiver report Patient will continue to perform IADL's independently as evidenced by patient/caregiver report Patient will call provider office for new concerns or questions as evidenced by review of documented incoming telephone call notes and patient report Patient will reach out to [place] as needed [phone] Patient will move carefully and change positions slowly to decrease fall risk Patient will schedule an appointment with dermatologist to assess rash Patient  will talk with PCP before stopping  any medications   Plan:Telephone follow up appointment with care management team member scheduled for:  03/14/22 with RNCM The patient has been provided with contact information for the care management team and has been advised to call with any health related questions or concerns.   Chong Sicilian, BSN, RN-BC Embedded Chronic Care Manager [facility] [place] [facility] Direct Dial: [phone]

## 2022-02-14 NOTE — Patient Instructions (Signed)
Visit Information  Patient Goals/Self-Care Activities: Patient will self administer medications as prescribed as evidenced by self report/primary caregiver report  Patient will attend all scheduled provider appointments as evidenced by clinician review of documented attendance to scheduled appointments and patient/caregiver report Patient will continue to perform ADL's independently as evidenced by patient/caregiver report Patient will continue to perform IADL's independently as evidenced by patient/caregiver report Patient will call provider office for new concerns or questions as evidenced by review of documented incoming telephone call notes and patient report Patient will reach out to [facility] as needed [phone] Patient will move carefully and change positions slowly to decrease fall risk Patient will schedule an appointment with dermatologist to assess rash Patient will talk with PCP before stopping any medications  Patient verbalizes understanding of instructions and care plan provided today and agrees to view in [place]. Active MyChart status confirmed with patient.    Plan:Telephone follow up appointment with care management team member scheduled for:  03/14/22 with RNCM The patient has been provided with contact information for the care management team and has been advised to call with any health related questions or concerns.   Chong Sicilian, BSN, RN-BC Embedded Chronic Care Manager [facility] / [place] [facility] Direct Dial: [phone]

## 2022-02-22 DIAGNOSIS — L821 Other seborrheic keratosis: Secondary | ICD-10-CM | POA: Diagnosis not present

## 2022-02-22 DIAGNOSIS — Z08 Encounter for follow-up examination after completed treatment for malignant neoplasm: Secondary | ICD-10-CM | POA: Diagnosis not present

## 2022-02-22 DIAGNOSIS — Z85828 Personal history of other malignant neoplasm of skin: Secondary | ICD-10-CM | POA: Diagnosis not present

## 2022-02-22 DIAGNOSIS — L820 Inflamed seborrheic keratosis: Secondary | ICD-10-CM | POA: Diagnosis not present

## 2022-02-27 DIAGNOSIS — M159 Polyosteoarthritis, unspecified: Secondary | ICD-10-CM

## 2022-02-27 DIAGNOSIS — I10 Essential (primary) hypertension: Secondary | ICD-10-CM

## 2022-03-14 ENCOUNTER — Other Ambulatory Visit: Payer: Self-pay | Admitting: Family Medicine

## 2022-03-14 ENCOUNTER — Telehealth: Payer: Medicare Other

## 2022-03-15 ENCOUNTER — Ambulatory Visit (INDEPENDENT_AMBULATORY_CARE_PROVIDER_SITE_OTHER): Payer: Medicare Other | Admitting: *Deleted

## 2022-03-15 DIAGNOSIS — Z9181 History of falling: Secondary | ICD-10-CM

## 2022-03-15 DIAGNOSIS — M159 Polyosteoarthritis, unspecified: Secondary | ICD-10-CM

## 2022-03-15 DIAGNOSIS — I10 Essential (primary) hypertension: Secondary | ICD-10-CM

## 2022-03-19 NOTE — Patient Instructions (Signed)
Visit Information ? ? ?Patient Goals/Self-Care Activities: ?Patient will self administer medications as prescribed as evidenced by self report/primary caregiver report  ?Patient will attend all scheduled provider appointments as evidenced by clinician review of documented attendance to scheduled appointments and patient/caregiver report ?Patient will continue to perform ADL's independently as evidenced by patient/caregiver report ?Patient will continue to perform IADL's independently as evidenced by patient/caregiver report ?Patient will call provider office for new concerns or questions as evidenced by review of documented incoming telephone call notes and patient report ?Patient will reach out to RN Care Manager as needed [phone] ?Patient will move carefully and change positions slowly to decrease fall risk ?Patient will talk with PCP before stopping any medications ?Move carefully and change positions slowly to avoid falls ?Check and record blood pressure 3 times per week and call PCP with any readings outside of recommended range ? ?Patient verbalizes understanding of instructions and care plan provided today and agrees to view in [place]. Active MyChart status confirmed with patient.   ? ?Plan:Telephone follow up appointment with care management team member scheduled for:  04/16/22 with RNCM ?The patient has been provided with contact information for the care management team and has been advised to call with any health related questions or concerns.  ? ?Chong Sicilian, BSN, RN-BC ?Embedded Chronic Care Manager ?[place] [facility] [place] Management ?Direct Dial: [phone] ?  ?

## 2022-03-19 NOTE — Chronic Care Management (AMB) (Signed)
?Chronic Care Management  ? ?CCM RN Visit Note ? ?03/15/2022 ?Name: Megan Chapman MRN: [id number] DOB: 12/07/1931 ? ?Subjective: ?Megan Chapman is a 88 y.o. year old female who is a primary care patient of Stacks, Cletus Gash, MD. The care management team was consulted for assistance with disease management and care coordination needs.   ? ?Engaged with patient by telephone for follow up visit in response to provider referral for case management and/or care coordination services.  ? ?Consent to Services:  ?The patient was given information about Chronic Care Management services, agreed to services, and gave verbal consent prior to initiation of services.  Please see initial visit note for detailed documentation.  ? ?Patient agreed to services and verbal consent obtained.  ? ?Assessment: Review of patient past medical history, allergies, medications, health status, including review of consultants reports, laboratory and other test data, was performed as part of comprehensive evaluation and provision of chronic care management services.  ? ?SDOH (Social Determinants of Health) assessments and interventions performed:   ? ?CCM Care Plan ? ?Allergies  ?Allergen Reactions  ? Amoxicillin   ? Biaxin [Clarithromycin]   ? Ciprofloxacin Nausea Only  ? Codeine   ? Nitrofurantoin Nausea And Vomiting and Nausea Only  ? Sulfa Antibiotics   ? ? ?Outpatient Encounter Medications as of 03/15/2022  ?Medication Sig  ? ALPRAZolam (XANAX) 0.5 MG tablet Take 1 tablet (0.5 mg total) by mouth at bedtime as needed. for sleep  ? atorvastatin (LIPITOR) 40 MG tablet TAKE 1 TABLET DAILY  ? Cholecalciferol (VITAMIN D3) 1000 UNITS CAPS Take 2 capsules (2,000 Units total) by mouth daily.  ? DULoxetine (CYMBALTA) 30 MG capsule TAKE 1 CAPSULE DAILY FOR ARTHRITIS  ? levothyroxine (SYNTHROID) 25 MCG tablet TAKE 1 TABLET DAILY BEFORE BREAKFAST  ? Omega-3 Fatty Acids (FISH OIL) 1200 MG CAPS Take 2 capsules by mouth daily.  ? pantoprazole (PROTONIX) 40 MG tablet  Take 40 mg by mouth 2 (two) times daily. 30 minutes before lunch and 30 minutes before dinner  ? ?No facility-administered encounter medications on file as of 03/15/2022.  ? ? ?Patient Active Problem List  ? Diagnosis Date Noted  ? COVID-19 virus RNA test result positive at limit of detection 08/29/2021  ? Palpitations 07/15/2019  ? Essential hypertension 07/15/2019  ? Osteoarthritis 04/17/2017  ? Hypothyroidism 10/11/2015  ? Vitamin D deficiency 10/11/2015  ? Hyperlipidemia 06/08/2013  ? ? ?Conditions to be addressed/monitored:HTN and Osteoarthritis ? ?Care Plan : RNCM Care Plan  ?  ? ?Problem: Chronic Disease Management Needs   ?Priority: Medium  ?  ? ?Long-Range Goal: Work with Consulting civil engineer Regarding [place] with Hypertension and Osteoarthritis   ?This Visit's Progress: On track  ?Recent Progress: On track  ?Priority: High  ?Note:   ?Current Barriers:  ?Chronic Disease Management support and education needs related to HTN and osteoarthritis ? ?RNCM Clinical Goal(s):  ?Patient will take all medications exactly as prescribed and will call provider for medication related questions ?continue to work with [place] to address care management and care coordination needs related to HTN and Osteoarthritis  through collaboration with [place], provider, and care team.  ? ?Interventions: ?1:1 collaboration with primary care provider regarding development and update of comprehensive plan of care as evidenced by provider attestation and co-signature ?Inter-disciplinary care team collaboration (see longitudinal plan of care) ?Evaluation of current treatment plan related to  self management and patient's adherence to plan as established by provider ?Provided with  RNCM contact information and encouraged to reach out as needed ? ? ?Falls:  (Status: Goal on Track (progressing): YES.) ?Provided written and verbal education re: potential causes of falls and Fall prevention  strategies ?Reviewed medications and discussed potential side effects of medications such as dizziness and frequent urination ?Advised patient of importance of notifying provider of falls ?Assessed for signs and symptoms of orthostatic hypotension ?Assessed for falls since last encounter ?Assessed patients knowledge of fall risk prevention secondary to previously provided education ?Assessed social determinant of health barriers ?Assessed for use of assistive devices. Has a cane and walker. Does not use cane but uses walker at times ?Encouraged to keep phone with her at all times ?Discussed family/social support ? ? ?Hypertension: (Status: Goal on track: YES.) ?Last practice recorded BP readings:  ?BP Readings from Last 3 Encounters:  ?12/06/21 (!) 160/68  ?09/26/21 134/84  ?06/06/21 (!) 156/74  ? ?Evaluation of current treatment plan related to hypertension self management and patient's adherence to plan as established by provider;   ?Reviewed medications with patient and discussed importance of compliance;  ?Counseled on the importance of exercise goals with target of 150 minutes per week ?Discussed plans with patient for ongoing care management follow up and provided patient with direct contact information for care management team; ?Advised patient, providing education and rationale, to monitor blood pressure daily and record, calling PCP for findings outside established parameters;  ?Reviewed scheduled/upcoming provider appointments including:  ?Provided education on prescribed diet Low sodium/DASH diet;  ?Discussed complications of poorly controlled blood pressure such as heart disease, stroke, circulatory complications, vision complications, kidney impairment, sexual dysfunction;  ? ? ?Pain:  (Status: Goal on track: YES.) ?Pain assessment performed ?Medications reviewed.  ?Reviewed provider established plan for pain management; ?Discussed importance of adherence to all scheduled medical  appointments; ?Counseled on the importance of reporting any/all new or changed pain symptoms or management strategies to pain management provider; ?Advised patient to report to care team affect of pain on daily activities; ?Reviewed with patient prescribed pharmacological and nonpharmacological pain relief strategies; ?Screening for signs and symptoms of depression related to chronic disease state;  ?Discussed current pain management and causative or alleviating factors ?Discussed potential effectiveness of joint injections and frequency ?Reviewed upcoming appointments ? ? ?Patient Goals/Self-Care Activities: ?Patient will self administer medications as prescribed as evidenced by self report/primary caregiver report  ?Patient will attend all scheduled provider appointments as evidenced by clinician review of documented attendance to scheduled appointments and patient/caregiver report ?Patient will continue to perform ADL's independently as evidenced by patient/caregiver report ?Patient will continue to perform IADL's independently as evidenced by patient/caregiver report ?Patient will call provider office for new concerns or questions as evidenced by review of documented incoming telephone call notes and patient report ?Patient will reach out to RN Care Manager as needed [phone] ?Patient will move carefully and change positions slowly to decrease fall risk ?Patient will talk with PCP before stopping any medications ?Move carefully and change positions slowly to avoid falls ?Check and record blood pressure 3 times per week and call PCP with any readings outside of recommended range ? ?Plan:Telephone follow up appointment with care management team member scheduled for:  04/16/22 with RNCM ?The patient has been provided with contact information for the care management team and has been advised to call with any health related questions or concerns.  ? ?Chong Sicilian, BSN, RN-BC ?Embedded Chronic Care Manager ?[place] / [place] Management ?Direct Dial: [phone] ? ?  ? ? ? ? ? ? ? ? ? ? ?

## 2022-03-22 ENCOUNTER — Encounter: Payer: Self-pay | Admitting: Family Medicine

## 2022-03-25 NOTE — Telephone Encounter (Signed)
Will need an office visit. ?

## 2022-03-30 DIAGNOSIS — I10 Essential (primary) hypertension: Secondary | ICD-10-CM

## 2022-03-30 DIAGNOSIS — M159 Polyosteoarthritis, unspecified: Secondary | ICD-10-CM

## 2022-04-02 DIAGNOSIS — H6123 Impacted cerumen, bilateral: Secondary | ICD-10-CM | POA: Diagnosis not present

## 2022-04-02 DIAGNOSIS — H838X3 Other specified diseases of inner ear, bilateral: Secondary | ICD-10-CM | POA: Diagnosis not present

## 2022-04-02 DIAGNOSIS — H903 Sensorineural hearing loss, bilateral: Secondary | ICD-10-CM | POA: Diagnosis not present

## 2022-04-09 DIAGNOSIS — Z9889 Other specified postprocedural states: Secondary | ICD-10-CM | POA: Diagnosis not present

## 2022-04-09 DIAGNOSIS — M950 Acquired deformity of nose: Secondary | ICD-10-CM | POA: Diagnosis not present

## 2022-04-16 ENCOUNTER — Telehealth: Payer: Medicare Other

## 2022-05-16 ENCOUNTER — Encounter: Payer: Self-pay | Admitting: Nurse Practitioner

## 2022-05-16 ENCOUNTER — Ambulatory Visit (INDEPENDENT_AMBULATORY_CARE_PROVIDER_SITE_OTHER): Payer: Medicare Other | Admitting: Nurse Practitioner

## 2022-05-16 VITALS — BP 148/76 | HR 72 | Temp 98.7°F | Ht 62.0 in | Wt 146.0 lb

## 2022-05-16 DIAGNOSIS — M25532 Pain in left wrist: Secondary | ICD-10-CM | POA: Diagnosis not present

## 2022-05-16 MED ORDER — ACETAMINOPHEN 500 MG PO TABS: 500.0000 mg | ORAL_TABLET | Freq: Four times a day (QID) | ORAL | 0 refills | Status: AC | PRN

## 2022-05-16 NOTE — Progress Notes (Signed)
? ?  Acute Office Visit ? ?Subjective:  ? ?  ?Patient ID: Megan Chapman, female    DOB: July 25, 1931, 88 y.o.   MRN: [id number] ? ?Chief Complaint  ?Patient presents with  ? Wrist Pain  ?  Left Wrist - swelling 10 days after it happened   ? ? ?Wrist Pain  ?This is a new problem. The problem has been unchanged. The quality of the pain is described as aching. The pain is at a severity of 5/10. The pain is moderate. Associated symptoms include joint swelling. Pertinent negatives include no fever or itching. The symptoms are aggravated by activity. She has tried acetaminophen for the symptoms. The treatment provided significant relief.  ? ? ?Review of Systems  ?Constitutional: Negative.  Negative for fever.  ?Respiratory: Negative.    ?Cardiovascular: Negative.   ?Musculoskeletal:  Positive for joint pain.  ?Skin: Negative.  Negative for itching and rash.  ?All other systems reviewed and are negative. ? ? ?   ?Objective:  ?  ?BP (!) 148/76   Pulse 72   Temp 98.7 ?F (37.1 ?C)   Ht '5\' 2"'$  (1.575 m)   Wt 146 lb (66.2 kg)   SpO2 95%   BMI 26.70 kg/m?  ? ? ?Physical Exam ?Vitals and nursing note reviewed.  ?Constitutional:   ?   Appearance: Normal appearance.  ?HENT:  ?   Head: Normocephalic.  ?   Right Ear: External ear normal.  ?   Left Ear: External ear normal.  ?   Mouth/Throat:  ?   Pharynx: Oropharynx is clear.  ?Eyes:  ?   Conjunctiva/sclera: Conjunctivae normal.  ?Cardiovascular:  ?   Rate and Rhythm: Normal rate and regular rhythm.  ?   Pulses: Normal pulses.  ?   Heart sounds: Normal heart sounds.  ?Pulmonary:  ?   Effort: Pulmonary effort is normal.  ?   Breath sounds: Normal breath sounds.  ?Abdominal:  ?   General: Bowel sounds are normal.  ?Musculoskeletal:     ?   General: Normal range of motion.  ?     Arms: ? ?   Comments: Swelling and inflammation   ?Skin: ?   General: Skin is warm.  ?   Findings: No rash.  ?Neurological:  ?   General: No focal deficit present.  ?   Mental Status: She is alert and oriented  to person, place, and time.  ?Psychiatric:     ?   Behavior: Behavior normal.  ? ? ?No results found for any visits on 05/16/22. ? ? ?   ?Assessment & Plan:  ?Take medication as prescribed ?-Ice and elevate  ?-follow up with worsening unresolved symptoms ? ?Problem List Items Addressed This Visit   ?None ?Visit Diagnoses   ? ? Left wrist pain    -  Primary  ? Relevant Medications  ? acetaminophen (TYLENOL) 500 MG tablet  ? ?  ? ? ?Meds ordered this encounter  ?Medications  ? acetaminophen (TYLENOL) 500 MG tablet  ?  Sig: Take 1 tablet (500 mg total) by mouth every 6 (six) hours as needed.  ?  Dispense:  30 tablet  ?  Refill:  0  ?  Order Specific Question:   Supervising Provider  ?  AnswerClaretta Fraise [[id number]]  ? ? ?Return if symptoms worsen or fail to improve. ? ?Ivy Lynn, NP ? ? ?

## 2022-05-16 NOTE — Patient Instructions (Signed)
Hand Pain Many things can cause hand pain. Some common causes are: An injury. Repeating the same movement with your hand over and over (overuse). Osteoporosis. Arthritis. Lumps in the tendons or joints of the hand and wrist (ganglion cysts). Nerve compression syndromes (carpal tunnel syndrome). Inflammation of the tendons (tendinitis). Infection. Follow these instructions at home: Pay attention to any changes in your symptoms. Take these actions to help with your discomfort: Managing pain, stiffness, and swelling  Take over-the-counter and prescription medicines only as told by your health care provider. Wear a hand splint or support as told by your health care provider. If directed, put ice on the affected area: Put ice in a plastic bag. Place a towel between your skin and the bag. Leave the ice on for 20 minutes, 2-3 times a day. Activity Take breaks from repetitive activity often. Avoid activities that make your pain worse. Minimize stress on your hands and wrists as much as possible. Do stretches or exercises as told by your health care provider. Do not do activities that make your pain worse. Contact a health care provider if: Your pain does not get better after a few days of self-care. Your pain gets worse. Your pain affects your ability to do your daily activities. Get help right away if: Your hand becomes warm, red, or swollen. Your hand is numb or tingling. Your hand is extremely swollen or deformed. Your hand or fingers turn white or blue. You cannot move your hand, wrist, or fingers. Summary Many things can cause hand pain. Contact your health care provider if your pain does not get better after a few days of self care. Minimize stress on your hands and wrists as much as possible. Do not do activities that make your pain worse. This information is not intended to replace advice given to you by your health care provider. Make sure you discuss any questions you have  with your health care provider. Document Revised: 04/06/2021 Document Reviewed: 04/06/2021 Elsevier Patient Education  2023 Elsevier Inc.  

## 2022-05-31 ENCOUNTER — Other Ambulatory Visit: Payer: Self-pay | Admitting: Family Medicine

## 2022-06-06 ENCOUNTER — Encounter: Payer: Self-pay | Admitting: Family Medicine

## 2022-06-06 ENCOUNTER — Ambulatory Visit (INDEPENDENT_AMBULATORY_CARE_PROVIDER_SITE_OTHER): Payer: Medicare Other | Admitting: Family Medicine

## 2022-06-06 VITALS — BP 165/74 | HR 79 | Temp 97.6°F | Ht 62.0 in | Wt 146.8 lb

## 2022-06-06 DIAGNOSIS — E782 Mixed hyperlipidemia: Secondary | ICD-10-CM | POA: Diagnosis not present

## 2022-06-06 DIAGNOSIS — M25532 Pain in left wrist: Secondary | ICD-10-CM

## 2022-06-06 DIAGNOSIS — E038 Other specified hypothyroidism: Secondary | ICD-10-CM

## 2022-06-06 DIAGNOSIS — M159 Polyosteoarthritis, unspecified: Secondary | ICD-10-CM

## 2022-06-06 DIAGNOSIS — F419 Anxiety disorder, unspecified: Secondary | ICD-10-CM | POA: Diagnosis not present

## 2022-06-06 DIAGNOSIS — I10 Essential (primary) hypertension: Secondary | ICD-10-CM

## 2022-06-06 MED ORDER — LEVOTHYROXINE SODIUM 25 MCG PO TABS: 25.0000 ug | ORAL_TABLET | Freq: Every day | ORAL | 3 refills | Status: DC

## 2022-06-06 MED ORDER — DULOXETINE HCL 30 MG PO CPEP: ORAL_CAPSULE | ORAL | 3 refills | Status: DC

## 2022-06-06 MED ORDER — QUETIAPINE FUMARATE 25 MG PO TABS: 25.0000 mg | ORAL_TABLET | Freq: Every day | ORAL | 1 refills | Status: DC

## 2022-06-06 MED ORDER — LEVOTHYROXINE SODIUM 25 MCG PO TABS
25.0000 ug | ORAL_TABLET | Freq: Every day | ORAL | 3 refills | Status: DC
Start: 2022-06-06 — End: 2022-06-06

## 2022-06-06 NOTE — Progress Notes (Signed)
Subjective:  Patient ID: Megan Chapman, female    DOB: October 22, 1931  Age: 88 y.o. MRN: [id number]  CC: Medical Management of Chronic Issues   HPI Megan Chapman presents for  follow-up on  thyroid. The patient has a history of hypothyroidism for many years. It has been stable recently. Pt. denies any change in  voice, loss of hair, heat or cold intolerance. Energy level has been adequate to good. Patient denies constipation and diarrhea. No myxedema. Medication is as noted below. Verified that pt is taking it daily on an empty stomach. Well tolerated.   presents for  follow-up of hypertension. Patient has no history of headache chest pain or shortness of breath or recent cough. Patient also denies symptoms of TIA such as focal numbness or weakness. Patient denies side effects from medication. States taking it regularly.   in for follow-up of elevated cholesterol. Doing well without complaints on current medication. Denies side effects of statin including myalgia and arthralgia and nausea. Currently no chest pain, shortness of breath or other cardiovascular related symptoms noted.       06/06/2022    9:59 AM 12/06/2021    8:32 AM 09/26/2021    9:57 AM  Depression screen PHQ 2/9  Decreased Interest 0 0 0  Down, Depressed, Hopeless 0 0 0  PHQ - 2 Score 0 0 0  Altered sleeping   0  Tired, decreased energy   0  Change in appetite   0  Feeling bad or failure about yourself    0  Trouble concentrating   0  Moving slowly or fidgety/restless   0  Suicidal thoughts   0  PHQ-9 Score   0    History Megan Chapman has a past medical history of Allergy, Anxiety, Arthritis, Asthma, Basal cell carcinoma, Cataract, GERD (gastroesophageal reflux disease), HTN (hypertension), Hyperlipidemia, Migraine, Palpitations, and Thyroid disease.   She has a past surgical history that includes Partial knee arthroplasty (Left); Abdominal hysterectomy; Meniscus repair (Right); Excision basal cell carcinoma (2017); Cataract  extraction (Bilateral); Appendectomy; Tonsillectomy; and Colonoscopy.   Her family history includes COPD in her son; Cancer in her brother; Cancer - Colon in her mother; Colon cancer in her mother; Down syndrome in her daughter; Migraines in her brother and son; Prostate cancer in her father; Stroke in her brother.She reports that she has never smoked. She has never used smokeless tobacco. She reports that she does not drink alcohol and does not use drugs.    ROS Review of Systems  Constitutional: Negative.   HENT: Negative.    Eyes:  Negative for visual disturbance.  Respiratory:  Negative for shortness of breath.   Cardiovascular:  Negative for chest pain.  Gastrointestinal:  Negative for abdominal pain.  Musculoskeletal:  Positive for arthralgias (left wrist pain, NKI).    Objective:  BP (!) 165/74   Pulse 79   Temp 97.6 F (36.4 C)   Ht _0  (1.575 m)   Wt 146 lb 12.8 oz (66.6 kg)   SpO2 96%   BMI 26.85 kg/m   BP Readings from Last 3 Encounters:  06/06/22 (!) 165/74  05/16/22 (!) 148/76  12/06/21 (!) 160/68    Wt Readings from Last 3 Encounters:  06/06/22 146 lb 12.8 oz (66.6 kg)  05/16/22 146 lb (66.2 kg)  12/06/21 142 lb (64.4 kg)     Physical Exam Constitutional:      General: She is not in acute distress.    Appearance: She is well-developed.  Cardiovascular:  Rate and Rhythm: Normal rate and regular rhythm.  Pulmonary:     Breath sounds: Normal breath sounds.  Musculoskeletal:        General: Normal range of motion.  Skin:    General: Skin is warm and dry.  Neurological:     Mental Status: She is alert and oriented to person, place, and time.       Assessment & Plan:   [name] was seen today for medical management of chronic issues.  Diagnoses and all orders for this visit:  Essential hypertension -     CBC with Differential/Platelet -     CMP14+EGFR  Mixed hyperlipidemia -     Lipid panel  Other specified hypothyroidism -     TSH +  free T4  Primary osteoarthritis involving multiple joints -     Discontinue: DULoxetine (CYMBALTA) 30 MG capsule; TAKE 1 CAPSULE DAILY FOR ARTHRITIS -     DULoxetine (CYMBALTA) 30 MG capsule; TAKE 1 CAPSULE DAILY FOR ARTHRITIS  Anxiety -     Discontinue: DULoxetine (CYMBALTA) 30 MG capsule; TAKE 1 CAPSULE DAILY FOR ARTHRITIS -     DULoxetine (CYMBALTA) 30 MG capsule; TAKE 1 CAPSULE DAILY FOR ARTHRITIS  Left wrist pain -     DG Wrist Complete Left; Future  Other orders -     Discontinue: levothyroxine (SYNTHROID) 25 MCG tablet; Take 1 tablet (25 mcg total) by mouth daily before breakfast. -     Discontinue: QUEtiapine (SEROQUEL) 25 MG tablet; Take 1 tablet (25 mg total) by mouth at bedtime. -     QUEtiapine (SEROQUEL) 25 MG tablet; Take 1 tablet (25 mg total) by mouth at bedtime. -     levothyroxine (SYNTHROID) 25 MCG tablet; Take 1 tablet (25 mcg total) by mouth daily before breakfast.       I have discontinued [name]'s ALPRAZolam. I am also having her maintain her Vitamin D3, Fish Oil, pantoprazole, acetaminophen, atorvastatin, QUEtiapine, DULoxetine, and levothyroxine.  Allergies as of 06/06/2022       Reactions   Amoxicillin    Biaxin [clarithromycin]    Ciprofloxacin Nausea Only   Codeine    Nitrofurantoin Nausea And Vomiting, Nausea Only   Sulfa Antibiotics         Medication List        Accurate as of June 06, 2022 11:59 PM. If you have any questions, ask your nurse or doctor.          STOP taking these medications    ALPRAZolam 0.5 MG tablet Commonly known as: XANAX Stopped by: [name], MD       TAKE these medications    acetaminophen 500 MG tablet Commonly known as: TYLENOL Take 1 tablet (500 mg total) by mouth every 6 (six) hours as needed.   atorvastatin 40 MG tablet Commonly known as: LIPITOR TAKE 1 TABLET DAILY   DULoxetine 30 MG capsule Commonly known as: CYMBALTA TAKE 1 CAPSULE DAILY FOR ARTHRITIS   Fish Oil 1200 MG  Caps Take 2 capsules by mouth daily.   levothyroxine 25 MCG tablet Commonly known as: SYNTHROID Take 1 tablet (25 mcg total) by mouth daily before breakfast.   pantoprazole 40 MG tablet Commonly known as: PROTONIX Take 40 mg by mouth 2 (two) times daily. 30 minutes before lunch and 30 minutes before dinner   QUEtiapine 25 MG tablet Commonly known as: SEROQUEL Take 1 tablet (25 mg total) by mouth at bedtime. Started by: [name], MD   Vitamin D3 25  MCG (1000 UT) Caps Take 2 capsules (2,000 Units total) by mouth daily.       DASH diet printed, reviewed  Follow-up: Return in about 3 months (around 09/06/2022).  [name], M.D.

## 2022-06-06 NOTE — Patient Instructions (Signed)

## 2022-06-07 ENCOUNTER — Ambulatory Visit (INDEPENDENT_AMBULATORY_CARE_PROVIDER_SITE_OTHER): Payer: Medicare Other

## 2022-06-07 DIAGNOSIS — M25532 Pain in left wrist: Secondary | ICD-10-CM

## 2022-06-07 LAB — CBC WITH DIFFERENTIAL/PLATELET
Basophils Absolute: 0 10*3/uL (ref 0.0–0.2)
Basos: 1 %
EOS (ABSOLUTE): 0.1 10*3/uL (ref 0.0–0.4)
Eos: 2 %
Hematocrit: 38.3 % (ref 34.0–46.6)
Hemoglobin: 12.6 g/dL (ref 11.1–15.9)
Immature Grans (Abs): 0 10*3/uL (ref 0.0–0.1)
Immature Granulocytes: 1 %
Lymphocytes Absolute: 1.3 10*3/uL (ref 0.7–3.1)
Lymphs: 33 %
MCH: 29.2 pg (ref 26.6–33.0)
MCHC: 32.9 g/dL (ref 31.5–35.7)
MCV: 89 fL (ref 79–97)
Monocytes Absolute: 0.6 10*3/uL (ref 0.1–0.9)
Monocytes: 16 %
Neutrophils Absolute: 1.8 10*3/uL (ref 1.4–7.0)
Neutrophils: 47 %
Platelets: 214 10*3/uL (ref 150–450)
RBC: 4.32 x10E6/uL (ref 3.77–5.28)
RDW: 13.1 % (ref 11.7–15.4)
WBC: 3.9 10*3/uL (ref 3.4–10.8)

## 2022-06-07 LAB — CMP14+EGFR
ALT: 14 IU/L (ref 0–32)
AST: 15 IU/L (ref 0–40)
Albumin/Globulin Ratio: 1.8 (ref 1.2–2.2)
Albumin: 4.6 g/dL (ref 3.6–4.6)
Alkaline Phosphatase: 67 IU/L (ref 44–121)
BUN/Creatinine Ratio: 16 (ref 12–28)
BUN: 15 mg/dL (ref 8–27)
Bilirubin Total: 0.6 mg/dL (ref 0.0–1.2)
CO2: 24 mmol/L (ref 20–29)
Calcium: 9.7 mg/dL (ref 8.7–10.3)
Chloride: 101 mmol/L (ref 96–106)
Creatinine, Ser: 0.92 mg/dL (ref 0.57–1.00)
Globulin, Total: 2.6 g/dL (ref 1.5–4.5)
Glucose: 91 mg/dL (ref 70–99)
Potassium: 4.8 mmol/L (ref 3.5–5.2)
Sodium: 139 mmol/L (ref 134–144)
Total Protein: 7.2 g/dL (ref 6.0–8.5)
eGFR: 60 mL/min/{1.73_m2} (ref >59)

## 2022-06-07 LAB — TSH+FREE T4
Free T4: 1.04 ng/dL (ref 0.82–1.77)
TSH: 3.34 u[IU]/mL (ref 0.450–4.500)

## 2022-06-07 LAB — LIPID PANEL
Chol/HDL Ratio: 3.6 ratio (ref 0.0–4.4)
Cholesterol, Total: 184 mg/dL (ref 100–199)
HDL: 51 mg/dL (ref >39)
LDL Chol Calc (NIH): 100 mg/dL — ABNORMAL HIGH (ref 0–99)
Triglycerides: 194 mg/dL — ABNORMAL HIGH (ref 0–149)
VLDL Cholesterol Cal: 33 mg/dL (ref 5–40)

## 2022-06-09 ENCOUNTER — Encounter: Payer: Self-pay | Admitting: Family Medicine

## 2022-06-09 DIAGNOSIS — M4306 Spondylolysis, lumbar region: Secondary | ICD-10-CM

## 2022-06-09 DIAGNOSIS — M4722 Other spondylosis with radiculopathy, cervical region: Secondary | ICD-10-CM

## 2022-06-11 MED ORDER — DICLOFENAC SODIUM 75 MG PO TBEC: 75.0000 mg | DELAYED_RELEASE_TABLET | Freq: Two times a day (BID) | ORAL | 2 refills | Status: DC

## 2022-06-11 MED ORDER — PANTOPRAZOLE SODIUM 40 MG PO TBEC: 40.0000 mg | DELAYED_RELEASE_TABLET | Freq: Two times a day (BID) | ORAL | 1 refills | Status: DC

## 2022-06-11 NOTE — Telephone Encounter (Signed)
I think she would do okay with the diclofenac as long as she took Protonix along with it to protect her stomach.  It is worth trying way.  At the first sign of worsening heartburn she should discontinue it.  I sent refills of both medications to [facility] for you.

## 2022-06-12 ENCOUNTER — Other Ambulatory Visit: Payer: Self-pay | Admitting: *Deleted

## 2022-06-12 DIAGNOSIS — M4722 Other spondylosis with radiculopathy, cervical region: Secondary | ICD-10-CM

## 2022-06-12 DIAGNOSIS — M4306 Spondylolysis, lumbar region: Secondary | ICD-10-CM

## 2022-06-12 MED ORDER — PANTOPRAZOLE SODIUM 40 MG PO TBEC: 40.0000 mg | DELAYED_RELEASE_TABLET | Freq: Two times a day (BID) | ORAL | 1 refills | Status: DC

## 2022-06-12 MED ORDER — DICLOFENAC SODIUM 75 MG PO TBEC: 75.0000 mg | DELAYED_RELEASE_TABLET | Freq: Two times a day (BID) | ORAL | 2 refills | Status: DC

## 2022-06-13 DIAGNOSIS — J3489 Other specified disorders of nose and nasal sinuses: Secondary | ICD-10-CM | POA: Diagnosis not present

## 2022-06-13 DIAGNOSIS — Z9889 Other specified postprocedural states: Secondary | ICD-10-CM | POA: Diagnosis not present

## 2022-06-13 DIAGNOSIS — M950 Acquired deformity of nose: Secondary | ICD-10-CM | POA: Diagnosis not present

## 2022-06-13 DIAGNOSIS — Z85828 Personal history of other malignant neoplasm of skin: Secondary | ICD-10-CM | POA: Diagnosis not present

## 2022-06-13 DIAGNOSIS — Z483 Aftercare following surgery for neoplasm: Secondary | ICD-10-CM | POA: Diagnosis not present

## 2022-07-11 ENCOUNTER — Ambulatory Visit: Payer: Self-pay | Admitting: *Deleted

## 2022-07-11 NOTE — Chronic Care Management (AMB) (Signed)
  Chronic Care Management   Note  07/11/2022 Name: Megan Chapman MRN: [id number] DOB: 04-23-1931   Patient is stable from RN Care Management perspective or has not recently engaged with the [place]. I am removing RN Care Manager from Care Team and closing [place]. If patient is currently engaged with another CCM team member I will forward this encounter to inform them of my case closure. Patient may be eligible for re-engagement with RN Care Manager in the future if necessary and can discuss this with their PCP.  Chong Sicilian, BSN, RN-BC Embedded Chronic Care Manager [facility] [place] Management Direct Dial: [phone]

## 2022-07-11 NOTE — Patient Instructions (Signed)
Megan Chapman  I have enjoyed working with you through the Chronic Care Management Program at [facility]. Due to program changes I am removing myself from your care team because you've either met our goals, your conditions are stable and no longer require care management, or we haven't engaged within the past 6 months. If you are currently active with another CCM Team Member, you will remain active with them unless they reach out to you with additional information. If you feel that you need RN Care Management services in the future, please talk with your primary care provider to discuss re-engagement with the RN Care Manager that will be assigned to [facility]. This does not affect your status as a patient at [name].   Thank you for allowing me to participate in your your healthcare journey.  Chong Sicilian, BSN, RN-BC Embedded Chronic Care Manager [facility] [place] Management Direct Dial: [phone]

## 2022-07-27 ENCOUNTER — Telehealth: Payer: Self-pay | Admitting: Family Medicine

## 2022-07-27 MED ORDER — LEVOTHYROXINE SODIUM 25 MCG PO TABS: 25.0000 ug | ORAL_TABLET | Freq: Every day | ORAL | 3 refills | Status: DC

## 2022-07-27 NOTE — Telephone Encounter (Signed)
LMOVM sent Levothyroxine to [facility] & called and cancelled at [facility]

## 2022-07-27 NOTE — Telephone Encounter (Signed)
  Prescription Request  07/27/2022  Is this a "Controlled Substance" medicine? no  Have you seen your PCP in the last 2 weeks? no  If YES, route message to pool  -  If NO, patient needs to be scheduled for appointment.  What is the name of the medication or equipment? levothyroxine (SYNTHROID) 25 MCG tablet  Have you contacted your pharmacy to request a refill? yes   Which pharmacy would you like this sent to? [place], [place]   *[facility]    Patient notified that their request is being sent to the clinical staff for review and that they should receive a response within 2 business days.

## 2022-07-30 DIAGNOSIS — M654 Radial styloid tenosynovitis [de Quervain]: Secondary | ICD-10-CM | POA: Diagnosis not present

## 2022-07-30 DIAGNOSIS — M1812 Unilateral primary osteoarthritis of first carpometacarpal joint, left hand: Secondary | ICD-10-CM | POA: Insufficient documentation

## 2022-08-05 ENCOUNTER — Other Ambulatory Visit: Payer: Self-pay | Admitting: Family Medicine

## 2022-08-05 DIAGNOSIS — F419 Anxiety disorder, unspecified: Secondary | ICD-10-CM

## 2022-08-05 DIAGNOSIS — M150 Primary generalized (osteo)arthritis: Secondary | ICD-10-CM

## 2022-08-05 DIAGNOSIS — M159 Polyosteoarthritis, unspecified: Secondary | ICD-10-CM

## 2022-08-08 ENCOUNTER — Ambulatory Visit (INDEPENDENT_AMBULATORY_CARE_PROVIDER_SITE_OTHER): Payer: Medicare Other

## 2022-08-08 VITALS — Wt 146.0 lb

## 2022-08-08 DIAGNOSIS — Z0000 Encounter for general adult medical examination without abnormal findings: Secondary | ICD-10-CM | POA: Diagnosis not present

## 2022-08-08 NOTE — Patient Instructions (Signed)
Megan Chapman , Thank you for taking time to come for your Medicare Wellness Visit. I appreciate your ongoing commitment to your health goals. Please review the following plan we discussed and let me know if I can assist you in the future.   Screening recommendations/referrals: Colonoscopy: no longer required Mammogram: no longer required Bone Density: no longer required Recommended yearly ophthalmology/optometry visit for glaucoma screening and checkup Recommended yearly dental visit for hygiene and checkup  Vaccinations: Influenza vaccine: Done 12/06/2021 - Repeat annually  Pneumococcal vaccine: Done 11/30/2002 & 04/14/2018 Tdap vaccine: Done 2006 - Repeat in 10 years *past due Shingles vaccine: Due - Shingrix is 2 doses 2-6 months apart and over 90% effective     Covid-19: Done 01/13/2020, 02/10/2020, 12/28/2020  Advanced directives: Please bring a copy of your health care power of attorney and living will to the office to be added to your chart at your convenience.   Conditions/risks identified: Aim for 30 minutes of exercise or brisk walking, 6-8 glasses of water, and 5 servings of fruits and vegetables each day.   Next appointment: Follow up in one year for your annual wellness visit    Preventive Care 86 Years and Older, Female Preventive care refers to lifestyle choices and visits with your health care provider that can promote health and wellness. What does preventive care include? A yearly physical exam. This is also called an annual well check. Dental exams once or twice a year. Routine eye exams. Ask your health care provider how often you should have your eyes checked. Personal lifestyle choices, including: Daily care of your teeth and gums. Regular physical activity. Eating a healthy diet. Avoiding tobacco and drug use. Limiting alcohol use. Practicing safe sex. Taking low-dose aspirin every day. Taking vitamin and mineral supplements as recommended by your health care  provider. What happens during an annual well check? The services and screenings done by your health care provider during your annual well check will depend on your age, overall health, lifestyle risk factors, and family history of disease. Counseling  Your health care provider may ask you questions about your: Alcohol use. Tobacco use. Drug use. Emotional well-being. Home and relationship well-being. Sexual activity. Eating habits. History of falls. Memory and ability to understand (cognition). Work and work Statistician. Reproductive health. Screening  You may have the following tests or measurements: Height, weight, and BMI. Blood pressure. Lipid and cholesterol levels. These may be checked every 5 years, or more frequently if you are over 86 years old. Skin check. Lung cancer screening. You may have this screening every year starting at age 86 if you have a 30-pack-year history of smoking and currently smoke or have quit within the past 15 years. Fecal occult blood test (FOBT) of the stool. You may have this test every year starting at age 86. Flexible sigmoidoscopy or colonoscopy. You may have a sigmoidoscopy every 5 years or a colonoscopy every 10 years starting at age 86. Hepatitis C blood test. Hepatitis B blood test. Sexually transmitted disease (STD) testing. Diabetes screening. This is done by checking your blood sugar (glucose) after you have not eaten for a while (fasting). You may have this done every 1-3 years. Bone density scan. This is done to screen for osteoporosis. You may have this done starting at age 86. Mammogram. This may be done every 1-2 years. Talk to your health care provider about how often you should have regular mammograms. Talk with your health care provider about your test results, treatment options,  and if necessary, the need for more tests. Vaccines  Your health care provider may recommend certain vaccines, such as: Influenza vaccine. This is  recommended every year. Tetanus, diphtheria, and acellular pertussis (Tdap, Td) vaccine. You may need a Td booster every 10 years. Zoster vaccine. You may need this after age 86. Pneumococcal 13-valent conjugate (PCV13) vaccine. One dose is recommended after age 86. Pneumococcal polysaccharide (PPSV23) vaccine. One dose is recommended after age 86. Talk to your health care provider about which screenings and vaccines you need and how often you need them. This information is not intended to replace advice given to you by your health care provider. Make sure you discuss any questions you have with your health care provider. Document Released: 01/13/2016 Document Revised: 09/05/2016 Document Reviewed: 10/18/2015 Elsevier Interactive Patient Education  2017 [employer] Prevention in the Home Falls can cause injuries. They can happen to people of all ages. There are many things you can do to make your home safe and to help prevent falls. What can I do on the outside of my home? Regularly fix the edges of walkways and driveways and fix any cracks. Remove anything that might make you trip as you walk through a door, such as a raised step or threshold. Trim any bushes or trees on the path to your home. Use bright outdoor lighting. Clear any walking paths of anything that might make someone trip, such as rocks or tools. Regularly check to see if handrails are loose or broken. Make sure that both sides of any steps have handrails. Any raised decks and porches should have guardrails on the edges. Have any leaves, snow, or ice cleared regularly. Use sand or salt on walking paths during winter. Clean up any spills in your garage right away. This includes oil or grease spills. What can I do in the bathroom? Use night lights. Install grab bars by the toilet and in the tub and shower. Do not use towel bars as grab bars. Use non-skid mats or decals in the tub or shower. If you need to sit down in  the shower, use a plastic, non-slip stool. Keep the floor dry. Clean up any water that spills on the floor as soon as it happens. Remove soap buildup in the tub or shower regularly. Attach bath mats securely with double-sided non-slip rug tape. Do not have throw rugs and other things on the floor that can make you trip. What can I do in the bedroom? Use night lights. Make sure that you have a light by your bed that is easy to reach. Do not use any sheets or blankets that are too big for your bed. They should not hang down onto the floor. Have a firm chair that has side arms. You can use this for support while you get dressed. Do not have throw rugs and other things on the floor that can make you trip. What can I do in the kitchen? Clean up any spills right away. Avoid walking on wet floors. Keep items that you use a lot in easy-to-reach places. If you need to reach something above you, use a strong step stool that has a grab bar. Keep electrical cords out of the way. Do not use floor polish or wax that makes floors slippery. If you must use wax, use non-skid floor wax. Do not have throw rugs and other things on the floor that can make you trip. What can I do with my stairs? Do not leave  any items on the stairs. Make sure that there are handrails on both sides of the stairs and use them. Fix handrails that are broken or loose. Make sure that handrails are as long as the stairways. Check any carpeting to make sure that it is firmly attached to the stairs. Fix any carpet that is loose or worn. Avoid having throw rugs at the top or bottom of the stairs. If you do have throw rugs, attach them to the floor with carpet tape. Make sure that you have a light switch at the top of the stairs and the bottom of the stairs. If you do not have them, ask someone to add them for you. What else can I do to help prevent falls? Wear shoes that: Do not have high heels. Have rubber bottoms. Are comfortable  and fit you well. Are closed at the toe. Do not wear sandals. If you use a stepladder: Make sure that it is fully opened. Do not climb a closed stepladder. Make sure that both sides of the stepladder are locked into place. Ask someone to hold it for you, if possible. Clearly mark and make sure that you can see: Any grab bars or handrails. First and last steps. Where the edge of each step is. Use tools that help you move around (mobility aids) if they are needed. These include: Canes. Walkers. Scooters. Crutches. Turn on the lights when you go into a dark area. Replace any light bulbs as soon as they burn out. Set up your furniture so you have a clear path. Avoid moving your furniture around. If any of your floors are uneven, fix them. If there are any pets around you, be aware of where they are. Review your medicines with your doctor. Some medicines can make you feel dizzy. This can increase your chance of falling. Ask your doctor what other things that you can do to help prevent falls. This information is not intended to replace advice given to you by your health care provider. Make sure you discuss any questions you have with your health care provider. Document Released: 10/13/2009 Document Revised: 05/24/2016 Document Reviewed: 01/21/2015 Elsevier Interactive Patient Education  2017 [employer].

## 2022-08-08 NOTE — Progress Notes (Signed)
Subjective:   Megan Chapman is a 88 y.o. female who presents for Medicare Annual (Subsequent) preventive examination.  Virtual Visit via Telephone Note  I connected with  Megan Chapman on 08/08/22 at 10:30 AM EDT by telephone and verified that I am speaking with the correct person using two identifiers.  Location: Patient: Home Provider: [facility] Persons participating in the virtual visit: patient/Nurse Health Advisor   I discussed the limitations, risks, security and privacy concerns of performing an evaluation and management service by telephone and the availability of in person appointments. The patient expressed understanding and agreed to proceed.  Interactive audio and video telecommunications were attempted between this nurse and patient, however failed, due to patient having technical difficulties OR patient did not have access to video capability.  We continued and completed visit with audio only.  Some vital signs may be absent or patient reported.   Megan Chapman E Brion Hedges, LPN   Review of Systems     Cardiac Risk Factors include: advanced age (>3mn, >>5women);dyslipidemia;hypertension     Objective:    Today's Vitals   08/08/22 1031  Weight: 146 lb (66.2 kg)   Body mass index is 26.7 kg/m.     07/07/2021   11:38 AM 07/06/2020    9:24 AM 06/04/2019    9:58 AM 10/22/2017    2:48 PM 08/22/2016    5:08 PM 08/22/2016   11:24 AM 04/07/2015    3:09 PM  Advanced Directives  Does Patient Have a Medical Advance Directive? Yes Yes Yes Yes Yes Yes Yes  Type of AParamed[place]of [place] will;Out of facility DNR (pink MOST or yellow form) [place]  [place] will [place] will Living will;Healthcare Power of Attorney  Does patient want to make changes to medical advance directive?  No - Patient declined No - Patient declined  No - Patient  declined  No - Patient declined  Copy of [place] Chart? No - copy requested No - copy requested No - copy requested  No - copy requested No - copy requested No - copy requested    Current Medications (verified) Outpatient Encounter Medications as of 08/08/2022  Medication Sig   acetaminophen (TYLENOL) 500 MG tablet Take 1 tablet (500 mg total) by mouth every 6 (six) hours as needed.   atorvastatin (LIPITOR) 40 MG tablet TAKE 1 TABLET DAILY   betamethasone acetate-betamethasone sodium phosphate (CELESTONE) 6 (3-3) MG/ML injection Inject into the articular space.   Cholecalciferol (VITAMIN D3) 1000 UNITS CAPS Take 2 capsules (2,000 Units total) by mouth daily.   diclofenac (VOLTAREN) 75 MG EC tablet Take 1 tablet (75 mg total) by mouth 2 (two) times daily. For muscle and  Joint pain   DULoxetine (CYMBALTA) 30 MG capsule TAKE 1 CAPSULE DAILY FOR ARTHRITIS   levothyroxine (SYNTHROID) 25 MCG tablet Take 1 tablet (25 mcg total) by mouth daily before breakfast.   Omega-3 Fatty Acids (FISH OIL) 1200 MG CAPS Take 2 capsules by mouth daily.   pantoprazole (PROTONIX) 40 MG tablet Take 1 tablet (40 mg total) by mouth 2 (two) times daily. 30 minutes before lunch and 30 minutes before dinner   QUEtiapine (SEROQUEL) 25 MG tablet Take 1 tablet (25 mg total) by mouth at bedtime. (Patient not taking: Reported on 08/08/2022)   No facility-administered encounter medications on file as of 08/08/2022.    Allergies (verified) Amoxicillin, Biaxin [clarithromycin], Ciprofloxacin, Codeine,  Nitrofurantoin, and Sulfa antibiotics   History: Past Medical History:  Diagnosis Date   Allergy    Anxiety    Arthritis    Asthma    not treated   Basal cell carcinoma    Cataract    GERD (gastroesophageal reflux disease)    Not treated   HTN (hypertension)    Hyperlipidemia    Migraine    Palpitations    Thyroid disease    hypothyroid   Past Surgical History:  Procedure Laterality Date    ABDOMINAL HYSTERECTOMY     partial   APPENDECTOMY     BASAL CELL CARCINOMA EXCISION  2017   nose   CATARACT EXTRACTION Bilateral    COLONOSCOPY     MENISCUS REPAIR Right    03/11/15   PARTIAL KNEE ARTHROPLASTY Left    TONSILLECTOMY     Family History  Problem Relation Age of Onset   Colon cancer Mother    Cancer - Colon Mother        w/ mets to lung   Prostate cancer Father        w/ mets to bone   Cancer Brother    Stroke Brother    Migraines Brother    COPD Son    Migraines Son    Down syndrome Daughter    Esophageal cancer Neg Hx    Rectal cancer Neg Hx    Stomach cancer Neg Hx    Social History   Socioeconomic History   Marital status: Widowed    Spouse name: Not on file   Number of children: 4   Years of education: 10   Highest education level: GED or equivalent  Occupational History   Occupation: retired  Tobacco Use   Smoking status: Never   Smokeless tobacco: Never  Vaping Use   Vaping Use: Never used  Substance and Sexual Activity   Alcohol use: No   Drug use: No   Sexual activity: Not Currently    Birth control/protection: Surgical  Other Topics Concern   Not on file  Social History Narrative   Lives alone.  Widow twice.  Worked until 82 years.     Son, Megan Chapman is with her almost daily.   Social Determinants of Health   Financial Resource Strain: Low Risk  (08/08/2022)   Overall Financial Resource Strain (CARDIA)    Difficulty of Paying Living Expenses: Not hard at all  Food Insecurity: No Food Insecurity (08/08/2022)   Hunger Vital Sign    Worried About Running Out of Food in the Last Year: Never true    Ran Out of Food in the Last Year: Never true  Transportation Needs: No Transportation Needs (08/08/2022)   PRAPARE - Hydrologist (Medical): No    Lack of Transportation (Non-Medical): No  Physical Activity: Insufficiently Active (08/08/2022)   Exercise Vital Sign    Days of Exercise per Week: 7 days    Minutes of  Exercise per Session: 20 min  Stress: No Stress Concern Present (08/08/2022)   [place]    Feeling of Stress : Not at all  Social Connections: Moderately Integrated (08/08/2022)   Social Connection and Isolation Panel [NHANES]    Frequency of Communication with Friends and Family: More than three times a week    Frequency of Social Gatherings with Friends and Family: More than three times a week    Attends Religious Services: More than 4 times per  year    Active Member of Clubs or Organizations: Yes    Attends Archivist Meetings: More than 4 times per year    Marital Status: Widowed    Tobacco Counseling Counseling given: Not Answered   Clinical Intake:  Pre-visit preparation completed: Yes  Pain : No/denies pain     BMI - recorded: 26.7 Nutritional Status: BMI 25 -29 Overweight Nutritional Risks: None Diabetes: No  How often do you need to have someone help you when you read instructions, pamphlets, or other written materials from your doctor or pharmacy?: 1 - Never  Diabetic? no  Interpreter Needed?: No  Information entered by :: Megan Raske, LPN   Activities of Daily Living    08/08/2022   10:38 AM  In your present state of health, do you have any difficulty performing the following activities:  Hearing? 1  Comment wears hearing aids  Vision? 0  Difficulty concentrating or making decisions? 0  Walking or climbing stairs? 0  Dressing or bathing? 0  Doing errands, shopping? 0  Preparing Food and eating ? N  Using the Toilet? N  In the past six months, have you accidently leaked urine? N  Do you have problems with loss of bowel control? N  Managing your Medications? N  Managing your Finances? N  Housekeeping or managing your Housekeeping? N    Patient Care Team: [name], MD as PCP - General (Family Medicine) Levy Sjogren, MD as Referring Physician  (Dermatology) Henreitta Leber, MD as Referring Physician (Orthopedic Surgery) Ellan Lambert, MD as Referring Physician (Plastic Surgery)  Indicate any recent Medical Services you may have received from other than [facility] providers in the past year (date may be approximate).     Assessment:   This is a routine wellness examination for Megan Chapman.  Hearing/Vision screen Hearing Screening - Comments:: Wears hearing aids - from [facility]:: Wears rx glasses - up to date with routine eye exams with [facility] in [place] issues and exercise activities discussed: Current Exercise Habits: Home exercise routine, Type of exercise: walking;stretching, Time (Minutes): 20, Frequency (Times/Week): 7, Weekly Exercise (Minutes/Week): 140, Intensity: Mild, Exercise limited by: None identified   Goals Addressed             This Visit's Progress    DIET - REDUCE SODIUM INTAKE   On track    Patient Stated   On track    08/08/2022 AWV Goal: Keep All Scheduled Appointments  Over the next year, patient will attend all scheduled appointments with their PCP and any specialists that they see.        Depression Screen    08/08/2022   10:37 AM 06/06/2022    9:59 AM 12/06/2021    8:32 AM 09/26/2021    9:57 AM 07/07/2021   11:39 AM 06/06/2021    8:36 AM 02/14/2021    2:47 PM  PHQ 2/9 Scores  PHQ - 2 Score 0 0 0 0 0 0 0  PHQ- 9 Score    0  2     Fall Risk    08/08/2022   10:34 AM 06/06/2022   10:06 AM 06/06/2022    9:59 AM 12/06/2021    8:32 AM 09/26/2021    9:56 AM  Fall Risk   Falls in the past year? 1 1 0 0 1  Number falls in past yr: 0 0   1  Injury with Fall? 0 0   1  Risk  for fall due to : Orthopedic patient;History of fall(s)    Impaired balance/gait;Mental status change  Follow up Falls prevention discussed Falls evaluation completed   Falls prevention discussed    FALL RISK PREVENTION PERTAINING TO THE HOME:  Any stairs in or around the home? Yes  If so,  are there any without handrails? No  Home free of loose throw rugs in walkways, pet beds, electrical cords, etc? Yes  Adequate lighting in your home to reduce risk of falls? Yes   ASSISTIVE DEVICES UTILIZED TO PREVENT FALLS:  Life alert? No  Use of a cane, walker or w/c? No  Grab bars in the bathroom? Yes  Shower chair or bench in shower? Yes  Elevated toilet seat or a handicapped toilet? Yes   TIMED UP AND GO:  Was the test performed? No . Telephonic visit  Cognitive Function:    08/22/2016   11:15 AM 04/07/2015    3:24 PM  MMSE - Mini Mental State Exam  Orientation to time 5 5  Orientation to Place 5 5  Registration 3 3  Attention/ Calculation 5 5  Recall 2 1  Language- name 2 objects 2 2  Language- repeat 1 1  Language- follow 3 step command 3 3  Language- read & follow direction 1 1  Write a sentence 1 1  Copy design 1 1  Total score 29 28        08/08/2022   10:39 AM 07/07/2021   11:48 AM 07/06/2020    9:36 AM 06/04/2019   10:02 AM  6CIT Screen  What Year? 0 points 0 points 0 points 0 points  What month? 0 points 0 points 0 points 0 points  What time? 0 points 3 points 0 points 0 points  Count back from 20 0 points 0 points 0 points 0 points  Months in reverse 0 points 0 points 0 points 0 points  Repeat phrase 0 points 4 points 2 points 2 points  Total Score 0 points 7 points 2 points 2 points    Immunizations Immunization History  Administered Date(s) Administered   Fluad Quad(high Dose 65+) 09/29/2019, 12/06/2020, 12/06/2021   Influenza Split 10/14/2020   Influenza, High Dose Seasonal PF 10/14/2017, 10/14/2018   Influenza,inj,Quad PF,6+ Mos 11/15/2014, 10/12/2016   Moderna SARS-COV2 Booster Vaccination 12/28/2020   Moderna Sars-Covid-2 Vaccination 01/13/2020, 02/10/2020   Pneumococcal Conjugate-13 04/14/2018   Pneumococcal Polysaccharide-23 11/30/2002   Td 05/31/2005    TDAP status: Due, Education has been provided regarding the importance of this  vaccine. Advised may receive this vaccine at local pharmacy or Health Dept. Aware to provide a copy of the vaccination record if obtained from local pharmacy or Health Dept. Verbalized acceptance and understanding.  Flu Vaccine status: Up to date  Pneumococcal vaccine status: Up to date  Covid-19 vaccine status: Completed vaccines  Qualifies for Shingles Vaccine? Yes   Zostavax completed No   Shingrix Completed?: No.    Education has been provided regarding the importance of this vaccine. Patient has been advised to call insurance company to determine out of pocket expense if they have not yet received this vaccine. Advised may also receive vaccine at local pharmacy or Health Dept. Verbalized acceptance and understanding.  Screening Tests Health Maintenance  Topic Date Due   TETANUS/TDAP  06/01/2015   COVID-19 Vaccine (3 - Moderna series) 02/22/2021   INFLUENZA VACCINE  07/31/2022   Zoster Vaccines- Shingrix (1 of 2) 09/06/2022 (Originally 10/11/1981)   Pneumonia Vaccine 70+ Years old  Completed   DEXA SCAN  Completed   HPV VACCINES  Aged Out    Health Maintenance  Health Maintenance Due  Topic Date Due   TETANUS/TDAP  06/01/2015   COVID-19 Vaccine (3 - Moderna series) 02/22/2021   INFLUENZA VACCINE  07/31/2022    Colorectal cancer screening: No longer required.   Mammogram status: No longer required due to age.  Bone Density status: Completed 2016. Results reflect: Bone density results: NORMAL. Repeat every 5 years. But no longer required  Lung Cancer Screening: (Low Dose CT Chest recommended if Age 57-80 years, 30 pack-year currently smoking OR have quit w/in 15years.) does not qualify.   Additional Screening:  Hepatitis C Screening: does not qualify  Vision Screening: Recommended annual ophthalmology exams for early detection of glaucoma and other disorders of the eye. Is the patient up to date with their annual eye exam?  Yes  Who is the provider or what is the name  of the office in which the patient attends annual eye exams? [facility] in [place] If pt is not established with a provider, would they like to be referred to a provider to establish care? No .   Dental Screening: Recommended annual dental exams for proper oral hygiene  Community Resource Referral / Chronic Care Management: CRR required this visit?  No   CCM required this visit?  No      Plan:     I have personally reviewed and noted the following in the patient's chart:   Medical and social history Use of alcohol, tobacco or illicit drugs  Current medications and supplements including opioid prescriptions.  Functional ability and status Nutritional status Physical activity Advanced directives List of other physicians Hospitalizations, surgeries, and ER visits in previous 12 months Vitals Screenings to include cognitive, depression, and falls Referrals and appointments  In addition, I have reviewed and discussed with patient certain preventive protocols, quality metrics, and best practice recommendations. A written personalized care plan for preventive services as well as general preventive health recommendations were provided to patient.     Sandrea Hammond, LPN   06/02/8465   Nurse Notes: She didn't take Seroquel - she doesn't know why it was given to her - she read the reasons and side effects and doesn't feel comfortable taking or feel like she needs it.

## 2022-09-03 ENCOUNTER — Other Ambulatory Visit: Payer: Self-pay | Admitting: Family Medicine

## 2022-09-10 ENCOUNTER — Ambulatory Visit: Payer: Medicare Other | Admitting: Family Medicine

## 2022-09-17 ENCOUNTER — Encounter: Payer: Self-pay | Admitting: Family Medicine

## 2022-09-17 ENCOUNTER — Ambulatory Visit (INDEPENDENT_AMBULATORY_CARE_PROVIDER_SITE_OTHER): Payer: Medicare Other | Admitting: Family Medicine

## 2022-09-17 VITALS — BP 137/66 | HR 74 | Temp 97.4°F | Ht 62.0 in | Wt 144.2 lb

## 2022-09-17 DIAGNOSIS — E038 Other specified hypothyroidism: Secondary | ICD-10-CM

## 2022-09-17 DIAGNOSIS — E782 Mixed hyperlipidemia: Secondary | ICD-10-CM

## 2022-09-17 DIAGNOSIS — F419 Anxiety disorder, unspecified: Secondary | ICD-10-CM | POA: Diagnosis not present

## 2022-09-17 DIAGNOSIS — I10 Essential (primary) hypertension: Secondary | ICD-10-CM

## 2022-09-17 DIAGNOSIS — Z23 Encounter for immunization: Secondary | ICD-10-CM | POA: Diagnosis not present

## 2022-09-17 DIAGNOSIS — M159 Polyosteoarthritis, unspecified: Secondary | ICD-10-CM | POA: Diagnosis not present

## 2022-09-17 MED ORDER — ATORVASTATIN CALCIUM 40 MG PO TABS: 40.0000 mg | ORAL_TABLET | Freq: Every day | ORAL | 2 refills | Status: DC

## 2022-09-17 MED ORDER — DULOXETINE HCL 30 MG PO CPEP: 30.0000 mg | ORAL_CAPSULE | Freq: Every evening | ORAL | 2 refills | Status: DC

## 2022-09-17 NOTE — Progress Notes (Signed)
Subjective:  Patient ID: Megan Chapman, female    DOB: 09/11/1931  Age: 88 y.o. MRN: [id number]  CC: Medical Management of Chronic Issues   HPI Megan Chapman presents for follow-up of elevated cholesterol. Doing well without complaints on current medication. Denies side effects of statin including myalgia and arthralgia and nausea. Also in today for liver function testing. Currently no chest pain, shortness of breath or other cardiovascular related symptoms noted. History Megan Chapman has a past medical history of Allergy, Anxiety, Arthritis, Asthma, Basal cell carcinoma, Cataract, GERD (gastroesophageal reflux disease), HTN (hypertension), Hyperlipidemia, Migraine, Palpitations, and Thyroid disease.   She has a past surgical history that includes Partial knee arthroplasty (Left); Abdominal hysterectomy; Meniscus repair (Right); Excision basal cell carcinoma (2017); Cataract extraction (Bilateral); Appendectomy; Tonsillectomy; and Colonoscopy.   Her family history includes COPD in her son; Cancer in her brother; Cancer - Colon in her mother; Colon cancer in her mother; Down syndrome in her daughter; Migraines in her brother and son; Prostate cancer in her father; Stroke in her brother.She reports that she has never smoked. She has never used smokeless tobacco. She reports that she does not drink alcohol and does not use drugs.  Current Outpatient Medications on File Prior to Visit  Medication Sig Dispense Refill   acetaminophen (TYLENOL) 500 MG tablet Take 1 tablet (500 mg total) by mouth every 6 (six) hours as needed. 30 tablet 0   Cholecalciferol (VITAMIN D3) 1000 UNITS CAPS Take 2 capsules (2,000 Units total) by mouth daily. 30 capsule 11   diclofenac (VOLTAREN) 75 MG EC tablet Take 1 tablet (75 mg total) by mouth 2 (two) times daily. For muscle and  Joint pain 60 tablet 2   levothyroxine (SYNTHROID) 25 MCG tablet Take 1 tablet (25 mcg total) by mouth daily before breakfast. 90 tablet 3   Omega-3  Fatty Acids (FISH OIL) 1200 MG CAPS Take 2 capsules by mouth daily.     pantoprazole (PROTONIX) 40 MG tablet Take 1 tablet (40 mg total) by mouth 2 (two) times daily. 30 minutes before lunch and 30 minutes before dinner 90 tablet 1   No current facility-administered medications on file prior to visit.    ROS Review of Systems  Constitutional: Negative.   HENT: Negative.    Eyes:  Negative for visual disturbance.  Respiratory:  Negative for shortness of breath.   Cardiovascular:  Negative for chest pain.  Gastrointestinal:  Negative for abdominal pain.  Musculoskeletal:  Negative for arthralgias.    Objective:  BP 137/66   Pulse 74   Temp (!) 97.4 F (36.3 C)   Ht '5\' 2"'  (1.575 m)   Wt 144 lb 3.2 oz (65.4 kg)   SpO2 96%   BMI 26.37 kg/m   BP Readings from Last 3 Encounters:  09/17/22 137/66  06/06/22 (!) 165/74  05/16/22 (!) 148/76    Wt Readings from Last 3 Encounters:  09/17/22 144 lb 3.2 oz (65.4 kg)  08/08/22 146 lb (66.2 kg)  06/06/22 146 lb 12.8 oz (66.6 kg)     Physical Exam Constitutional:      General: She is not in acute distress.    Appearance: She is well-developed.  Cardiovascular:     Rate and Rhythm: Normal rate and regular rhythm.  Pulmonary:     Breath sounds: Normal breath sounds.  Musculoskeletal:        General: Normal range of motion.  Skin:    General: Skin is warm and dry.  Neurological:  Mental Status: She is alert and oriented to person, place, and time.     No results found for: "HGBA1C"  Lab Results  Component Value Date   WBC 3.9 06/06/2022   HGB 12.6 06/06/2022   HCT 38.3 06/06/2022   PLT 214 06/06/2022   GLUCOSE 91 06/06/2022   CHOL 184 06/06/2022   TRIG 194 (H) 06/06/2022   HDL 51 06/06/2022   LDLCALC 100 (H) 06/06/2022   ALT 14 06/06/2022   AST 15 06/06/2022   NA 139 06/06/2022   K 4.8 06/06/2022   CL 101 06/06/2022   CREATININE 0.92 06/06/2022   BUN 15 06/06/2022   CO2 24 06/06/2022   TSH 3.340 06/06/2022     MR CERVICAL SPINE WO CONTRAST  Result Date: 10/18/2020 CLINICAL DATA:  Cervical radiculopathy. Cervical spine osteoarthritis. EXAM: MRI CERVICAL SPINE WITHOUT CONTRAST TECHNIQUE: Multiplanar, multisequence MR imaging of the cervical spine was performed. No intravenous contrast was administered. COMPARISON:  Cervical spine radiograph 07/13/2020 FINDINGS: Alignment: 3 mm retrolisthesis C3 on C4. 4 mm retrolisthesis C5-6. 4 mm anterolisthesis C7-T1. Alignment was normal on the prior radiograph suggesting instability at these levels. Vertebrae: Negative for fracture or mass. Cord: Normal spinal cord signal. Posterior Fossa, vertebral arteries, paraspinal tissues: Negative Disc levels: C2-3: Moderate facet degeneration on the left with mild left foraminal stenosis. Spinal canal normal in size C3-4: Disc degeneration with diffuse uncinate spurring. Mild facet degeneration. Mild foraminal narrowing bilaterally. Spinal canal normal in size C4-5: Disc degeneration with diffuse uncinate spurring. Mild foraminal narrowing bilaterally. C5-6: Disc degeneration with prominent uncinate spurring diffusely. Central disc protrusion may be partially calcified. There is cord flattening with moderate spinal stenosis and moderate foraminal stenosis bilaterally. C6-7: Disc degeneration with diffuse uncinate spurring. Mild spinal stenosis and mild foraminal stenosis bilaterally. C7-T1: 4 mm anterolisthesis with advanced facet degeneration. Moderate foraminal narrowing bilaterally and mild spinal stenosis. IMPRESSION: Multilevel spondylolisthesis in the cervical spine. Alignment normal on cervical spine x-rays earlier this year. This suggests instability multiple levels due to degenerative change. Multilevel degenerative change throughout the cervical spine with prominent spurring and facet hypertrophy at multiple levels. Multilevel spinal and foraminal stenosis as described above. Electronically Signed   By: Franchot Gallo M.D.    On: 10/18/2020 15:33    Assessment & Plan:   Megan Chapman was seen today for medical management of chronic issues.  Diagnoses and all orders for this visit:  Essential hypertension -     CBC with Differential/Platelet -     CMP14+EGFR  Primary osteoarthritis involving multiple joints -     DULoxetine (CYMBALTA) 30 MG capsule; Take 1 capsule (30 mg total) by mouth every evening.  Anxiety -     DULoxetine (CYMBALTA) 30 MG capsule; Take 1 capsule (30 mg total) by mouth every evening.  Mixed hyperlipidemia -     Lipid panel  Other specified hypothyroidism -     TSH + free T4  Other orders -     atorvastatin (LIPITOR) 40 MG tablet; Take 1 tablet (40 mg total) by mouth daily.   I have discontinued Megan Chapman's QUEtiapine and betamethasone acetate-betamethasone sodium phosphate. I have also changed her atorvastatin and DULoxetine. Additionally, I am having her maintain her Vitamin D3, Fish Oil, acetaminophen, diclofenac, pantoprazole, and levothyroxine.  Meds ordered this encounter  Medications   atorvastatin (LIPITOR) 40 MG tablet    Sig: Take 1 tablet (40 mg total) by mouth daily.    Dispense:  90 tablet    Refill:  2  DULoxetine (CYMBALTA) 30 MG capsule    Sig: Take 1 capsule (30 mg total) by mouth every evening.    Dispense:  90 capsule    Refill:  2     Follow-up: Return in about 6 months (around 03/18/2023) for Arthritis, cholesterol, hypertension.  [name], M.D.

## 2022-09-18 LAB — LIPID PANEL
Chol/HDL Ratio: 2.9 ratio (ref 0.0–4.4)
Cholesterol, Total: 185 mg/dL (ref 100–199)
HDL: 63 mg/dL (ref >39)
LDL Chol Calc (NIH): 96 mg/dL (ref 0–99)
Triglycerides: 150 mg/dL — ABNORMAL HIGH (ref 0–149)
VLDL Cholesterol Cal: 26 mg/dL (ref 5–40)

## 2022-09-18 LAB — CMP14+EGFR
ALT: 13 IU/L (ref 0–32)
AST: 17 IU/L (ref 0–40)
Albumin/Globulin Ratio: 1.9 (ref 1.2–2.2)
Albumin: 4.6 g/dL (ref 3.7–4.7)
Alkaline Phosphatase: 78 IU/L (ref 44–121)
BUN/Creatinine Ratio: 18 (ref 12–28)
BUN: 17 mg/dL (ref 8–27)
Bilirubin Total: 0.8 mg/dL (ref 0.0–1.2)
CO2: 21 mmol/L (ref 20–29)
Calcium: 10.1 mg/dL (ref 8.7–10.3)
Chloride: 100 mmol/L (ref 96–106)
Creatinine, Ser: 0.96 mg/dL (ref 0.57–1.00)
Globulin, Total: 2.4 g/dL (ref 1.5–4.5)
Glucose: 96 mg/dL (ref 70–99)
Potassium: 4.9 mmol/L (ref 3.5–5.2)
Sodium: 139 mmol/L (ref 134–144)
Total Protein: 7 g/dL (ref 6.0–8.5)
eGFR: 57 mL/min/{1.73_m2} — ABNORMAL LOW (ref >59)

## 2022-09-18 LAB — CBC WITH DIFFERENTIAL/PLATELET
Basophils Absolute: 0 10*3/uL (ref 0.0–0.2)
Basos: 0 %
EOS (ABSOLUTE): 0 10*3/uL (ref 0.0–0.4)
Eos: 1 %
Hematocrit: 37 % (ref 34.0–46.6)
Hemoglobin: 12.2 g/dL (ref 11.1–15.9)
Immature Grans (Abs): 0 10*3/uL (ref 0.0–0.1)
Immature Granulocytes: 1 %
Lymphocytes Absolute: 1.3 10*3/uL (ref 0.7–3.1)
Lymphs: 36 %
MCH: 28.8 pg (ref 26.6–33.0)
MCHC: 33 g/dL (ref 31.5–35.7)
MCV: 88 fL (ref 79–97)
Monocytes Absolute: 0.7 10*3/uL (ref 0.1–0.9)
Monocytes: 18 %
Neutrophils Absolute: 1.6 10*3/uL (ref 1.4–7.0)
Neutrophils: 44 %
Platelets: 198 10*3/uL (ref 150–450)
RBC: 4.23 x10E6/uL (ref 3.77–5.28)
RDW: 13.7 % (ref 11.7–15.4)
WBC: 3.6 10*3/uL (ref 3.4–10.8)

## 2022-09-18 LAB — TSH+FREE T4
Free T4: 1.11 ng/dL (ref 0.82–1.77)
TSH: 3.12 u[IU]/mL (ref 0.450–4.500)

## 2022-09-18 NOTE — Progress Notes (Signed)
Hello Niki,  Your lab result is normal and/or stable.Some minor variations that are not significant are commonly marked abnormal, but do not represent any medical problem for you.  Best regards, [name], M.D.

## 2022-10-31 DIAGNOSIS — H527 Unspecified disorder of refraction: Secondary | ICD-10-CM | POA: Diagnosis not present

## 2022-10-31 DIAGNOSIS — H26491 Other secondary cataract, right eye: Secondary | ICD-10-CM | POA: Diagnosis not present

## 2022-10-31 DIAGNOSIS — H35373 Puckering of macula, bilateral: Secondary | ICD-10-CM | POA: Diagnosis not present

## 2022-10-31 DIAGNOSIS — H353122 Nonexudative age-related macular degeneration, left eye, intermediate dry stage: Secondary | ICD-10-CM | POA: Diagnosis not present

## 2022-10-31 DIAGNOSIS — H43813 Vitreous degeneration, bilateral: Secondary | ICD-10-CM | POA: Diagnosis not present

## 2022-11-20 DIAGNOSIS — B079 Viral wart, unspecified: Secondary | ICD-10-CM | POA: Diagnosis not present

## 2022-12-18 ENCOUNTER — Telehealth: Payer: Self-pay | Admitting: Family Medicine

## 2022-12-18 NOTE — Telephone Encounter (Signed)
Pt called wanting advise on what's best for her to take for the flu? 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

## 2022-12-19 ENCOUNTER — Other Ambulatory Visit: Payer: Self-pay | Admitting: Family Medicine

## 2022-12-19 DIAGNOSIS — J209 Acute bronchitis, unspecified: Secondary | ICD-10-CM | POA: Diagnosis not present

## 2022-12-19 MED ORDER — OSELTAMIVIR PHOSPHATE 75 MG PO CAPS: 75.0000 mg | ORAL_CAPSULE | Freq: Every day | ORAL | 0 refills | Status: DC

## 2022-12-19 NOTE — Telephone Encounter (Signed)
Pt aware.

## 2022-12-19 NOTE — Telephone Encounter (Signed)
Tamiflu prevention dose sent to [facility]

## 2023-01-02 ENCOUNTER — Ambulatory Visit (INDEPENDENT_AMBULATORY_CARE_PROVIDER_SITE_OTHER): Payer: Medicare Other | Admitting: Family Medicine

## 2023-01-02 ENCOUNTER — Ambulatory Visit (INDEPENDENT_AMBULATORY_CARE_PROVIDER_SITE_OTHER): Payer: Medicare Other

## 2023-01-02 ENCOUNTER — Encounter: Payer: Self-pay | Admitting: Family Medicine

## 2023-01-02 VITALS — BP 135/67 | HR 79 | Temp 97.7°F | Ht 62.0 in | Wt 139.1 lb

## 2023-01-02 DIAGNOSIS — R0602 Shortness of breath: Secondary | ICD-10-CM

## 2023-01-02 DIAGNOSIS — R059 Cough, unspecified: Secondary | ICD-10-CM | POA: Diagnosis not present

## 2023-01-02 DIAGNOSIS — J441 Chronic obstructive pulmonary disease with (acute) exacerbation: Secondary | ICD-10-CM | POA: Diagnosis not present

## 2023-01-02 MED ORDER — GUAIFENESIN ER 600 MG PO TB12: 1200.0000 mg | ORAL_TABLET | Freq: Two times a day (BID) | ORAL | 0 refills | Status: DC | PRN

## 2023-01-02 MED ORDER — ALBUTEROL SULFATE HFA 108 (90 BASE) MCG/ACT IN AERS: 1.0000 | INHALATION_SPRAY | Freq: Four times a day (QID) | RESPIRATORY_TRACT | 0 refills | Status: DC | PRN

## 2023-01-02 MED ORDER — PREDNISONE 20 MG PO TABS: 20.0000 mg | ORAL_TABLET | Freq: Every day | ORAL | 0 refills | Status: AC

## 2023-01-02 MED ORDER — DOXYCYCLINE HYCLATE 100 MG PO TABS: 100.0000 mg | ORAL_TABLET | Freq: Two times a day (BID) | ORAL | 0 refills | Status: AC

## 2023-01-02 NOTE — Patient Instructions (Signed)

## 2023-01-02 NOTE — Progress Notes (Signed)
Acute Office Visit  Subjective:     Patient ID: Megan Chapman, female    DOB: Jul 15, 1931, 89 y.o.   MRN: [id number]  Chief Complaint  Patient presents with   Cough   Shortness of Breath    HPI Patient is in today for shortness of breath for the last few days with coughing and activity. She has had a cough for 2 weeks. She has had pain on the left side of her back and lower chest with coughing. Cough is nonproductive. Denies fever, nasal congestion, HA, or sore throat. Denies nausea or vomiting. She tested positive for the flu on 12/19/22 when her cough started. She was treated with mucinex, omnicef, tessalon perles, and tamiflu. She felt better after about a week, then starting feel worse again with worsening cough.   ROS As per HPI.      Objective:    BP 135/67   Pulse 79   Temp 97.7 F (36.5 C) (Temporal)   Ht '5\' 2"'$  (1.575 m)   Wt 139 lb 2 oz (63.1 kg)   SpO2 99%   BMI 25.45 kg/m    Physical Exam Vitals and nursing note reviewed.  Constitutional:      General: She is not in acute distress.    Appearance: She is not ill-appearing, toxic-appearing or diaphoretic.  HENT:     Nose: Congestion present.     Mouth/Throat:     Mouth: Mucous membranes are moist.     Pharynx: No pharyngeal swelling or oropharyngeal exudate.  Cardiovascular:     Rate and Rhythm: Normal rate and regular rhythm.  Pulmonary:     Effort: Pulmonary effort is normal. No tachypnea.     Breath sounds: Examination of the right-lower field reveals rhonchi. Examination of the left-lower field reveals rhonchi. Rhonchi present. No decreased breath sounds, wheezing or rales.  Musculoskeletal:     Cervical back: Neck supple.     Right lower leg: No edema.     Left lower leg: No edema.  Lymphadenopathy:     Cervical: No cervical adenopathy.  Skin:    General: Skin is warm and dry.  Neurological:     Mental Status: She is alert and oriented to person, place, and time.  Psychiatric:        Mood and  Affect: Mood normal.        Behavior: Behavior normal.     No results found for any visits on 01/02/23.      Assessment & Plan:   Megan Chapman was seen today for cough and shortness of breath.  Diagnoses and all orders for this visit:  SOB (shortness of breath) CXR with hyperinflation, emphysema changes. No acute findings.  -     DG Chest 2 View; Future  COPD exacerbation (HCC) Doxycyline and short burst of low dose prednisone ordered as below. Plain mucinex prn. Can try albuterol prn.  -     predniSONE (DELTASONE) 20 MG tablet; Take 1 tablet (20 mg total) by mouth daily with breakfast for 3 days. -     doxycycline (VIBRA-TABS) 100 MG tablet; Take 1 tablet (100 mg total) by mouth 2 (two) times daily for 7 days. -     albuterol (VENTOLIN HFA) 108 (90 Base) MCG/ACT inhaler; Inhale 1-2 puffs into the lungs every 6 (six) hours as needed for wheezing or shortness of breath. -     guaiFENesin (MUCINEX) 600 MG 12 hr tablet; Take 2 tablets (1,200 mg total) by mouth 2 (two) times daily  as needed for cough or to loosen phlegm.  Return to office for new or worsening symptoms, or if symptoms persist.   The patient indicates understanding of these issues and agrees with the plan.   Gwenlyn Perking, FNP

## 2023-01-29 ENCOUNTER — Other Ambulatory Visit: Payer: Self-pay | Admitting: Family Medicine

## 2023-01-29 DIAGNOSIS — J441 Chronic obstructive pulmonary disease with (acute) exacerbation: Secondary | ICD-10-CM

## 2023-03-18 ENCOUNTER — Ambulatory Visit (INDEPENDENT_AMBULATORY_CARE_PROVIDER_SITE_OTHER): Payer: Medicare Other | Admitting: Family Medicine

## 2023-03-18 ENCOUNTER — Encounter: Payer: Self-pay | Admitting: Family Medicine

## 2023-03-18 VITALS — BP 148/74 | HR 74 | Temp 97.8°F | Ht 62.0 in | Wt 141.6 lb

## 2023-03-18 DIAGNOSIS — E038 Other specified hypothyroidism: Secondary | ICD-10-CM | POA: Diagnosis not present

## 2023-03-18 DIAGNOSIS — E782 Mixed hyperlipidemia: Secondary | ICD-10-CM | POA: Diagnosis not present

## 2023-03-18 DIAGNOSIS — I10 Essential (primary) hypertension: Secondary | ICD-10-CM | POA: Diagnosis not present

## 2023-03-18 NOTE — Progress Notes (Signed)
Subjective:  Patient ID: Megan Chapman, female    DOB: 10/22/1931  Age: 89 y.o. MRN: [id number]  CC: Medical Management of Chronic Issues   HPI Flossie Gilbertson presents for  follow-up of hypertension. Patient has no history of headache chest pain or shortness of breath or recent cough. Patient also denies symptoms of TIA such as focal numbness or weakness. Patient denies side effects from medication. States taking it regularly.   follow-up on  thyroid. The patient has a history of hypothyroidism for many years. It has been stable recently. Pt. denies any change in  voice, loss of hair, heat or cold intolerance. Energy level has been adequate to good. Patient denies constipation and diarrhea. No myxedema. Medication is as noted below. Verified that pt is taking it daily on an empty stomach. Well tolerated.  Son in nursing home. Has 4 months to live. Cancer, COPD. Made funeral plans last night. Stressed. Didn't take the [place] has a past medical history of Allergy, Anxiety, Arthritis, Asthma, Basal cell carcinoma, Cataract, GERD (gastroesophageal reflux disease), HTN (hypertension), Hyperlipidemia, Migraine, Palpitations, and Thyroid disease.   She has a past surgical history that includes Partial knee arthroplasty (Left); Abdominal hysterectomy; Meniscus repair (Right); Excision basal cell carcinoma (2017); Cataract extraction (Bilateral); Appendectomy; Tonsillectomy; and Colonoscopy.   Her family history includes COPD in her son; Cancer in her brother; Cancer - Colon in her mother; Colon cancer in her mother; Down syndrome in her daughter; Migraines in her brother and son; Prostate cancer in her father; Stroke in her brother.She reports that she has never smoked. She has never used smokeless tobacco. She reports that she does not drink alcohol and does not use drugs.  Current Outpatient Medications on File Prior to Visit  Medication Sig Dispense Refill   acetaminophen (TYLENOL) 500  MG tablet Take 1 tablet (500 mg total) by mouth every 6 (six) hours as needed. 30 tablet 0   albuterol (VENTOLIN HFA) 108 (90 Base) MCG/ACT inhaler INHALE 1-2 PUFFS BY MOUTH EVERY 6 HOURS AS NEEDED FOR WHEEZE OR SHORTNESS OF BREATH 18 each 2   atorvastatin (LIPITOR) 40 MG tablet Take 1 tablet (40 mg total) by mouth daily. 90 tablet 2   Cholecalciferol (VITAMIN D3) 1000 UNITS CAPS Take 2 capsules (2,000 Units total) by mouth daily. 30 capsule 11   levothyroxine (SYNTHROID) 25 MCG tablet Take 1 tablet (25 mcg total) by mouth daily before breakfast. 90 tablet 3   Omega-3 Fatty Acids (FISH OIL) 1200 MG CAPS Take 2 capsules by mouth daily.     No current facility-administered medications on file prior to visit.    ROS Review of Systems  Constitutional: Negative.   HENT: Negative.    Eyes:  Negative for visual disturbance.  Respiratory:  Negative for shortness of breath.   Cardiovascular:  Negative for chest pain.  Gastrointestinal:  Negative for abdominal pain.  Musculoskeletal:  Negative for arthralgias.    Objective:  BP (!) 148/74   Pulse 74   Temp 97.8 F (36.6 C)   Ht 5\' 2"  (1.575 m)   Wt 141 lb 9.6 oz (64.2 kg)   SpO2 96%   BMI 25.90 kg/m   BP Readings from Last 3 Encounters:  03/18/23 (!) 148/74  01/02/23 135/67  09/17/22 137/66    Wt Readings from Last 3 Encounters:  03/18/23 141 lb 9.6 oz (64.2 kg)  01/02/23 139 lb 2 oz (63.1 kg)  09/17/22 144 lb 3.2 oz (65.4 kg)  Physical Exam Constitutional:      General: She is not in acute distress.    Appearance: She is well-developed.  Cardiovascular:     Rate and Rhythm: Normal rate and regular rhythm.  Pulmonary:     Breath sounds: Normal breath sounds.  Musculoskeletal:        General: Normal range of motion.  Skin:    General: Skin is warm and dry.  Neurological:     Mental Status: She is alert and oriented to person, place, and time.       Assessment & Plan:   Elliora was seen today for medical  management of chronic issues.  Diagnoses and all orders for this visit:  Essential hypertension -     CBC with Differential/Platelet -     CMP14+EGFR  Mixed hyperlipidemia -     Lipid panel  Other specified hypothyroidism -     TSH + free T4   Allergies as of 03/18/2023       Reactions   Amoxicillin    Biaxin [clarithromycin]    Ciprofloxacin Nausea Only   Codeine    Nitrofurantoin Nausea And Vomiting, Nausea Only   Sulfa Antibiotics         Medication List        Accurate as of March 18, 2023  9:17 PM. If you have any questions, ask your nurse or doctor.          STOP taking these medications    diclofenac 75 MG EC tablet Commonly known as: VOLTAREN Stopped by: [name], MD   DULoxetine 30 MG capsule Commonly known as: CYMBALTA Stopped by: [name], MD   guaiFENesin 600 MG 12 hr tablet Commonly known as: Mucinex Stopped by: [name], MD   pantoprazole 40 MG tablet Commonly known as: PROTONIX Stopped by: [name], MD       TAKE these medications    acetaminophen 500 MG tablet Commonly known as: TYLENOL Take 1 tablet (500 mg total) by mouth every 6 (six) hours as needed.   albuterol 108 (90 Base) MCG/ACT inhaler Commonly known as: VENTOLIN HFA INHALE 1-2 PUFFS BY MOUTH EVERY 6 HOURS AS NEEDED FOR WHEEZE OR SHORTNESS OF BREATH   atorvastatin 40 MG tablet Commonly known as: LIPITOR Take 1 tablet (40 mg total) by mouth daily.   Fish Oil 1200 MG Caps Take 2 capsules by mouth daily.   levothyroxine 25 MCG tablet Commonly known as: SYNTHROID Take 1 tablet (25 mcg total) by mouth daily before breakfast.   Vitamin D3 25 MCG (1000 UT) capsule Generic drug: Cholecalciferol Take 2 capsules (2,000 Units total) by mouth daily.        No orders of the defined types were placed in this encounter.     Follow-up: Return in about 6 months (around 09/18/2023).  [name], M.D.

## 2023-03-19 LAB — CMP14+EGFR
ALT: 13 IU/L (ref 0–32)
AST: 14 IU/L (ref 0–40)
Albumin/Globulin Ratio: 2 (ref 1.2–2.2)
Albumin: 4.5 g/dL (ref 3.7–4.7)
Alkaline Phosphatase: 72 IU/L (ref 44–121)
BUN/Creatinine Ratio: 15 (ref 12–28)
BUN: 13 mg/dL (ref 8–27)
Bilirubin Total: 0.4 mg/dL (ref 0.0–1.2)
CO2: 24 mmol/L (ref 20–29)
Calcium: 9.9 mg/dL (ref 8.7–10.3)
Chloride: 102 mmol/L (ref 96–106)
Creatinine, Ser: 0.86 mg/dL (ref 0.57–1.00)
Globulin, Total: 2.3 g/dL (ref 1.5–4.5)
Glucose: 85 mg/dL (ref 70–99)
Potassium: 5 mmol/L (ref 3.5–5.2)
Sodium: 141 mmol/L (ref 134–144)
Total Protein: 6.8 g/dL (ref 6.0–8.5)
eGFR: 65 mL/min/{1.73_m2} (ref >59)

## 2023-03-19 LAB — LIPID PANEL
Chol/HDL Ratio: 2.8 ratio (ref 0.0–4.4)
Cholesterol, Total: 182 mg/dL (ref 100–199)
HDL: 66 mg/dL (ref >39)
LDL Chol Calc (NIH): 88 mg/dL (ref 0–99)
Triglycerides: 164 mg/dL — ABNORMAL HIGH (ref 0–149)
VLDL Cholesterol Cal: 28 mg/dL (ref 5–40)

## 2023-03-19 LAB — CBC WITH DIFFERENTIAL/PLATELET
Basophils Absolute: 0 10*3/uL (ref 0.0–0.2)
Basos: 0 %
EOS (ABSOLUTE): 0 10*3/uL (ref 0.0–0.4)
Eos: 1 %
Hematocrit: 37.7 % (ref 34.0–46.6)
Hemoglobin: 12.2 g/dL (ref 11.1–15.9)
Immature Grans (Abs): 0 10*3/uL (ref 0.0–0.1)
Immature Granulocytes: 1 %
Lymphocytes Absolute: 1.1 10*3/uL (ref 0.7–3.1)
Lymphs: 32 %
MCH: 29.5 pg (ref 26.6–33.0)
MCHC: 32.4 g/dL (ref 31.5–35.7)
MCV: 91 fL (ref 79–97)
Monocytes Absolute: 0.7 10*3/uL (ref 0.1–0.9)
Monocytes: 20 %
Neutrophils Absolute: 1.6 10*3/uL (ref 1.4–7.0)
Neutrophils: 46 %
Platelets: 182 10*3/uL (ref 150–450)
RBC: 4.14 x10E6/uL (ref 3.77–5.28)
RDW: 13.9 % (ref 11.7–15.4)
WBC: 3.6 10*3/uL (ref 3.4–10.8)

## 2023-03-19 LAB — TSH+FREE T4
Free T4: 1.17 ng/dL (ref 0.82–1.77)
TSH: 2.29 u[IU]/mL (ref 0.450–4.500)

## 2023-03-19 NOTE — Progress Notes (Signed)
Hello  Ludivina,    Your lab result is normal and/or stable.Some minor variations that are not significant are commonly marked abnormal, but do not represent any medical problem for you.   Best regards,  Benny Deutschman, M.D.

## 2023-04-08 DIAGNOSIS — H903 Sensorineural hearing loss, bilateral: Secondary | ICD-10-CM | POA: Diagnosis not present

## 2023-04-08 DIAGNOSIS — H6123 Impacted cerumen, bilateral: Secondary | ICD-10-CM | POA: Diagnosis not present

## 2023-04-08 DIAGNOSIS — H838X3 Other specified diseases of inner ear, bilateral: Secondary | ICD-10-CM | POA: Diagnosis not present

## 2023-05-16 DIAGNOSIS — C44311 Basal cell carcinoma of skin of nose: Secondary | ICD-10-CM | POA: Diagnosis not present

## 2023-05-16 DIAGNOSIS — L570 Actinic keratosis: Secondary | ICD-10-CM | POA: Diagnosis not present

## 2023-05-16 DIAGNOSIS — L820 Inflamed seborrheic keratosis: Secondary | ICD-10-CM | POA: Diagnosis not present

## 2023-05-16 DIAGNOSIS — X32XXXD Exposure to sunlight, subsequent encounter: Secondary | ICD-10-CM | POA: Diagnosis not present

## 2023-05-29 DIAGNOSIS — Z08 Encounter for follow-up examination after completed treatment for malignant neoplasm: Secondary | ICD-10-CM | POA: Diagnosis not present

## 2023-05-29 DIAGNOSIS — D485 Neoplasm of uncertain behavior of skin: Secondary | ICD-10-CM | POA: Diagnosis not present

## 2023-05-29 DIAGNOSIS — C44311 Basal cell carcinoma of skin of nose: Secondary | ICD-10-CM | POA: Diagnosis not present

## 2023-05-29 DIAGNOSIS — Z85828 Personal history of other malignant neoplasm of skin: Secondary | ICD-10-CM | POA: Diagnosis not present

## 2023-05-31 ENCOUNTER — Ambulatory Visit (INDEPENDENT_AMBULATORY_CARE_PROVIDER_SITE_OTHER): Payer: Medicare Other | Admitting: Family Medicine

## 2023-05-31 ENCOUNTER — Encounter: Payer: Self-pay | Admitting: Family Medicine

## 2023-05-31 VITALS — BP 186/72 | HR 76 | Temp 97.0°F | Resp 20 | Ht 62.0 in | Wt 144.0 lb

## 2023-05-31 DIAGNOSIS — L03114 Cellulitis of left upper limb: Secondary | ICD-10-CM

## 2023-05-31 DIAGNOSIS — S40862A Insect bite (nonvenomous) of left upper arm, initial encounter: Secondary | ICD-10-CM

## 2023-05-31 DIAGNOSIS — W57XXXA Bitten or stung by nonvenomous insect and other nonvenomous arthropods, initial encounter: Secondary | ICD-10-CM

## 2023-05-31 MED ORDER — TRIAMCINOLONE ACETONIDE 0.1 % EX CREA: 1.0000 | TOPICAL_CREAM | Freq: Two times a day (BID) | CUTANEOUS | 0 refills | Status: DC

## 2023-05-31 MED ORDER — DOXYCYCLINE HYCLATE 100 MG PO TABS: 100.0000 mg | ORAL_TABLET | Freq: Two times a day (BID) | ORAL | 0 refills | Status: AC

## 2023-05-31 NOTE — Progress Notes (Signed)
Subjective:  Patient ID: Megan Chapman, female    DOB: 09/16/1931, 89 y.o.   MRN: [id number]  Patient Care Team: [name], MD as PCP - General (Family Medicine) Elesa Hacker, MD as Referring Physician (Dermatology) Duffy Bruce, MD as Referring Physician (Orthopedic Surgery) Casper Harrison, MD as Referring Physician (Plastic Surgery)   Chief Complaint:  Tick under left arm   HPI: Megan Chapman is a 89 y.o. female presenting on 05/31/2023 for Tick under left arm   Pt presents today for evaluation of a tick bite to left axilla. States she removed the tick yesterday. States the area is now red, swollen, and pruritic. Denies arthralgias, myalgias, weakness, fatigue, fever, chills, headaches, or confusion. Has not tried anything for symptom relief.      Relevant past medical, surgical, family, and social history reviewed and updated as indicated.  Allergies and medications reviewed and updated. Data reviewed: Chart in Epic.   Past Medical History:  Diagnosis Date   Allergy    Anxiety    Arthritis    Asthma    not treated   Basal cell carcinoma    Cataract    GERD (gastroesophageal reflux disease)    Not treated   HTN (hypertension)    Hyperlipidemia    Migraine    Palpitations    Thyroid disease    hypothyroid    Past Surgical History:  Procedure Laterality Date   ABDOMINAL HYSTERECTOMY     partial   APPENDECTOMY     BASAL CELL CARCINOMA EXCISION  2017   nose   CATARACT EXTRACTION Bilateral    COLONOSCOPY     MENISCUS REPAIR Right    03/11/15   PARTIAL KNEE ARTHROPLASTY Left    TONSILLECTOMY      Social History   Socioeconomic History   Marital status: Widowed    Spouse name: Not on file   Number of children: 4   Years of education: 10   Highest education level: GED or equivalent  Occupational History   Occupation: retired  Tobacco Use   Smoking status: Never   Smokeless tobacco: Never  Vaping Use   Vaping Use: Never  used  Substance and Sexual Activity   Alcohol use: No   Drug use: No   Sexual activity: Not Currently    Birth control/protection: Surgical  Other Topics Concern   Not on file  Social History Narrative   Lives alone.  Widow twice.  Worked until 82 years.     Son, Nelma Rothman is with her almost daily.   Social Determinants of Health   Financial Resource Strain: Low Risk  (08/08/2022)   Overall Financial Resource Strain (CARDIA)    Difficulty of Paying Living Expenses: Not hard at all  Food Insecurity: No Food Insecurity (08/08/2022)   Hunger Vital Sign    Worried About Running Out of Food in the Last Year: Never true    Ran Out of Food in the Last Year: Never true  Transportation Needs: No Transportation Needs (08/08/2022)   PRAPARE - Administrator, Civil Service (Medical): No    Lack of Transportation (Non-Medical): No  Physical Activity: Insufficiently Active (08/08/2022)   Exercise Vital Sign    Days of Exercise per Week: 7 days    Minutes of Exercise per Session: 20 min  Stress: No Stress Concern Present (08/08/2022)   [employer] of Occupational Health - Occupational Stress Questionnaire    Feeling of Stress : Not at all  Social Connections: Moderately Integrated (08/08/2022)   Social Connection and Isolation Panel [NHANES]    Frequency of Communication with Friends and Family: More than three times a week    Frequency of Social Gatherings with Friends and Family: More than three times a week    Attends Religious Services: More than 4 times per year    Active Member of [employer] or Organizations: Yes    Attends Banker Meetings: More than 4 times per year    Marital Status: Widowed  Intimate Partner Violence: Not At Risk (08/08/2022)   Humiliation, Afraid, Rape, and Kick questionnaire    Fear of Current or Ex-Partner: No    Emotionally Abused: No    Physically Abused: No    Sexually Abused: No    Outpatient Encounter Medications as of 05/31/2023  Medication  Sig   acetaminophen (TYLENOL) 500 MG tablet Take 1 tablet (500 mg total) by mouth every 6 (six) hours as needed.   albuterol (VENTOLIN HFA) 108 (90 Base) MCG/ACT inhaler INHALE 1-2 PUFFS BY MOUTH EVERY 6 HOURS AS NEEDED FOR WHEEZE OR SHORTNESS OF BREATH   atorvastatin (LIPITOR) 40 MG tablet Take 1 tablet (40 mg total) by mouth daily.   Cholecalciferol (VITAMIN D3) 1000 UNITS CAPS Take 2 capsules (2,000 Units total) by mouth daily.   doxycycline (VIBRA-TABS) 100 MG tablet Take 1 tablet (100 mg total) by mouth 2 (two) times daily for 10 days. 1 po bid   levothyroxine (SYNTHROID) 25 MCG tablet Take 1 tablet (25 mcg total) by mouth daily before breakfast.   Omega-3 Fatty Acids (FISH OIL) 1200 MG CAPS Take 2 capsules by mouth daily.   triamcinolone cream (KENALOG) 0.1 % Apply 1 Application topically 2 (two) times daily.   No facility-administered encounter medications on file as of 05/31/2023.    Allergies  Allergen Reactions   Amoxicillin    Biaxin [Clarithromycin]    Ciprofloxacin Nausea Only   Codeine    Nitrofurantoin Nausea And Vomiting and Nausea Only   Sulfa Antibiotics     Review of Systems  Constitutional:  Negative for activity change, appetite change, chills, diaphoresis, fatigue, fever and unexpected weight change.  HENT: Negative.    Eyes: Negative.   Respiratory:  Negative for cough, chest tightness and shortness of breath.   Cardiovascular:  Negative for chest pain, palpitations and leg swelling.  Gastrointestinal:  Negative for abdominal pain, blood in stool, constipation, diarrhea, nausea and vomiting.  Endocrine: Negative.   Genitourinary:  Negative for decreased urine volume, difficulty urinating, dysuria, frequency and urgency.  Musculoskeletal:  Negative for arthralgias and myalgias.  Skin:  Positive for color change and wound. Negative for pallor and rash.  Allergic/Immunologic: Negative.   Neurological:  Negative for dizziness, tremors, seizures, syncope, facial  asymmetry, speech difficulty, weakness, light-headedness, numbness and headaches.  Hematological: Negative.   Psychiatric/Behavioral:  Negative for confusion, hallucinations, sleep disturbance and suicidal ideas.   All other systems reviewed and are negative.       Objective:  BP (!) 186/72   Pulse 76   Temp (!) 97 F (36.1 C) (Temporal)   Resp 20   Ht 5\' 2"  (1.575 m)   Wt 144 lb (65.3 kg)   SpO2 98%   BMI 26.34 kg/m    Wt Readings from Last 3 Encounters:  05/31/23 144 lb (65.3 kg)  03/18/23 141 lb 9.6 oz (64.2 kg)  01/02/23 139 lb 2 oz (63.1 kg)    Physical Exam Vitals and nursing note reviewed.  Constitutional:      General: She is not in acute distress.    Appearance: Normal appearance. She is not ill-appearing, toxic-appearing or diaphoretic.  HENT:     Head: Normocephalic and atraumatic.     Nose:     Comments: Healing wounds from recent biopsy    Mouth/Throat:     Mouth: Mucous membranes are moist.  Eyes:     Conjunctiva/sclera: Conjunctivae normal.     Pupils: Pupils are equal, round, and reactive to light.  Cardiovascular:     Rate and Rhythm: Normal rate and regular rhythm.  Pulmonary:     Effort: Pulmonary effort is normal.     Breath sounds: Normal breath sounds.  Skin:    General: Skin is warm and dry.     Capillary Refill: Capillary refill takes less than 2 seconds.     Findings: Erythema and rash present. Rash is nodular.       Neurological:     General: No focal deficit present.     Mental Status: She is alert and oriented to person, place, and time.  Psychiatric:        Mood and Affect: Mood normal.        Behavior: Behavior normal.        Thought Content: Thought content normal.        Judgment: Judgment normal.     Results for orders placed or performed in visit on 03/18/23  CBC with Differential/Platelet  Result Value Ref Range   WBC 3.6 3.4 - 10.8 x10E3/uL   RBC 4.14 3.77 - 5.28 x10E6/uL   Hemoglobin 12.2 11.1 - 15.9 g/dL    Hematocrit 13.0 86.5 - 46.6 %   MCV 91 79 - 97 fL   MCH 29.5 26.6 - 33.0 pg   MCHC 32.4 31.5 - 35.7 g/dL   RDW 78.4 69.6 - 29.5 %   Platelets 182 150 - 450 x10E3/uL   Neutrophils 46 Not Estab. %   Lymphs 32 Not Estab. %   Monocytes 20 Not Estab. %   Eos 1 Not Estab. %   Basos 0 Not Estab. %   Neutrophils Absolute 1.6 1.4 - 7.0 x10E3/uL   Lymphocytes Absolute 1.1 0.7 - 3.1 x10E3/uL   Monocytes Absolute 0.7 0.1 - 0.9 x10E3/uL   EOS (ABSOLUTE) 0.0 0.0 - 0.4 x10E3/uL   Basophils Absolute 0.0 0.0 - 0.2 x10E3/uL   Immature Granulocytes 1 Not Estab. %   Immature Grans (Abs) 0.0 0.0 - 0.1 x10E3/uL  CMP14+EGFR  Result Value Ref Range   Glucose 85 70 - 99 mg/dL   BUN 13 8 - 27 mg/dL   Creatinine, Ser 2.84 0.57 - 1.00 mg/dL   eGFR 65 >13 KG/MWN/0.27   BUN/Creatinine Ratio 15 12 - 28   Sodium 141 134 - 144 mmol/L   Potassium 5.0 3.5 - 5.2 mmol/L   Chloride 102 96 - 106 mmol/L   CO2 24 20 - 29 mmol/L   Calcium 9.9 8.7 - 10.3 mg/dL   Total Protein 6.8 6.0 - 8.5 g/dL   Albumin 4.5 3.7 - 4.7 g/dL   Globulin, Total 2.3 1.5 - 4.5 g/dL   Albumin/Globulin Ratio 2.0 1.2 - 2.2   Bilirubin Total 0.4 0.0 - 1.2 mg/dL   Alkaline Phosphatase 72 44 - 121 IU/L   AST 14 0 - 40 IU/L   ALT 13 0 - 32 IU/L  TSH + free T4  Result Value Ref Range   TSH 2.290 0.450 - 4.500 uIU/mL  Free T4 1.17 0.82 - 1.77 ng/dL  Lipid panel  Result Value Ref Range   Cholesterol, Total 182 100 - 199 mg/dL   Triglycerides 161 (H) 0 - 149 mg/dL   HDL 66 >09 mg/dL   VLDL Cholesterol Cal 28 5 - 40 mg/dL   LDL Chol Calc (NIH) 88 0 - 99 mg/dL   Chol/HDL Ratio 2.8 0.0 - 4.4 ratio       Pertinent labs & imaging results that were available during my care of the patient were reviewed by me and considered in my medical decision making.  Assessment & Plan:  Megan Chapman was seen today for tick under left arm.  Diagnoses and all orders for this visit:  Tick bite of left upper arm, initial encounter Cellulitis of left upper  extremity Cellulitis around area, no bullseye rash. Will treat with below. Pt aware to report new, worsening, or persistent symptoms. Medications as prescribed.  -     doxycycline (VIBRA-TABS) 100 MG tablet; Take 1 tablet (100 mg total) by mouth 2 (two) times daily for 10 days. 1 po bid -     triamcinolone cream (KENALOG) 0.1 %; Apply 1 Application topically 2 (two) times daily.     Continue all other maintenance medications.  Follow up plan: Return if symptoms worsen or fail to improve.   Continue healthy lifestyle choices, including diet (rich in fruits, vegetables, and lean proteins, and low in salt and simple carbohydrates) and exercise (at least 30 minutes of moderate physical activity daily).  Educational handout given for tick bite  The above assessment and management plan was discussed with the patient. The patient verbalized understanding of and has agreed to the management plan. Patient is aware to call the clinic if they develop any new symptoms or if symptoms persist or worsen. Patient is aware when to return to the clinic for a follow-up visit. Patient educated on when it is appropriate to go to the emergency department.   Kari Baars, FNP-C [facility] [phone]

## 2023-06-05 DIAGNOSIS — D485 Neoplasm of uncertain behavior of skin: Secondary | ICD-10-CM | POA: Diagnosis not present

## 2023-06-10 DIAGNOSIS — C44321 Squamous cell carcinoma of skin of nose: Secondary | ICD-10-CM | POA: Diagnosis not present

## 2023-07-17 DIAGNOSIS — C44321 Squamous cell carcinoma of skin of nose: Secondary | ICD-10-CM | POA: Diagnosis not present

## 2023-07-19 ENCOUNTER — Telehealth: Payer: Self-pay | Admitting: Family Medicine

## 2023-07-22 DIAGNOSIS — D485 Neoplasm of uncertain behavior of skin: Secondary | ICD-10-CM | POA: Diagnosis not present

## 2023-07-23 DIAGNOSIS — Z79899 Other long term (current) drug therapy: Secondary | ICD-10-CM | POA: Diagnosis not present

## 2023-07-23 DIAGNOSIS — C44321 Squamous cell carcinoma of skin of nose: Secondary | ICD-10-CM | POA: Diagnosis not present

## 2023-07-23 DIAGNOSIS — C4492 Squamous cell carcinoma of skin, unspecified: Secondary | ICD-10-CM | POA: Diagnosis not present

## 2023-07-24 NOTE — Telephone Encounter (Signed)
R/C to Selena Batten - Made her aware that we have not referred this Patient to Oncology - She states Patient was referred to their Office by Dermatology - Made Kim aware that the Notes would need to come from Dermatology Office that referred Patient as we have not seen Patient since 05/31/2023.

## 2023-07-30 DIAGNOSIS — C44321 Squamous cell carcinoma of skin of nose: Secondary | ICD-10-CM | POA: Diagnosis not present

## 2023-07-30 DIAGNOSIS — K449 Diaphragmatic hernia without obstruction or gangrene: Secondary | ICD-10-CM | POA: Diagnosis not present

## 2023-07-30 DIAGNOSIS — C4492 Squamous cell carcinoma of skin, unspecified: Secondary | ICD-10-CM | POA: Diagnosis not present

## 2023-07-30 DIAGNOSIS — I700 Atherosclerosis of aorta: Secondary | ICD-10-CM | POA: Diagnosis not present

## 2023-08-06 DIAGNOSIS — Z5112 Encounter for antineoplastic immunotherapy: Secondary | ICD-10-CM | POA: Diagnosis not present

## 2023-08-06 DIAGNOSIS — Z79899 Other long term (current) drug therapy: Secondary | ICD-10-CM | POA: Diagnosis not present

## 2023-08-06 DIAGNOSIS — C4492 Squamous cell carcinoma of skin, unspecified: Secondary | ICD-10-CM | POA: Diagnosis not present

## 2023-08-13 ENCOUNTER — Encounter: Payer: Self-pay | Admitting: *Deleted

## 2023-08-15 ENCOUNTER — Other Ambulatory Visit: Payer: Self-pay | Admitting: Family Medicine

## 2023-08-17 ENCOUNTER — Encounter: Payer: Self-pay | Admitting: Family Medicine

## 2023-08-29 DIAGNOSIS — Z5112 Encounter for antineoplastic immunotherapy: Secondary | ICD-10-CM | POA: Diagnosis not present

## 2023-08-29 DIAGNOSIS — Z79899 Other long term (current) drug therapy: Secondary | ICD-10-CM | POA: Diagnosis not present

## 2023-08-29 DIAGNOSIS — C4492 Squamous cell carcinoma of skin, unspecified: Secondary | ICD-10-CM | POA: Diagnosis not present

## 2023-09-17 DIAGNOSIS — Z5112 Encounter for antineoplastic immunotherapy: Secondary | ICD-10-CM | POA: Diagnosis not present

## 2023-09-17 DIAGNOSIS — Z800 Family history of malignant neoplasm of digestive organs: Secondary | ICD-10-CM | POA: Diagnosis not present

## 2023-09-17 DIAGNOSIS — C4492 Squamous cell carcinoma of skin, unspecified: Secondary | ICD-10-CM | POA: Diagnosis not present

## 2023-09-17 DIAGNOSIS — E785 Hyperlipidemia, unspecified: Secondary | ICD-10-CM | POA: Diagnosis not present

## 2023-09-17 DIAGNOSIS — E039 Hypothyroidism, unspecified: Secondary | ICD-10-CM | POA: Diagnosis not present

## 2023-09-17 DIAGNOSIS — Z79890 Hormone replacement therapy (postmenopausal): Secondary | ICD-10-CM | POA: Diagnosis not present

## 2023-09-17 DIAGNOSIS — M1990 Unspecified osteoarthritis, unspecified site: Secondary | ICD-10-CM | POA: Diagnosis not present

## 2023-09-17 DIAGNOSIS — M25542 Pain in joints of left hand: Secondary | ICD-10-CM | POA: Diagnosis not present

## 2023-09-17 DIAGNOSIS — Z79899 Other long term (current) drug therapy: Secondary | ICD-10-CM | POA: Diagnosis not present

## 2023-09-17 DIAGNOSIS — M25541 Pain in joints of right hand: Secondary | ICD-10-CM | POA: Diagnosis not present

## 2023-09-17 DIAGNOSIS — M47811 Spondylosis without myelopathy or radiculopathy, occipito-atlanto-axial region: Secondary | ICD-10-CM | POA: Diagnosis not present

## 2023-09-18 ENCOUNTER — Ambulatory Visit: Payer: Medicare Other | Admitting: Family Medicine

## 2023-09-19 ENCOUNTER — Ambulatory Visit: Payer: Medicare Other | Admitting: Family Medicine

## 2023-09-23 ENCOUNTER — Ambulatory Visit: Payer: Medicare Other | Admitting: Family Medicine

## 2023-10-01 DIAGNOSIS — M2041 Other hammer toe(s) (acquired), right foot: Secondary | ICD-10-CM | POA: Diagnosis not present

## 2023-10-01 DIAGNOSIS — M19072 Primary osteoarthritis, left ankle and foot: Secondary | ICD-10-CM | POA: Diagnosis not present

## 2023-10-01 DIAGNOSIS — I739 Peripheral vascular disease, unspecified: Secondary | ICD-10-CM | POA: Diagnosis not present

## 2023-10-01 DIAGNOSIS — M2042 Other hammer toe(s) (acquired), left foot: Secondary | ICD-10-CM | POA: Diagnosis not present

## 2023-10-01 DIAGNOSIS — M19071 Primary osteoarthritis, right ankle and foot: Secondary | ICD-10-CM | POA: Diagnosis not present

## 2023-10-01 DIAGNOSIS — L84 Corns and callosities: Secondary | ICD-10-CM | POA: Diagnosis not present

## 2023-10-01 DIAGNOSIS — B351 Tinea unguium: Secondary | ICD-10-CM | POA: Diagnosis not present

## 2023-10-01 DIAGNOSIS — Z1330 Encounter for screening examination for mental health and behavioral disorders, unspecified: Secondary | ICD-10-CM | POA: Diagnosis not present

## 2023-10-18 DIAGNOSIS — H26491 Other secondary cataract, right eye: Secondary | ICD-10-CM | POA: Diagnosis not present

## 2023-10-18 DIAGNOSIS — H527 Unspecified disorder of refraction: Secondary | ICD-10-CM | POA: Diagnosis not present

## 2023-10-18 DIAGNOSIS — H35373 Puckering of macula, bilateral: Secondary | ICD-10-CM | POA: Diagnosis not present

## 2023-10-18 DIAGNOSIS — H353122 Nonexudative age-related macular degeneration, left eye, intermediate dry stage: Secondary | ICD-10-CM | POA: Diagnosis not present

## 2023-10-18 DIAGNOSIS — H43813 Vitreous degeneration, bilateral: Secondary | ICD-10-CM | POA: Diagnosis not present

## 2023-10-23 ENCOUNTER — Ambulatory Visit: Payer: Medicare Other

## 2023-10-24 ENCOUNTER — Ambulatory Visit: Payer: Medicare Other | Admitting: Family Medicine

## 2023-10-29 DIAGNOSIS — Z79890 Hormone replacement therapy (postmenopausal): Secondary | ICD-10-CM | POA: Diagnosis not present

## 2023-10-29 DIAGNOSIS — C760 Malignant neoplasm of head, face and neck: Secondary | ICD-10-CM | POA: Diagnosis not present

## 2023-10-29 DIAGNOSIS — Z7982 Long term (current) use of aspirin: Secondary | ICD-10-CM | POA: Diagnosis not present

## 2023-10-29 DIAGNOSIS — E039 Hypothyroidism, unspecified: Secondary | ICD-10-CM | POA: Diagnosis not present

## 2023-10-29 DIAGNOSIS — Z79899 Other long term (current) drug therapy: Secondary | ICD-10-CM | POA: Diagnosis not present

## 2023-10-29 DIAGNOSIS — C4492 Squamous cell carcinoma of skin, unspecified: Secondary | ICD-10-CM | POA: Diagnosis not present

## 2023-10-29 DIAGNOSIS — Z5112 Encounter for antineoplastic immunotherapy: Secondary | ICD-10-CM | POA: Diagnosis not present

## 2023-10-30 ENCOUNTER — Other Ambulatory Visit: Payer: Self-pay | Admitting: Family Medicine

## 2023-11-21 ENCOUNTER — Ambulatory Visit: Payer: Medicare Other | Admitting: Family Medicine

## 2023-11-25 ENCOUNTER — Ambulatory Visit: Payer: Medicare Other | Admitting: Family Medicine

## 2023-11-25 ENCOUNTER — Encounter: Payer: Self-pay | Admitting: Family Medicine

## 2023-11-25 VITALS — BP 147/64 | HR 69 | Temp 97.4°F | Ht 62.0 in | Wt 146.4 lb

## 2023-11-25 DIAGNOSIS — E038 Other specified hypothyroidism: Secondary | ICD-10-CM

## 2023-11-25 DIAGNOSIS — I10 Essential (primary) hypertension: Secondary | ICD-10-CM

## 2023-11-25 DIAGNOSIS — E782 Mixed hyperlipidemia: Secondary | ICD-10-CM

## 2023-11-25 DIAGNOSIS — Z23 Encounter for immunization: Secondary | ICD-10-CM | POA: Diagnosis not present

## 2023-11-25 MED ORDER — FLUTICASONE-SALMETEROL 100-50 MCG/ACT IN AEPB: 1.0000 | INHALATION_SPRAY | Freq: Two times a day (BID) | RESPIRATORY_TRACT | 11 refills | Status: AC

## 2023-11-25 NOTE — Progress Notes (Signed)
Subjective:  Patient ID: Megan Chapman, female    DOB: [birth year]  Age: 89 y.o. MRN: [id number]  CC: Medical Management of Chronic Issues   HPI Megan Chapman presents for  presents for  follow-up of hypertension. Patient has no history of headache chest pain or shortness of breath or recent cough. Patient also denies symptoms of TIA such as focal numbness or weakness. Patient denies side effects from medication. States taking it regularly.   follow-up on  thyroid. The patient has a history of hypothyroidism for many years. It has been stable recently. Pt. denies any change in  voice, loss of hair, heat or cold intolerance. Energy level has been adequate to good. Patient denies constipation and diarrhea. No myxedema. Medication is as noted below. Verified that pt is taking it daily on an empty stomach. Well tolerated.   in for follow-up of elevated cholesterol. Doing well without complaints on current medication. Denies side effects of statin including myalgia and arthralgia and nausea. Currently no chest pain, shortness of breath or other cardiovascular related symptoms noted.      11/25/2023    8:54 AM 03/18/2023   10:49 AM 01/02/2023    2:16 PM  Depression screen PHQ 2/9  Decreased Interest 0 0 0  Down, Depressed, Hopeless 0 0 0  PHQ - 2 Score 0 0 0  Altered sleeping   0  Tired, decreased energy   0  Change in appetite   0  Feeling bad or failure about yourself    0  Trouble concentrating   0  Moving slowly or fidgety/restless   0  Suicidal thoughts   0  PHQ-9 Score   0  Difficult doing work/chores   Not difficult at all    History Megan Chapman has a past medical history of Allergy, Anxiety, Arthritis, Asthma, Basal cell carcinoma, Cataract, GERD (gastroesophageal reflux disease), HTN (hypertension), Hyperlipidemia, Migraine, Palpitations, and Thyroid disease.   She has a past surgical history that includes Partial knee arthroplasty (Left); Abdominal hysterectomy; Meniscus repair (Right);  Excision basal cell carcinoma (2017); Cataract extraction (Bilateral); Appendectomy; Tonsillectomy; and Colonoscopy.   Her family history includes COPD in her son; Cancer in her brother; Cancer - Colon in her mother; Colon cancer in her mother; Down syndrome in her daughter; Migraines in her brother and son; Prostate cancer in her father; Stroke in her brother.She reports that she has never smoked. She has never used smokeless tobacco. She reports that she does not drink alcohol and does not use drugs.    ROS Review of Systems  Constitutional: Negative.   HENT: Negative.    Eyes:  Negative for visual disturbance.  Respiratory:  Negative for shortness of breath.   Cardiovascular:  Negative for chest pain.  Gastrointestinal:  Negative for abdominal pain.  Musculoskeletal:  Negative for arthralgias.    Objective:  BP (!) 147/64   Pulse 69   Temp (!) 97.4 F (36.3 C)   Ht 5\' 2"  (1.575 m)   Wt 146 lb 6.4 oz (66.4 kg)   SpO2 98%   BMI 26.78 kg/m   BP Readings from Last 3 Encounters:  11/25/23 (!) 147/64  05/31/23 (!) 186/72  03/18/23 (!) 148/74    Wt Readings from Last 3 Encounters:  11/25/23 146 lb 6.4 oz (66.4 kg)  05/31/23 144 lb (65.3 kg)  03/18/23 141 lb 9.6 oz (64.2 kg)     Physical Exam Constitutional:      General: She is not in acute distress.  Appearance: She is well-developed.  Cardiovascular:     Rate and Rhythm: Normal rate and regular rhythm.  Pulmonary:     Breath sounds: Normal breath sounds.  Musculoskeletal:        General: Normal range of motion.  Skin:    General: Skin is warm and dry.  Neurological:     Mental Status: She is alert and oriented to person, place, and time.       Assessment & Plan:   Megan Chapman was seen today for medical management of chronic issues.  Diagnoses and all orders for this visit:  Mixed hyperlipidemia -     Lipid panel  Essential hypertension -     CBC with Differential/Platelet -     CMP14+EGFR  Other  specified hypothyroidism -     TSH + free T4  Need for influenza vaccination -     Flu Vaccine Trivalent High Dose (Fluad)  Other orders -     fluticasone-salmeterol (ADVAIR DISKUS) 100-50 MCG/ACT AEPB; Inhale 1 puff into the lungs 2 (two) times daily.       I am having Megan Chapman start on fluticasone-salmeterol. I am also having her maintain her Vitamin D3, Fish Oil, acetaminophen, albuterol, triamcinolone cream, levothyroxine, and atorvastatin.  Allergies as of 11/25/2023       Reactions   Amoxicillin    Biaxin [clarithromycin]    Ciprofloxacin Nausea Only   Codeine    Nitrofurantoin Nausea And Vomiting, Nausea Only   Sulfa Antibiotics         Medication List        Accurate as of November 25, 2023  9:20 PM. If you have any questions, ask your nurse or doctor.          acetaminophen 500 MG tablet Commonly known as: TYLENOL Take 1 tablet (500 mg total) by mouth every 6 (six) hours as needed.   albuterol 108 (90 Base) MCG/ACT inhaler Commonly known as: VENTOLIN HFA INHALE 1-2 PUFFS BY MOUTH EVERY 6 HOURS AS NEEDED FOR WHEEZE OR SHORTNESS OF BREATH   atorvastatin 40 MG tablet Commonly known as: LIPITOR TAKE 1 TABLET DAILY   Fish Oil 1200 MG Caps Take 2 capsules by mouth daily.   fluticasone-salmeterol 100-50 MCG/ACT Aepb Commonly known as: Advair Diskus Inhale 1 puff into the lungs 2 (two) times daily. Started by: Broadus John Dilan Fullenwider   levothyroxine 25 MCG tablet Commonly known as: SYNTHROID TAKE 1 TABLET DAILY BEFORE BREAKFAST   triamcinolone cream 0.1 % Commonly known as: KENALOG Apply 1 Application topically 2 (two) times daily.   Vitamin D3 25 MCG (1000 UT) Caps Take 2 capsules (2,000 Units total) by mouth daily.         Follow-up: Return in about 6 months (around 05/24/2024).  [name], M.D.

## 2023-11-26 LAB — CBC WITH DIFFERENTIAL/PLATELET
Basophils Absolute: 0 10*3/uL (ref 0.0–0.2)
Basos: 0 %
EOS (ABSOLUTE): 0.1 10*3/uL (ref 0.0–0.4)
Eos: 1 %
Hematocrit: 36.7 % (ref 34.0–46.6)
Hemoglobin: 11.8 g/dL (ref 11.1–15.9)
Immature Grans (Abs): 0 10*3/uL (ref 0.0–0.1)
Immature Granulocytes: 0 %
Lymphocytes Absolute: 1.3 10*3/uL (ref 0.7–3.1)
Lymphs: 34 %
MCH: 28.9 pg (ref 26.6–33.0)
MCHC: 32.2 g/dL (ref 31.5–35.7)
MCV: 90 fL (ref 79–97)
Monocytes Absolute: 0.7 10*3/uL (ref 0.1–0.9)
Monocytes: 19 %
Neutrophils Absolute: 1.7 10*3/uL (ref 1.4–7.0)
Neutrophils: 46 %
Platelets: 191 10*3/uL (ref 150–450)
RBC: 4.08 x10E6/uL (ref 3.77–5.28)
RDW: 13.4 % (ref 11.7–15.4)
WBC: 3.8 10*3/uL (ref 3.4–10.8)

## 2023-11-26 LAB — LIPID PANEL
Chol/HDL Ratio: 3.8 {ratio} (ref 0.0–4.4)
Cholesterol, Total: 177 mg/dL (ref 100–199)
HDL: 47 mg/dL (ref >39)
LDL Chol Calc (NIH): 85 mg/dL (ref 0–99)
Triglycerides: 273 mg/dL — ABNORMAL HIGH (ref 0–149)
VLDL Cholesterol Cal: 45 mg/dL — ABNORMAL HIGH (ref 5–40)

## 2023-11-26 LAB — CMP14+EGFR
ALT: 12 [IU]/L (ref 0–32)
AST: 14 [IU]/L (ref 0–40)
Albumin: 4.2 g/dL (ref 3.6–4.6)
Alkaline Phosphatase: 77 [IU]/L (ref 44–121)
BUN/Creatinine Ratio: 19 (ref 12–28)
BUN: 17 mg/dL (ref 10–36)
Bilirubin Total: 0.5 mg/dL (ref 0.0–1.2)
CO2: 20 mmol/L (ref 20–29)
Calcium: 9.8 mg/dL (ref 8.7–10.3)
Chloride: 103 mmol/L (ref 96–106)
Creatinine, Ser: 0.88 mg/dL (ref 0.57–1.00)
Globulin, Total: 2.8 g/dL (ref 1.5–4.5)
Glucose: 87 mg/dL (ref 70–99)
Potassium: 5 mmol/L (ref 3.5–5.2)
Sodium: 142 mmol/L (ref 134–144)
Total Protein: 7 g/dL (ref 6.0–8.5)
eGFR: 62 mL/min/{1.73_m2} (ref >59)

## 2023-11-26 LAB — TSH+FREE T4
Free T4: 1.12 ng/dL (ref 0.82–1.77)
TSH: 3.09 u[IU]/mL (ref 0.450–4.500)

## 2023-12-05 DIAGNOSIS — M533 Sacrococcygeal disorders, not elsewhere classified: Secondary | ICD-10-CM | POA: Diagnosis not present

## 2023-12-05 DIAGNOSIS — G8929 Other chronic pain: Secondary | ICD-10-CM | POA: Diagnosis not present

## 2023-12-05 DIAGNOSIS — M25531 Pain in right wrist: Secondary | ICD-10-CM | POA: Diagnosis not present

## 2023-12-05 DIAGNOSIS — M545 Low back pain, unspecified: Secondary | ICD-10-CM | POA: Diagnosis not present

## 2023-12-12 DIAGNOSIS — Z08 Encounter for follow-up examination after completed treatment for malignant neoplasm: Secondary | ICD-10-CM | POA: Diagnosis not present

## 2023-12-12 DIAGNOSIS — D225 Melanocytic nevi of trunk: Secondary | ICD-10-CM | POA: Diagnosis not present

## 2023-12-12 DIAGNOSIS — Z85828 Personal history of other malignant neoplasm of skin: Secondary | ICD-10-CM | POA: Diagnosis not present

## 2023-12-12 DIAGNOSIS — L820 Inflamed seborrheic keratosis: Secondary | ICD-10-CM | POA: Diagnosis not present

## 2023-12-19 DIAGNOSIS — M7918 Myalgia, other site: Secondary | ICD-10-CM | POA: Diagnosis not present

## 2023-12-19 DIAGNOSIS — M533 Sacrococcygeal disorders, not elsewhere classified: Secondary | ICD-10-CM | POA: Diagnosis not present

## 2024-01-08 DIAGNOSIS — B351 Tinea unguium: Secondary | ICD-10-CM | POA: Diagnosis not present

## 2024-01-08 DIAGNOSIS — L84 Corns and callosities: Secondary | ICD-10-CM | POA: Diagnosis not present

## 2024-01-08 DIAGNOSIS — I739 Peripheral vascular disease, unspecified: Secondary | ICD-10-CM | POA: Diagnosis not present

## 2024-01-22 DIAGNOSIS — M9903 Segmental and somatic dysfunction of lumbar region: Secondary | ICD-10-CM | POA: Diagnosis not present

## 2024-01-22 DIAGNOSIS — M47816 Spondylosis without myelopathy or radiculopathy, lumbar region: Secondary | ICD-10-CM | POA: Diagnosis not present

## 2024-01-22 DIAGNOSIS — M5126 Other intervertebral disc displacement, lumbar region: Secondary | ICD-10-CM | POA: Diagnosis not present

## 2024-01-23 DIAGNOSIS — M47816 Spondylosis without myelopathy or radiculopathy, lumbar region: Secondary | ICD-10-CM | POA: Diagnosis not present

## 2024-01-23 DIAGNOSIS — M5126 Other intervertebral disc displacement, lumbar region: Secondary | ICD-10-CM | POA: Diagnosis not present

## 2024-01-23 DIAGNOSIS — M9903 Segmental and somatic dysfunction of lumbar region: Secondary | ICD-10-CM | POA: Diagnosis not present

## 2024-01-27 DIAGNOSIS — M9903 Segmental and somatic dysfunction of lumbar region: Secondary | ICD-10-CM | POA: Diagnosis not present

## 2024-01-27 DIAGNOSIS — M5126 Other intervertebral disc displacement, lumbar region: Secondary | ICD-10-CM | POA: Diagnosis not present

## 2024-01-27 DIAGNOSIS — M47816 Spondylosis without myelopathy or radiculopathy, lumbar region: Secondary | ICD-10-CM | POA: Diagnosis not present

## 2024-01-28 DIAGNOSIS — C4492 Squamous cell carcinoma of skin, unspecified: Secondary | ICD-10-CM | POA: Diagnosis not present

## 2024-01-28 DIAGNOSIS — M1990 Unspecified osteoarthritis, unspecified site: Secondary | ICD-10-CM | POA: Diagnosis not present

## 2024-01-29 DIAGNOSIS — M9903 Segmental and somatic dysfunction of lumbar region: Secondary | ICD-10-CM | POA: Diagnosis not present

## 2024-01-29 DIAGNOSIS — M5126 Other intervertebral disc displacement, lumbar region: Secondary | ICD-10-CM | POA: Diagnosis not present

## 2024-01-29 DIAGNOSIS — M47816 Spondylosis without myelopathy or radiculopathy, lumbar region: Secondary | ICD-10-CM | POA: Diagnosis not present

## 2024-02-03 DIAGNOSIS — M47816 Spondylosis without myelopathy or radiculopathy, lumbar region: Secondary | ICD-10-CM | POA: Diagnosis not present

## 2024-02-03 DIAGNOSIS — M9903 Segmental and somatic dysfunction of lumbar region: Secondary | ICD-10-CM | POA: Diagnosis not present

## 2024-02-03 DIAGNOSIS — M5126 Other intervertebral disc displacement, lumbar region: Secondary | ICD-10-CM | POA: Diagnosis not present

## 2024-02-06 DIAGNOSIS — M5126 Other intervertebral disc displacement, lumbar region: Secondary | ICD-10-CM | POA: Diagnosis not present

## 2024-02-06 DIAGNOSIS — M47816 Spondylosis without myelopathy or radiculopathy, lumbar region: Secondary | ICD-10-CM | POA: Diagnosis not present

## 2024-02-06 DIAGNOSIS — M9903 Segmental and somatic dysfunction of lumbar region: Secondary | ICD-10-CM | POA: Diagnosis not present

## 2024-02-10 DIAGNOSIS — M5126 Other intervertebral disc displacement, lumbar region: Secondary | ICD-10-CM | POA: Diagnosis not present

## 2024-02-10 DIAGNOSIS — M9903 Segmental and somatic dysfunction of lumbar region: Secondary | ICD-10-CM | POA: Diagnosis not present

## 2024-02-10 DIAGNOSIS — M47816 Spondylosis without myelopathy or radiculopathy, lumbar region: Secondary | ICD-10-CM | POA: Diagnosis not present

## 2024-02-17 DIAGNOSIS — M9903 Segmental and somatic dysfunction of lumbar region: Secondary | ICD-10-CM | POA: Diagnosis not present

## 2024-02-17 DIAGNOSIS — M5126 Other intervertebral disc displacement, lumbar region: Secondary | ICD-10-CM | POA: Diagnosis not present

## 2024-02-17 DIAGNOSIS — M47816 Spondylosis without myelopathy or radiculopathy, lumbar region: Secondary | ICD-10-CM | POA: Diagnosis not present

## 2024-02-24 DIAGNOSIS — M5126 Other intervertebral disc displacement, lumbar region: Secondary | ICD-10-CM | POA: Diagnosis not present

## 2024-02-24 DIAGNOSIS — M9903 Segmental and somatic dysfunction of lumbar region: Secondary | ICD-10-CM | POA: Diagnosis not present

## 2024-02-24 DIAGNOSIS — M47816 Spondylosis without myelopathy or radiculopathy, lumbar region: Secondary | ICD-10-CM | POA: Diagnosis not present

## 2024-03-10 DIAGNOSIS — M9903 Segmental and somatic dysfunction of lumbar region: Secondary | ICD-10-CM | POA: Diagnosis not present

## 2024-03-10 DIAGNOSIS — M47816 Spondylosis without myelopathy or radiculopathy, lumbar region: Secondary | ICD-10-CM | POA: Diagnosis not present

## 2024-03-10 DIAGNOSIS — M5126 Other intervertebral disc displacement, lumbar region: Secondary | ICD-10-CM | POA: Diagnosis not present

## 2024-03-24 DIAGNOSIS — M47816 Spondylosis without myelopathy or radiculopathy, lumbar region: Secondary | ICD-10-CM | POA: Diagnosis not present

## 2024-03-24 DIAGNOSIS — M5126 Other intervertebral disc displacement, lumbar region: Secondary | ICD-10-CM | POA: Diagnosis not present

## 2024-03-24 DIAGNOSIS — M9903 Segmental and somatic dysfunction of lumbar region: Secondary | ICD-10-CM | POA: Diagnosis not present

## 2024-04-07 DIAGNOSIS — M5126 Other intervertebral disc displacement, lumbar region: Secondary | ICD-10-CM | POA: Diagnosis not present

## 2024-04-07 DIAGNOSIS — M9903 Segmental and somatic dysfunction of lumbar region: Secondary | ICD-10-CM | POA: Diagnosis not present

## 2024-04-07 DIAGNOSIS — M47816 Spondylosis without myelopathy or radiculopathy, lumbar region: Secondary | ICD-10-CM | POA: Diagnosis not present

## 2024-04-14 DIAGNOSIS — B351 Tinea unguium: Secondary | ICD-10-CM | POA: Diagnosis not present

## 2024-04-14 DIAGNOSIS — M2041 Other hammer toe(s) (acquired), right foot: Secondary | ICD-10-CM | POA: Diagnosis not present

## 2024-04-14 DIAGNOSIS — M2042 Other hammer toe(s) (acquired), left foot: Secondary | ICD-10-CM | POA: Diagnosis not present

## 2024-04-14 DIAGNOSIS — I739 Peripheral vascular disease, unspecified: Secondary | ICD-10-CM | POA: Diagnosis not present

## 2024-04-15 DIAGNOSIS — M9903 Segmental and somatic dysfunction of lumbar region: Secondary | ICD-10-CM | POA: Diagnosis not present

## 2024-04-15 DIAGNOSIS — M5126 Other intervertebral disc displacement, lumbar region: Secondary | ICD-10-CM | POA: Diagnosis not present

## 2024-04-15 DIAGNOSIS — M47816 Spondylosis without myelopathy or radiculopathy, lumbar region: Secondary | ICD-10-CM | POA: Diagnosis not present

## 2024-04-21 DIAGNOSIS — M47816 Spondylosis without myelopathy or radiculopathy, lumbar region: Secondary | ICD-10-CM | POA: Diagnosis not present

## 2024-04-21 DIAGNOSIS — M9903 Segmental and somatic dysfunction of lumbar region: Secondary | ICD-10-CM | POA: Diagnosis not present

## 2024-04-21 DIAGNOSIS — M5126 Other intervertebral disc displacement, lumbar region: Secondary | ICD-10-CM | POA: Diagnosis not present

## 2024-04-23 ENCOUNTER — Ambulatory Visit (INDEPENDENT_AMBULATORY_CARE_PROVIDER_SITE_OTHER): Admitting: Family Medicine

## 2024-04-23 VITALS — HR 72 | Temp 98.2°F | Ht 62.0 in | Wt 148.4 lb

## 2024-04-23 DIAGNOSIS — I10 Essential (primary) hypertension: Secondary | ICD-10-CM

## 2024-04-23 DIAGNOSIS — E782 Mixed hyperlipidemia: Secondary | ICD-10-CM

## 2024-04-23 DIAGNOSIS — M150 Primary generalized (osteo)arthritis: Secondary | ICD-10-CM | POA: Diagnosis not present

## 2024-04-23 DIAGNOSIS — E038 Other specified hypothyroidism: Secondary | ICD-10-CM

## 2024-04-23 MED ORDER — LEVOTHYROXINE SODIUM 25 MCG PO TABS: 25.0000 ug | ORAL_TABLET | Freq: Every day | ORAL | 2 refills | Status: DC

## 2024-04-23 NOTE — Progress Notes (Signed)
 Subjective:  Patient ID: Megan Chapman, female    DOB: [birth year]  Age: 89 y.o. MRN: [id number]  CC: Medical Management of Chronic Issues   HPI Megan Chapman presents for  follow-up of hypertension. Patient has no history of headache chest pain or shortness of breath or recent cough. Patient also denies symptoms of TIA such as focal numbness or weakness. Patient denies side effects from medication. States taking it regularly.   follow-up on  thyroid . The patient has a history of hypothyroidism for many years. It has been stable recently. Pt. denies any change in  voice, loss of hair, heat or cold intolerance. Energy level has been adequate to good. Patient denies constipation and diarrhea. No myxedema. Medication is as noted below. Verified that pt is taking it daily on an empty stomach. Well tolerated.  Lots of arthritis pain in the hands and shoulders. Chiropractor helping with the hips.  Taking celebrex 100 mg daily. Helps a little. Started 6 weeks ago.    History Megan Chapman has a past medical history of Allergy, Anxiety, Arthritis, Asthma, Basal cell carcinoma, Cataract, GERD (gastroesophageal reflux disease), HTN (hypertension), Hyperlipidemia, Migraine, Palpitations, and Thyroid  disease.   She has a past surgical history that includes Partial knee arthroplasty (Left); Abdominal hysterectomy; Meniscus repair (Right); Excision basal cell carcinoma (2017); Cataract extraction (Bilateral); Appendectomy; Tonsillectomy; and Colonoscopy.   Her family history includes COPD in her son; Cancer in her brother; Cancer - Colon in her mother; Colon cancer in her mother; Down syndrome in her daughter; Migraines in her brother and son; Prostate cancer in her father; Stroke in her brother.She reports that she has never smoked. She has never used smokeless tobacco. She reports that she does not drink alcohol and does not use drugs.  Current Outpatient Medications on File Prior to Visit  Medication Sig Dispense  Refill   acetaminophen  (TYLENOL ) 500 MG tablet Take 1 tablet (500 mg total) by mouth every 6 (six) hours as needed. 30 tablet 0   albuterol  (VENTOLIN  HFA) 108 (90 Base) MCG/ACT inhaler INHALE 1-2 PUFFS BY MOUTH EVERY 6 HOURS AS NEEDED FOR WHEEZE OR SHORTNESS OF BREATH 18 each 2   atorvastatin  (LIPITOR) 40 MG tablet TAKE 1 TABLET DAILY 90 tablet 3   celecoxib (CELEBREX) 100 MG capsule Take 100 mg by mouth daily.     Cholecalciferol (VITAMIN D3) 1000 UNITS CAPS Take 2 capsules (2,000 Units total) by mouth daily. 30 capsule 11   fluticasone -salmeterol (ADVAIR DISKUS) 100-50 MCG/ACT AEPB Inhale 1 puff into the lungs 2 (two) times daily. 11 each 11   Omega-3 Fatty Acids (FISH OIL) 1200 MG CAPS Take 2 capsules by mouth daily.     No current facility-administered medications on file prior to visit.    ROS Review of Systems  Constitutional: Negative.   HENT: Negative.    Eyes:  Negative for visual disturbance.  Respiratory:  Negative for shortness of breath.   Cardiovascular:  Negative for chest pain.  Gastrointestinal:  Negative for abdominal pain.  Musculoskeletal:  Positive for arthralgias.    Objective:  Pulse 72   Temp 98.2 F (36.8 C) (Temporal)   Ht 5\' 2"  (1.575 m)   Wt 148 lb 6.4 oz (67.3 kg)   SpO2 99%   BMI 27.14 kg/m   BP Readings from Last 3 Encounters:  11/25/23 (!) 147/64  05/31/23 (!) 186/72  03/18/23 (!) 148/74    Wt Readings from Last 3 Encounters:  04/23/24 148 lb 6.4 oz (67.3 kg)  11/25/23 146  lb 6.4 oz (66.4 kg)  05/31/23 144 lb (65.3 kg)     Physical Exam Constitutional:      General: She is not in acute distress.    Appearance: She is well-developed.  Cardiovascular:     Rate and Rhythm: Normal rate and regular rhythm.  Pulmonary:     Breath sounds: Normal breath sounds.  Musculoskeletal:        General: Normal range of motion.  Skin:    General: Skin is warm and dry.  Neurological:     Mental Status: She is alert and oriented to person, place,  and time.       Assessment & Plan:  Other specified hypothyroidism -     CBC with Differential/Platelet -     CMP14+EGFR -     TSH + free T4  Mixed hyperlipidemia -     CBC with Differential/Platelet -     CMP14+EGFR  Essential hypertension -     CBC with Differential/Platelet -     CMP14+EGFR  Primary osteoarthritis involving multiple joints  Other orders -     Levothyroxine  Sodium; Take 1 tablet (25 mcg total) by mouth daily before breakfast.  Dispense: 90 tablet; Refill: 2    Allergies as of 04/23/2024       Reactions   Amoxicillin    Biaxin [clarithromycin]    Ciprofloxacin  Nausea Only   Codeine    Nitrofurantoin Nausea And Vomiting, Nausea Only   Sulfa Antibiotics         Medication List        Accurate as of April 23, 2024  2:26 PM. If you have any questions, ask your nurse or doctor.          STOP taking these medications    triamcinolone  cream 0.1 % Commonly known as: KENALOG  Stopped by: Davie Claud       TAKE these medications    acetaminophen  500 MG tablet Commonly known as: TYLENOL  Take 1 tablet (500 mg total) by mouth every 6 (six) hours as needed.   albuterol  108 (90 Base) MCG/ACT inhaler Commonly known as: VENTOLIN  HFA INHALE 1-2 PUFFS BY MOUTH EVERY 6 HOURS AS NEEDED FOR WHEEZE OR SHORTNESS OF BREATH   atorvastatin  40 MG tablet Commonly known as: LIPITOR TAKE 1 TABLET DAILY   celecoxib 100 MG capsule Commonly known as: CELEBREX Take 100 mg by mouth daily.   Fish Oil 1200 MG Caps Take 2 capsules by mouth daily.   fluticasone -salmeterol 100-50 MCG/ACT Aepb Commonly known as: Advair Diskus Inhale 1 puff into the lungs 2 (two) times daily.   levothyroxine  25 MCG tablet Commonly known as: SYNTHROID  Take 1 tablet (25 mcg total) by mouth daily before breakfast.   Vitamin D3 25 MCG (1000 UT) Caps Take 2 capsules (2,000 Units total) by mouth daily.         Follow-up: No follow-ups on file.  Megan Chapman, M.D.

## 2024-04-24 LAB — CBC WITH DIFFERENTIAL/PLATELET
Basophils Absolute: 0 10*3/uL (ref 0.0–0.2)
Basos: 1 %
EOS (ABSOLUTE): 0.1 10*3/uL (ref 0.0–0.4)
Eos: 2 %
Hematocrit: 35.7 % (ref 34.0–46.6)
Hemoglobin: 12.1 g/dL (ref 11.1–15.9)
Immature Grans (Abs): 0 10*3/uL (ref 0.0–0.1)
Immature Granulocytes: 1 %
Lymphocytes Absolute: 1.8 10*3/uL (ref 0.7–3.1)
Lymphs: 39 %
MCH: 30.3 pg (ref 26.6–33.0)
MCHC: 33.9 g/dL (ref 31.5–35.7)
MCV: 90 fL (ref 79–97)
Monocytes Absolute: 0.7 10*3/uL (ref 0.1–0.9)
Monocytes: 17 %
Neutrophils Absolute: 1.8 10*3/uL (ref 1.4–7.0)
Neutrophils: 40 %
Platelets: 190 10*3/uL (ref 150–450)
RBC: 3.99 x10E6/uL (ref 3.77–5.28)
RDW: 12.5 % (ref 11.7–15.4)
WBC: 4.4 10*3/uL (ref 3.4–10.8)

## 2024-04-24 LAB — CMP14+EGFR
ALT: 12 IU/L (ref 0–32)
AST: 18 IU/L (ref 0–40)
Albumin: 4.5 g/dL (ref 3.6–4.6)
Alkaline Phosphatase: 69 IU/L (ref 44–121)
BUN/Creatinine Ratio: 18 (ref 12–28)
BUN: 19 mg/dL (ref 10–36)
Bilirubin Total: 0.5 mg/dL (ref 0.0–1.2)
CO2: 23 mmol/L (ref 20–29)
Calcium: 9.9 mg/dL (ref 8.7–10.3)
Chloride: 100 mmol/L (ref 96–106)
Creatinine, Ser: 1.07 mg/dL — ABNORMAL HIGH (ref 0.57–1.00)
Globulin, Total: 2.7 g/dL (ref 1.5–4.5)
Glucose: 80 mg/dL (ref 70–99)
Potassium: 4.8 mmol/L (ref 3.5–5.2)
Sodium: 138 mmol/L (ref 134–144)
Total Protein: 7.2 g/dL (ref 6.0–8.5)
eGFR: 49 mL/min/{1.73_m2} — ABNORMAL LOW (ref >59)

## 2024-04-24 LAB — TSH+FREE T4
Free T4: 1.04 ng/dL (ref 0.82–1.77)
TSH: 3.05 u[IU]/mL (ref 0.450–4.500)

## 2024-04-26 ENCOUNTER — Encounter: Payer: Self-pay | Admitting: Family Medicine

## 2024-04-26 NOTE — Progress Notes (Signed)
Hello  Megan Chapman,    Your lab result is normal and/or stable.Some minor variations that are not significant are commonly marked abnormal, but do not represent any medical problem for you.   Best regards,  Benny Deutschman, M.D.

## 2024-04-29 DIAGNOSIS — M47816 Spondylosis without myelopathy or radiculopathy, lumbar region: Secondary | ICD-10-CM | POA: Diagnosis not present

## 2024-04-29 DIAGNOSIS — M5126 Other intervertebral disc displacement, lumbar region: Secondary | ICD-10-CM | POA: Diagnosis not present

## 2024-04-29 DIAGNOSIS — M9903 Segmental and somatic dysfunction of lumbar region: Secondary | ICD-10-CM | POA: Diagnosis not present

## 2024-04-30 ENCOUNTER — Encounter: Payer: Self-pay | Admitting: *Deleted

## 2024-04-30 ENCOUNTER — Encounter: Payer: Self-pay | Admitting: Family Medicine

## 2024-04-30 ENCOUNTER — Ambulatory Visit (INDEPENDENT_AMBULATORY_CARE_PROVIDER_SITE_OTHER): Payer: TRICARE For Life (TFL) | Admitting: Otolaryngology

## 2024-04-30 ENCOUNTER — Ambulatory Visit (INDEPENDENT_AMBULATORY_CARE_PROVIDER_SITE_OTHER): Payer: Self-pay | Admitting: Audiology

## 2024-04-30 VITALS — HR 78 | Ht 62.0 in | Wt 148.0 lb

## 2024-04-30 DIAGNOSIS — H903 Sensorineural hearing loss, bilateral: Secondary | ICD-10-CM

## 2024-04-30 DIAGNOSIS — H6123 Impacted cerumen, bilateral: Secondary | ICD-10-CM

## 2024-04-30 NOTE — Progress Notes (Signed)
  [address], [address] [place], [place] [address] [phone]  Hearing Aid Check     Megan Chapman comes as a walk-in for a hearing aid check while she had a scheduled appointment with Dr. Darlin Ehrlich.   Accompanied: yes   Right Left  Hearing aid manufacturer Davey Erp [id number] [place] [id number]  Hearing aid style Receiver in the canal Receiver in the canal  Hearing aid battery rechargeable rechargeable  Receiver    Dome/ custom earpiece    Retention wire yes yes  Warranty expiration date 04-05-2022 04-05-2022  Initial fitting date 01-20-2020 01-20-2020    Chief complaint: Patient reports needs hearing aids to be cleaned.  Actions taken: Cleaned microphones , replaced retention wire, wax filter and domes. Inspection of the device and listening check showed that they worked well.  Services fee: $30 was paid at checkout.  Patient was oriented about the volume function on the aids..  Recommend: Return for a hearing aid check , as needed. Return for a hearing evaluation and to see an ENT, if concerns with hearing changes arise.    Shigeru Lampert MARIE LEROUX-MARTINEZ, AUD

## 2024-05-02 DIAGNOSIS — H903 Sensorineural hearing loss, bilateral: Secondary | ICD-10-CM | POA: Insufficient documentation

## 2024-05-02 DIAGNOSIS — H6123 Impacted cerumen, bilateral: Secondary | ICD-10-CM | POA: Insufficient documentation

## 2024-05-02 NOTE — Progress Notes (Signed)
 Patient ID: Megan Chapman, female   DOB: Sep 30, 1931, 89 y.o.   MRN: [id number]  Follow-up: Hearing loss  HPI: The patient is a 89 year old female who returns today for follow-up evaluation.  The patient was previously seen for bilateral high-frequency sensorineural hearing loss, secondary to presbycusis.  She was fitted with bilateral hearing aids.  The patient returns today reporting no significant change in her hearing.  However, she is having difficulty using her hearing aids.  She denies any otalgia, otorrhea, or vertigo.  Exam: General: Communicates without difficulty, well nourished, no acute distress. Head: Normocephalic, no evidence injury, no tenderness, facial buttresses intact without stepoff. Face/sinus: No tenderness to palpation and percussion. Facial movement is normal and symmetric. Eyes: PERRL, EOMI. No scleral icterus, conjunctivae clear. Neuro: CN II exam reveals vision grossly intact.  No nystagmus at any point of gaze. Ears: Auricles well formed without lesions.  Bilateral cerumen impaction.  Nose: External evaluation reveals normal support and skin without lesions.  Dorsum is intact.  Anterior rhinoscopy reveals congested mucosa over anterior aspect of inferior turbinates and intact septum.  No purulence noted. Oral:  Oral cavity and oropharynx are intact, symmetric, without erythema or edema.  Mucosa is moist without lesions. Neck: Full range of motion without pain.  There is no significant lymphadenopathy.  No masses palpable.  Thyroid  bed within normal limits to palpation.  Parotid glands and submandibular glands equal bilaterally without mass.  Trachea is midline. Neuro:  CN 2-12 grossly intact.   Procedure: Bilateral cerumen disimpaction Anesthesia: None Description: Under the operating microscope, the cerumen is carefully removed with a combination of cerumen currette, alligator forceps, and suction catheters.  After the cerumen is removed, the TMs are noted to be normal.  No  mass, erythema, or lesions. The patient tolerated the procedure well.    Assessment: 1.  Bilateral cerumen impaction.  After the disimpaction procedure, both tympanic membranes and middle ear spaces are noted to be normal. 2.  Subjectively stable bilateral high-frequency sensorineural hearing loss.  Plan: 1.  Otomicroscopy with bilateral cerumen disimpaction. 2.  The physical exam findings are reviewed with the patient. 3.  Her hearing aids will likely need to be adjusted by an audiologist.  An audiology appointment will be arranged soon as possible. 4.  The patient will return for reevaluation in 1 year, sooner if needed.

## 2024-05-13 DIAGNOSIS — M47816 Spondylosis without myelopathy or radiculopathy, lumbar region: Secondary | ICD-10-CM | POA: Diagnosis not present

## 2024-05-13 DIAGNOSIS — M9903 Segmental and somatic dysfunction of lumbar region: Secondary | ICD-10-CM | POA: Diagnosis not present

## 2024-05-13 DIAGNOSIS — M5126 Other intervertebral disc displacement, lumbar region: Secondary | ICD-10-CM | POA: Diagnosis not present

## 2024-05-26 ENCOUNTER — Ambulatory Visit: Payer: Medicare Other | Admitting: Family Medicine

## 2024-05-27 DIAGNOSIS — M5126 Other intervertebral disc displacement, lumbar region: Secondary | ICD-10-CM | POA: Diagnosis not present

## 2024-05-27 DIAGNOSIS — M9903 Segmental and somatic dysfunction of lumbar region: Secondary | ICD-10-CM | POA: Diagnosis not present

## 2024-05-27 DIAGNOSIS — M47816 Spondylosis without myelopathy or radiculopathy, lumbar region: Secondary | ICD-10-CM | POA: Diagnosis not present

## 2024-06-08 ENCOUNTER — Encounter: Payer: Self-pay | Admitting: Family Medicine

## 2024-06-11 DIAGNOSIS — L570 Actinic keratosis: Secondary | ICD-10-CM | POA: Diagnosis not present

## 2024-06-11 DIAGNOSIS — X32XXXD Exposure to sunlight, subsequent encounter: Secondary | ICD-10-CM | POA: Diagnosis not present

## 2024-06-11 DIAGNOSIS — Z85828 Personal history of other malignant neoplasm of skin: Secondary | ICD-10-CM | POA: Diagnosis not present

## 2024-06-11 DIAGNOSIS — Z08 Encounter for follow-up examination after completed treatment for malignant neoplasm: Secondary | ICD-10-CM | POA: Diagnosis not present

## 2024-06-17 DIAGNOSIS — M47816 Spondylosis without myelopathy or radiculopathy, lumbar region: Secondary | ICD-10-CM | POA: Diagnosis not present

## 2024-06-17 DIAGNOSIS — M5126 Other intervertebral disc displacement, lumbar region: Secondary | ICD-10-CM | POA: Diagnosis not present

## 2024-06-17 DIAGNOSIS — M9903 Segmental and somatic dysfunction of lumbar region: Secondary | ICD-10-CM | POA: Diagnosis not present

## 2024-07-09 DIAGNOSIS — M5126 Other intervertebral disc displacement, lumbar region: Secondary | ICD-10-CM | POA: Diagnosis not present

## 2024-07-09 DIAGNOSIS — M9903 Segmental and somatic dysfunction of lumbar region: Secondary | ICD-10-CM | POA: Diagnosis not present

## 2024-07-09 DIAGNOSIS — M47816 Spondylosis without myelopathy or radiculopathy, lumbar region: Secondary | ICD-10-CM | POA: Diagnosis not present

## 2024-07-16 DIAGNOSIS — I739 Peripheral vascular disease, unspecified: Secondary | ICD-10-CM | POA: Diagnosis not present

## 2024-07-16 DIAGNOSIS — L84 Corns and callosities: Secondary | ICD-10-CM | POA: Diagnosis not present

## 2024-07-16 DIAGNOSIS — B351 Tinea unguium: Secondary | ICD-10-CM | POA: Diagnosis not present

## 2024-07-29 DIAGNOSIS — M9903 Segmental and somatic dysfunction of lumbar region: Secondary | ICD-10-CM | POA: Diagnosis not present

## 2024-07-29 DIAGNOSIS — M47816 Spondylosis without myelopathy or radiculopathy, lumbar region: Secondary | ICD-10-CM | POA: Diagnosis not present

## 2024-07-29 DIAGNOSIS — M5126 Other intervertebral disc displacement, lumbar region: Secondary | ICD-10-CM | POA: Diagnosis not present

## 2024-08-19 DIAGNOSIS — M9903 Segmental and somatic dysfunction of lumbar region: Secondary | ICD-10-CM | POA: Diagnosis not present

## 2024-08-19 DIAGNOSIS — M5126 Other intervertebral disc displacement, lumbar region: Secondary | ICD-10-CM | POA: Diagnosis not present

## 2024-08-19 DIAGNOSIS — M47816 Spondylosis without myelopathy or radiculopathy, lumbar region: Secondary | ICD-10-CM | POA: Diagnosis not present

## 2024-09-09 DIAGNOSIS — M47816 Spondylosis without myelopathy or radiculopathy, lumbar region: Secondary | ICD-10-CM | POA: Diagnosis not present

## 2024-09-09 DIAGNOSIS — M9903 Segmental and somatic dysfunction of lumbar region: Secondary | ICD-10-CM | POA: Diagnosis not present

## 2024-09-09 DIAGNOSIS — M5126 Other intervertebral disc displacement, lumbar region: Secondary | ICD-10-CM | POA: Diagnosis not present

## 2024-09-14 DIAGNOSIS — M9903 Segmental and somatic dysfunction of lumbar region: Secondary | ICD-10-CM | POA: Diagnosis not present

## 2024-09-14 DIAGNOSIS — M5126 Other intervertebral disc displacement, lumbar region: Secondary | ICD-10-CM | POA: Diagnosis not present

## 2024-09-14 DIAGNOSIS — M47816 Spondylosis without myelopathy or radiculopathy, lumbar region: Secondary | ICD-10-CM | POA: Diagnosis not present

## 2024-09-15 DIAGNOSIS — M5126 Other intervertebral disc displacement, lumbar region: Secondary | ICD-10-CM | POA: Diagnosis not present

## 2024-09-15 DIAGNOSIS — M9903 Segmental and somatic dysfunction of lumbar region: Secondary | ICD-10-CM | POA: Diagnosis not present

## 2024-09-15 DIAGNOSIS — M47816 Spondylosis without myelopathy or radiculopathy, lumbar region: Secondary | ICD-10-CM | POA: Diagnosis not present

## 2024-09-16 ENCOUNTER — Ambulatory Visit: Payer: Self-pay

## 2024-09-16 ENCOUNTER — Ambulatory Visit (INDEPENDENT_AMBULATORY_CARE_PROVIDER_SITE_OTHER): Admitting: Family Medicine

## 2024-09-16 ENCOUNTER — Encounter: Payer: Self-pay | Admitting: Family Medicine

## 2024-09-16 VITALS — BP 144/71 | HR 76 | Temp 98.2°F | Ht 62.0 in | Wt 141.0 lb

## 2024-09-16 DIAGNOSIS — F5101 Primary insomnia: Secondary | ICD-10-CM

## 2024-09-16 DIAGNOSIS — M79601 Pain in right arm: Secondary | ICD-10-CM | POA: Diagnosis not present

## 2024-09-16 DIAGNOSIS — M79605 Pain in left leg: Secondary | ICD-10-CM | POA: Diagnosis not present

## 2024-09-16 DIAGNOSIS — M5416 Radiculopathy, lumbar region: Secondary | ICD-10-CM

## 2024-09-16 MED ORDER — PREDNISONE 10 MG PO TABS: ORAL_TABLET | ORAL | 0 refills | Status: DC

## 2024-09-16 MED ORDER — TRAMADOL HCL 50 MG PO TABS: 50.0000 mg | ORAL_TABLET | Freq: Four times a day (QID) | ORAL | 0 refills | Status: AC

## 2024-09-16 MED ORDER — TIZANIDINE HCL 4 MG PO TABS: 4.0000 mg | ORAL_TABLET | Freq: Four times a day (QID) | ORAL | 1 refills | Status: DC | PRN

## 2024-09-16 NOTE — Telephone Encounter (Signed)
 FYI Only or Action Required?: FYI only for provider.  Patient was last seen in primary care on 04/23/2024 by Zollie Lowers, MD.  Called Nurse Triage reporting Back Pain.  Symptoms began Sunday.  Interventions attempted: Other: son gave her one of his 2 mg Tizanidine .  Symptoms are: gradually worsening.  Triage Disposition: see physician today- advised son that if pt cannot bear weight or walk to call 911 for assistance to transfer on stretcher and to go to ED. Darrell (son) agreed.   Patient/caregiver understands and will follow disposition?: yes         Copied from CRM [id number]. Topic: Clinical - Red Word Triage >> Sep 16, 2024  7:37 AM Willma R wrote: Red Word that prompted transfer to Nurse Triage: Patient is having back spasms and pain in her upper hips into her groin and her shoulders. Having difficulty walking even with her walker because of the pain. Reason for Disposition  [1] SEVERE back pain (e.g., excruciating, unable to do any normal activities) AND [2] not improved 2 hours after pain medicine  Answer Assessment - Initial Assessment Questions 1. ONSET: When did the pain begin? (e.g., minutes, hours, days)     Sunday  2. LOCATION: Where does it hurt? (upper, mid or lower back)     Lower back hips  groin comes up and goes to upper part of hip   3. SEVERITY: How bad is the pain?  (e.g., Scale 1-10; mild, moderate, or severe)     Severe- throbbing 4. PATTERN: Is the pain constant? (e.g., yes, no; constant, intermittent)      Yes pain will alternate sides of her lower back  5. RADIATION: Does the pain shoot into your legs or somewhere else?     Groin cannot bear weight on left leg  6. CAUSE:  What do you think is causing the back pain?      spasms 7. BACK OVERUSE:  Any recent lifting of heavy objects, strenuous work or exercise?    No  8. MEDICINES: What have you taken so far for the pain? (e.g., nothing, acetaminophen , NSAIDS)     Muscle relaxer  Tizanidine  2 mg Monday night  (son's med)// Tylenol  arthritis  9. NEUROLOGIC SYMPTOMS: Do you have any weakness, numbness, or problems with bowel/bladder control?     Weakness to left leg  10. OTHER SYMPTOMS: Do you have any other symptoms? (e.g., fever, abdomen pain, burning with urination, blood in urine)       no 11. PREGNANCY: Is there any chance you are pregnant? When was your last menstrual period?       N/a  Protocols used: Back Pain-A-AH

## 2024-09-16 NOTE — Progress Notes (Signed)
 Subjective:  Patient ID: Megan Chapman, female    DOB: 11-10-1931  Age: 89 y.o. MRN: [id number]  CC: Back Pain   HPI  Discussed the use of AI scribe software for clinical note transcription with the patient, who gave verbal consent to proceed.  History of Present Illness Megan Chapman is a 89 year old female who presents with severe left-sided pain and mobility issues.  She has been experiencing severe left-sided pain that began on Sunday evening, initially described as a 'catch' while sitting in a chair. By Monday morning, she was unable to walk or bear weight on her left side, from her hip to her foot, due to the severity of the pain. The pain is sharp and not associated with numbness. She visited a chiropractor on Monday, who performed soft tissue work, which provided some stabilization, but she still required a wheelchair due to the pain.  On Monday night, she took a 2 mg muscle relaxer, which provided some relief by Tuesday morning, allowing her to sit on the side of the bed. However, she still experienced significant pain when putting weight on her left leg. She returned to the chiropractor on Tuesday and was able to walk with assistance but continued to experience pain and difficulty moving.  She reports significant sleep disturbances due to the pain, stating she is unable to sleep more than two to three hours per night. She also experiences frequent urination at night, requiring her to get up multiple times, which is challenging due to her mobility issues. She did not take any pain medication on Tuesday night and reported feeling very sore and unable to move her leg on Wednesday morning.  Additionally, she reports pain in her right arm and wrist, which is exacerbated by using a walker. She describes a lump on her wrist and difficulty holding objects, leading to dropping items. The pain in her lower back is present but not as severe as the leg pain. She is concerned that her back has gotten  worse and wonders if something has happened to her spine.  She is currently not taking any regular pain medication and has not taken Tylenol  arthritis or other pain relief in the last few days. She wants pain management to improve her sleep and mobility.          09/16/2024    3:52 PM 04/23/2024    1:51 PM 11/25/2023    8:54 AM  Depression screen PHQ 2/9  Decreased Interest 0 0 0  Down, Depressed, Hopeless 0 0 0  PHQ - 2 Score 0 0 0  Altered sleeping  0   Tired, decreased energy  0   Change in appetite  1   Feeling bad or failure about yourself   0   Trouble concentrating  0   Moving slowly or fidgety/restless  1   Suicidal thoughts  0   PHQ-9 Score  2   Difficult doing work/chores  Not difficult at all     History Jahleah has a past medical history of Allergy, Anxiety, Arthritis, Asthma, Basal cell carcinoma, Cataract, GERD (gastroesophageal reflux disease), HTN (hypertension), Hyperlipidemia, Migraine, Palpitations, and Thyroid  disease.   She has a past surgical history that includes Partial knee arthroplasty (Left); Abdominal hysterectomy; Meniscus repair (Right); Excision basal cell carcinoma (2017); Cataract extraction (Bilateral); Appendectomy; Tonsillectomy; and Colonoscopy.   Her family history includes COPD in her son; Cancer in her brother; Cancer - Colon in her mother; Colon cancer in her mother; Down syndrome in  her daughter; Migraines in her brother and son; Prostate cancer in her father; Stroke in her brother.She reports that she has never smoked. She has never used smokeless tobacco. She reports that she does not drink alcohol and does not use drugs.    ROS Review of Systems  Constitutional: Negative.   HENT: Negative.    Eyes:  Negative for visual disturbance.  Respiratory:  Negative for shortness of breath.   Cardiovascular:  Negative for chest pain.  Gastrointestinal:  Negative for abdominal pain.  Musculoskeletal:  Negative for arthralgias.    Objective:   BP (!) 144/71   Pulse 76   Temp 98.2 F (36.8 C)   Ht 5' 2 (1.575 m)   Wt 141 lb (64 kg)   SpO2 95%   BMI 25.79 kg/m   BP Readings from Last 3 Encounters:  09/16/24 (!) 144/71  11/25/23 (!) 147/64  05/31/23 (!) 186/72    Wt Readings from Last 3 Encounters:  09/16/24 141 lb (64 kg)  04/30/24 148 lb (67.1 kg)  04/23/24 148 lb 6.4 oz (67.3 kg)     Physical Exam Physical Exam GENERAL: Alert, cooperative, well developed, no acute distress. HEENT: Normocephalic, normal oropharynx, moist mucous membranes. CHEST: Clear to auscultation bilaterally, no wheezes, rhonchi, or crackles. CARDIOVASCULAR: Normal heart rate and rhythm, S1 and S2 normal without murmurs. ABDOMEN: Soft, non-tender, non-distended, without organomegaly, normal bowel sounds. EXTREMITIES: No cyanosis or edema. MUSCULOSKELETAL: Lower back pain present, no tenderness on percussion of lower back. NEUROLOGICAL: Cranial nerves grossly intact, moves all extremities without gross motor or sensory deficit.    Assessment & Plan:  Lumbar radiculopathy -     MR LUMBAR SPINE WO CONTRAST; Future -     Ambulatory referral to Home Health  Primary insomnia  Left leg pain  Right arm pain  Other orders -     traMADol  HCl; Take 1 tablet (50 mg total) by mouth 4 (four) times daily for 5 days. 1-2 tablets up to 4 times a day as needed for pain  Dispense: 20 tablet; Refill: 0 -     predniSONE ; Take 5 daily for 3 days followed by 4,3,2 and 1 for 3 days each.  Dispense: 45 tablet; Refill: 0 -     tiZANidine  HCl; Take 1 tablet (4 mg total) by mouth every 6 (six) hours as needed for muscle spasms.  Dispense: 30 tablet; Refill: 1    Assessment and Plan Assessment & Plan Left lower extremity pain and functional impairment   Acute severe pain in the left lower extremity began Sunday evening, radiating from the hip to the foot and causing significant difficulty in weight-bearing and mobility. Chiropractic intervention provided  some relief, but pain persists, especially when bearing weight. Consider MRI to evaluate underlying causes and assess changes since last imaging. Discuss potential for physical therapy to aid mobility and pain management.  Right wrist and arm pain with weakness and swelling   Chronic pain in the right wrist and arm is accompanied by weakness and swelling, exacerbated by use such as pushing a walker or holding objects. A lump on the wrist and another developing on the arm contribute to functional impairment.  Chronic low back pain with possible radiculopathy   Chronic low back pain with possible radiculopathy is exacerbated by recent events. Although less severe than in the past, it contributes to overall discomfort and functional limitations.  Impaired mobility and need for assistance with activities of daily living   Significant mobility impairment due to  pain and weakness requires assistance with activities of daily living. She experiences difficulty in ambulation and performing tasks such as bathing and using the restroom. Currently uses a walker with a tray, but pain limits its effectiveness. Write a consult for home health with physical therapy to assist with mobility and daily activities.  Insomnia due to pain   Insomnia secondary to pain affects sleep quality over the past three days. Pain disrupts sleep, leading to fatigue and further impairing daily functioning. Discuss pain management options, including potential use of muscle relaxers or other analgesics to improve sleep quality.       Follow-up: No follow-ups on file.  Butler Der, M.D.

## 2024-09-18 DIAGNOSIS — Z79891 Long term (current) use of opiate analgesic: Secondary | ICD-10-CM | POA: Diagnosis not present

## 2024-09-18 DIAGNOSIS — I10 Essential (primary) hypertension: Secondary | ICD-10-CM | POA: Diagnosis not present

## 2024-09-18 DIAGNOSIS — M25531 Pain in right wrist: Secondary | ICD-10-CM | POA: Diagnosis not present

## 2024-09-18 DIAGNOSIS — M5416 Radiculopathy, lumbar region: Secondary | ICD-10-CM | POA: Diagnosis not present

## 2024-09-18 DIAGNOSIS — M79601 Pain in right arm: Secondary | ICD-10-CM | POA: Diagnosis not present

## 2024-09-18 DIAGNOSIS — Z7952 Long term (current) use of systemic steroids: Secondary | ICD-10-CM | POA: Diagnosis not present

## 2024-09-18 DIAGNOSIS — F419 Anxiety disorder, unspecified: Secondary | ICD-10-CM | POA: Diagnosis not present

## 2024-09-18 DIAGNOSIS — J45909 Unspecified asthma, uncomplicated: Secondary | ICD-10-CM | POA: Diagnosis not present

## 2024-09-18 DIAGNOSIS — G4709 Other insomnia: Secondary | ICD-10-CM | POA: Diagnosis not present

## 2024-09-22 ENCOUNTER — Encounter: Payer: Self-pay | Admitting: Family Medicine

## 2024-09-23 ENCOUNTER — Ambulatory Visit (HOSPITAL_COMMUNITY)

## 2024-09-23 DIAGNOSIS — J45909 Unspecified asthma, uncomplicated: Secondary | ICD-10-CM | POA: Diagnosis not present

## 2024-09-23 DIAGNOSIS — M5416 Radiculopathy, lumbar region: Secondary | ICD-10-CM | POA: Diagnosis not present

## 2024-09-23 DIAGNOSIS — M79601 Pain in right arm: Secondary | ICD-10-CM | POA: Diagnosis not present

## 2024-09-23 DIAGNOSIS — M25531 Pain in right wrist: Secondary | ICD-10-CM | POA: Diagnosis not present

## 2024-09-23 DIAGNOSIS — I10 Essential (primary) hypertension: Secondary | ICD-10-CM | POA: Diagnosis not present

## 2024-09-23 DIAGNOSIS — F419 Anxiety disorder, unspecified: Secondary | ICD-10-CM | POA: Diagnosis not present

## 2024-09-24 ENCOUNTER — Encounter: Payer: Self-pay | Admitting: Family Medicine

## 2024-09-24 ENCOUNTER — Ambulatory Visit: Admitting: Family Medicine

## 2024-09-24 VITALS — BP 176/76 | HR 63 | Temp 98.0°F | Ht 62.0 in | Wt 144.0 lb

## 2024-09-24 DIAGNOSIS — M5416 Radiculopathy, lumbar region: Secondary | ICD-10-CM | POA: Diagnosis not present

## 2024-09-24 DIAGNOSIS — T380X5A Adverse effect of glucocorticoids and synthetic analogues, initial encounter: Secondary | ICD-10-CM

## 2024-09-24 DIAGNOSIS — F5101 Primary insomnia: Secondary | ICD-10-CM | POA: Diagnosis not present

## 2024-09-24 NOTE — Progress Notes (Signed)
 Subjective:  Patient ID: Megan Chapman, female    DOB: 05/05/1931  Age: 89 y.o. MRN: [id number]  CC: Back Pain (One week follow up prednisone  tx)   HPI  Discussed the use of AI scribe software for clinical note transcription with the patient, who gave verbal consent to proceed.  History of Present Illness Megan Chapman is a 89 year old female who presents for follow-up regarding prednisone  treatment and associated side effects.  She is experiencing significant side effects from prednisone , including insomnia and reduced appetite. Her appetite is slightly improving, but she finds food lacks taste unless it is sour or spicy.  She is on a tapering dose of prednisone , having started on September 16, 2024. She initially took five pills for three days, then four pills for three days, and is now on three pills per day. She has taken two pills today and plans to take one more tonight.  She notes elevated blood pressure, which she attributes to not having eaten since breakfast. She feels 'starving' and has only consumed a few crackers since morning. She also reports waking up around 5:00 to 5:30 AM.  She has no pain since starting prednisone  and is receiving physical therapy, which includes using a brace. She feels anxious about the therapy but remains compliant with the regimen.  Her caregiver assists with grocery shopping and other daily activities. She plans to eat before going grocery shopping to help manage her blood pressure and energy levels. Her son Bennie, who will be home on Sunday, is expected to assist with checking her blood pressure at home.          09/24/2024    3:11 PM 09/16/2024    3:52 PM 04/23/2024    1:51 PM  Depression screen PHQ 2/9  Decreased Interest 0 0 0  Down, Depressed, Hopeless 0 0 0  PHQ - 2 Score 0 0 0  Altered sleeping   0  Tired, decreased energy   0  Change in appetite   1  Feeling bad or failure about yourself    0  Trouble concentrating   0  Moving  slowly or fidgety/restless   1  Suicidal thoughts   0  PHQ-9 Score   2  Difficult doing work/chores   Not difficult at all    History Maleah has a past medical history of Allergy, Anxiety, Arthritis, Asthma, Basal cell carcinoma, Cataract, GERD (gastroesophageal reflux disease), HTN (hypertension), Hyperlipidemia, Migraine, Palpitations, and Thyroid  disease.   She has a past surgical history that includes Partial knee arthroplasty (Left); Abdominal hysterectomy; Meniscus repair (Right); Excision basal cell carcinoma (2017); Cataract extraction (Bilateral); Appendectomy; Tonsillectomy; and Colonoscopy.   Her family history includes COPD in her son; Cancer in her brother; Cancer - Colon in her mother; Colon cancer in her mother; Down syndrome in her daughter; Migraines in her brother and son; Prostate cancer in her father; Stroke in her brother.She reports that she has never smoked. She has never used smokeless tobacco. She reports that she does not drink alcohol and does not use drugs.    ROS Review of Systems  Constitutional: Negative.   HENT: Negative.    Eyes:  Negative for visual disturbance.  Respiratory:  Negative for shortness of breath.   Cardiovascular:  Negative for chest pain.  Gastrointestinal:  Negative for abdominal pain.  Musculoskeletal:  Negative for arthralgias.    Objective:  BP (!) 176/76   Pulse 63   Temp 98 F (36.7 C)   Ht  5' 2 (1.575 m)   Wt 144 lb (65.3 kg)   SpO2 97%   BMI 26.34 kg/m   BP Readings from Last 3 Encounters:  09/24/24 (!) 176/76  09/16/24 (!) 144/71  11/25/23 (!) 147/64    Wt Readings from Last 3 Encounters:  09/24/24 144 lb (65.3 kg)  09/16/24 141 lb (64 kg)  04/30/24 148 lb (67.1 kg)     Physical Exam Physical Exam GENERAL: Alert, cooperative, well developed, no acute distress. HEENT: Normocephalic, normal oropharynx, moist mucous membranes. CHEST: Clear to auscultation bilaterally, no wheezes, rhonchi, or  crackles. CARDIOVASCULAR: Normal heart rate and rhythm, S1 and S2 normal without murmurs. ABDOMEN: Soft, non-tender, non-distended, without organomegaly, normal bowel sounds. EXTREMITIES: No cyanosis or edema. NEUROLOGICAL: Cranial nerves grossly intact, moves all extremities without gross motor or sensory deficit.   Assessment & Plan:  Lumbar radiculopathy  Primary insomnia  Adverse effect of prednisone , initial encounter    Assessment and Plan Assessment & Plan Chronic low back pain with lumbar radiculopathy and left leg pain   Her condition has significantly improved with the current treatment regimen. She reports no pain since starting prednisone  therapy. Physical therapy continues to be beneficial. Continue the current physical therapy regimen and complete the prednisone  taper: 2 tablets today, 2 tablets tomorrow, 1 tablet on Saturday, and 1 tablet on Sunday, then stop.  Impaired mobility and need for assistance with activities of daily living   Mobility remains impaired, requiring assistance. She uses a walker and receives support from family members. Encourage the use of a walker for mobility support and continue physical therapy to improve mobility.  Hypertension   Blood pressure is elevated, possibly due to lack of food intake and prednisone  use. Home monitoring is not feasible for her. Her son will monitor blood pressure at home and report readings. Consider medication adjustment if blood pressure remains elevated.  Primary insomnia   Insomnia persists, potentially exacerbated by prednisone  use, with sleep disturbances noted as early morning awakenings.  Decreased appetite and altered taste due to prednisone    Decreased appetite and altered taste are likely secondary to prednisone , though some improvement in appetite is reported. Foods with bold flavors are more appealing. Encourage consumption of foods with bold flavors to enhance taste and monitor appetite and taste changes  as prednisone  is tapered.       Follow-up: No follow-ups on file.  Butler Der, M.D.

## 2024-09-24 NOTE — Patient Instructions (Signed)
 Take no more prednisone  today. Take 2 tomorrow. On the next day (Saturday) take one prednisone . The day after that (Sunday) take  one Prednisone  only. Stop taking the medication after Sunday.

## 2024-09-25 ENCOUNTER — Encounter: Payer: Self-pay | Admitting: Family Medicine

## 2024-09-25 DIAGNOSIS — M79601 Pain in right arm: Secondary | ICD-10-CM | POA: Diagnosis not present

## 2024-09-25 DIAGNOSIS — F419 Anxiety disorder, unspecified: Secondary | ICD-10-CM | POA: Diagnosis not present

## 2024-09-25 DIAGNOSIS — J45909 Unspecified asthma, uncomplicated: Secondary | ICD-10-CM | POA: Diagnosis not present

## 2024-09-25 DIAGNOSIS — M5416 Radiculopathy, lumbar region: Secondary | ICD-10-CM | POA: Diagnosis not present

## 2024-09-25 DIAGNOSIS — M25531 Pain in right wrist: Secondary | ICD-10-CM | POA: Diagnosis not present

## 2024-09-25 DIAGNOSIS — I10 Essential (primary) hypertension: Secondary | ICD-10-CM | POA: Diagnosis not present

## 2024-09-28 ENCOUNTER — Ambulatory Visit (HOSPITAL_COMMUNITY)
Admission: RE | Admit: 2024-09-28 | Discharge: 2024-09-28 | Disposition: A | Discharge status: Home / Self Care | Source: Ambulatory Visit | Attending: Family Medicine | Admitting: Family Medicine

## 2024-09-28 DIAGNOSIS — I10 Essential (primary) hypertension: Secondary | ICD-10-CM | POA: Diagnosis not present

## 2024-09-28 DIAGNOSIS — F419 Anxiety disorder, unspecified: Secondary | ICD-10-CM | POA: Diagnosis not present

## 2024-09-28 DIAGNOSIS — M79601 Pain in right arm: Secondary | ICD-10-CM | POA: Diagnosis not present

## 2024-09-28 DIAGNOSIS — M47817 Spondylosis without myelopathy or radiculopathy, lumbosacral region: Secondary | ICD-10-CM | POA: Diagnosis not present

## 2024-09-28 DIAGNOSIS — M4150 Other secondary scoliosis, site unspecified: Secondary | ICD-10-CM | POA: Diagnosis not present

## 2024-09-28 DIAGNOSIS — M25531 Pain in right wrist: Secondary | ICD-10-CM | POA: Diagnosis not present

## 2024-09-28 DIAGNOSIS — M48061 Spinal stenosis, lumbar region without neurogenic claudication: Secondary | ICD-10-CM | POA: Diagnosis not present

## 2024-09-28 DIAGNOSIS — M5416 Radiculopathy, lumbar region: Secondary | ICD-10-CM | POA: Insufficient documentation

## 2024-09-28 DIAGNOSIS — M47816 Spondylosis without myelopathy or radiculopathy, lumbar region: Secondary | ICD-10-CM

## 2024-09-28 DIAGNOSIS — J45909 Unspecified asthma, uncomplicated: Secondary | ICD-10-CM | POA: Diagnosis not present

## 2024-09-30 ENCOUNTER — Ambulatory Visit

## 2024-09-30 DIAGNOSIS — M5416 Radiculopathy, lumbar region: Secondary | ICD-10-CM

## 2024-09-30 DIAGNOSIS — M79601 Pain in right arm: Secondary | ICD-10-CM | POA: Diagnosis not present

## 2024-09-30 DIAGNOSIS — J45909 Unspecified asthma, uncomplicated: Secondary | ICD-10-CM

## 2024-09-30 DIAGNOSIS — I10 Essential (primary) hypertension: Secondary | ICD-10-CM

## 2024-09-30 DIAGNOSIS — G4709 Other insomnia: Secondary | ICD-10-CM

## 2024-09-30 DIAGNOSIS — Z79891 Long term (current) use of opiate analgesic: Secondary | ICD-10-CM | POA: Diagnosis not present

## 2024-09-30 DIAGNOSIS — F419 Anxiety disorder, unspecified: Secondary | ICD-10-CM

## 2024-09-30 DIAGNOSIS — Z7952 Long term (current) use of systemic steroids: Secondary | ICD-10-CM | POA: Diagnosis not present

## 2024-09-30 DIAGNOSIS — M25531 Pain in right wrist: Secondary | ICD-10-CM | POA: Diagnosis not present

## 2024-10-01 DIAGNOSIS — M79601 Pain in right arm: Secondary | ICD-10-CM | POA: Diagnosis not present

## 2024-10-01 DIAGNOSIS — M5416 Radiculopathy, lumbar region: Secondary | ICD-10-CM | POA: Diagnosis not present

## 2024-10-01 DIAGNOSIS — J45909 Unspecified asthma, uncomplicated: Secondary | ICD-10-CM | POA: Diagnosis not present

## 2024-10-01 DIAGNOSIS — I10 Essential (primary) hypertension: Secondary | ICD-10-CM | POA: Diagnosis not present

## 2024-10-01 DIAGNOSIS — F419 Anxiety disorder, unspecified: Secondary | ICD-10-CM | POA: Diagnosis not present

## 2024-10-01 DIAGNOSIS — M25531 Pain in right wrist: Secondary | ICD-10-CM | POA: Diagnosis not present

## 2024-10-06 ENCOUNTER — Ambulatory Visit: Payer: Self-pay | Admitting: Family Medicine

## 2024-10-22 ENCOUNTER — Ambulatory Visit: Admitting: Family Medicine

## 2024-10-23 DIAGNOSIS — B351 Tinea unguium: Secondary | ICD-10-CM | POA: Diagnosis not present

## 2024-10-23 DIAGNOSIS — I739 Peripheral vascular disease, unspecified: Secondary | ICD-10-CM | POA: Diagnosis not present

## 2024-10-29 ENCOUNTER — Encounter: Payer: Self-pay | Admitting: Family Medicine

## 2024-11-03 ENCOUNTER — Ambulatory Visit: Payer: Self-pay | Admitting: Family Medicine

## 2024-11-03 ENCOUNTER — Encounter: Payer: Self-pay | Admitting: Family Medicine

## 2024-11-03 VITALS — BP 132/71 | HR 83 | Temp 96.4°F | Ht 62.0 in | Wt 143.0 lb

## 2024-11-03 DIAGNOSIS — J441 Chronic obstructive pulmonary disease with (acute) exacerbation: Secondary | ICD-10-CM

## 2024-11-03 DIAGNOSIS — E782 Mixed hyperlipidemia: Secondary | ICD-10-CM

## 2024-11-03 DIAGNOSIS — M1812 Unilateral primary osteoarthritis of first carpometacarpal joint, left hand: Secondary | ICD-10-CM | POA: Diagnosis not present

## 2024-11-03 DIAGNOSIS — M545 Low back pain, unspecified: Secondary | ICD-10-CM | POA: Diagnosis not present

## 2024-11-03 DIAGNOSIS — G8929 Other chronic pain: Secondary | ICD-10-CM | POA: Diagnosis not present

## 2024-11-03 DIAGNOSIS — Z23 Encounter for immunization: Secondary | ICD-10-CM

## 2024-11-03 DIAGNOSIS — I10 Essential (primary) hypertension: Secondary | ICD-10-CM | POA: Diagnosis not present

## 2024-11-03 DIAGNOSIS — E038 Other specified hypothyroidism: Secondary | ICD-10-CM | POA: Diagnosis not present

## 2024-11-03 MED ORDER — ATORVASTATIN CALCIUM 40 MG PO TABS: 40.0000 mg | ORAL_TABLET | Freq: Every day | ORAL | 3 refills | Status: AC

## 2024-11-03 MED ORDER — LEVOTHYROXINE SODIUM 25 MCG PO TABS: 25.0000 ug | ORAL_TABLET | Freq: Every day | ORAL | 2 refills | Status: AC

## 2024-11-03 NOTE — Progress Notes (Signed)
 Subjective:  Patient ID: Megan Chapman, female    DOB: [birth year]  Age: 89 y.o. MRN: [id number]  CC: Medication Refill (Pended)   HPI  Discussed the use of AI scribe software for clinical note transcription with the patient, who gave verbal consent to proceed.  History of Present Illness Megan Chapman is a 89 year old female who presents for a routine physical exam.  She experiences ongoing arthritis pain, particularly in her right wrist near the thumb. She uses Tylenol  Arthritis 500 mg, taking two pills in the morning, afternoon, and night, although she does not always take the full dose. She wears a thumb brace when working or driving to alleviate the pain.  She has a history of thyroid  issues and reports low energy, dry skin, and constipation. Her energy levels are affected by her back pain, which limits her ability to perform heavy-duty cleaning. She experiences dry skin, particularly around her ankles, and occasional constipation if her diet is not managed properly.  She has a history of COPD but has not needed to use her inhalers recently. She has an Advair inhaler (salmeterol and fluticasone  100/50) but only experiences shortness of breath when walking quickly in cold weather.  She has a history of high blood pressure, which was previously elevated at 176/76 in September, but has improved to 132/71. She monitors her blood pressure at home and attributes fluctuations to stress.  She mentions having a hiatal hernia, which causes discomfort if she eats too much, especially at supper. She describes her diet as unrestricted, enjoying a variety of foods including pork and biscuits.  She has not experienced any recent respiratory infections or colds. No diarrhea and occasional swelling in her right ankle by the end of the day.          09/24/2024    3:11 PM 09/16/2024    3:52 PM 04/23/2024    1:51 PM  Depression screen PHQ 2/9  Decreased Interest 0 0 0  Down, Depressed, Hopeless 0 0 0   PHQ - 2 Score 0 0 0  Altered sleeping   0  Tired, decreased energy   0  Change in appetite   1  Feeling bad or failure about yourself    0  Trouble concentrating   0  Moving slowly or fidgety/restless   1  Suicidal thoughts   0  PHQ-9 Score   2  Difficult doing work/chores   Not difficult at all    History Tarhonda has a past medical history of Allergy, Anxiety, Arthritis, Asthma, Basal cell carcinoma, Cataract, GERD (gastroesophageal reflux disease), HTN (hypertension), Hyperlipidemia, Migraine, Palpitations, and Thyroid  disease.   She has a past surgical history that includes Partial knee arthroplasty (Left); Abdominal hysterectomy; Meniscus repair (Right); Excision basal cell carcinoma (2017); Cataract extraction (Bilateral); Appendectomy; Tonsillectomy; and Colonoscopy.   Her family history includes COPD in her son; Cancer in her brother; Cancer - Colon in her mother; Colon cancer in her mother; Down syndrome in her daughter; Migraines in her brother and son; Prostate cancer in her father; Stroke in her brother.She reports that she has never smoked. She has never used smokeless tobacco. She reports that she does not drink alcohol and does not use drugs.    ROS Review of Systems  Constitutional: Negative.   HENT: Negative.  Negative for congestion.   Eyes:  Negative for visual disturbance.  Respiratory:  Negative for shortness of breath.   Cardiovascular:  Negative for chest pain.  Gastrointestinal:  Negative for  abdominal pain, constipation, diarrhea, nausea and vomiting.  Genitourinary:  Negative for difficulty urinating.  Musculoskeletal:  Negative for arthralgias and myalgias.  Neurological:  Negative for headaches.  Psychiatric/Behavioral:  Negative for sleep disturbance.     Objective:  BP 132/71   Pulse 83   Temp (!) 96.4 F (35.8 C)   Ht 5' 2 (1.575 m)   Wt 143 lb (64.9 kg)   SpO2 98%   BMI 26.16 kg/m   BP Readings from Last 3 Encounters:  11/03/24 132/71   09/24/24 (!) 176/76  09/16/24 (!) 144/71    Wt Readings from Last 3 Encounters:  11/03/24 143 lb (64.9 kg)  09/24/24 144 lb (65.3 kg)  09/16/24 141 lb (64 kg)     Physical Exam Constitutional:      General: She is not in acute distress.    Appearance: She is well-developed.  Cardiovascular:     Rate and Rhythm: Normal rate and regular rhythm.  Pulmonary:     Breath sounds: Normal breath sounds.  Abdominal:     Palpations: Abdomen is soft. There is no mass.     Tenderness: There is no abdominal tenderness.  Musculoskeletal:        General: Normal range of motion.  Skin:    General: Skin is warm and dry.  Neurological:     Mental Status: She is alert and oriented to person, place, and time.    Physical Exam VITALS: BP- 132/71 GENERAL: Alert, cooperative, well developed, no acute distress. HEENT: Normocephalic, normal oropharynx, moist mucous membranes. NECK: Thyroid  normal, no swelling. CHEST: Clear to auscultation bilaterally, no wheezes, rhonchi, or crackles. CARDIOVASCULAR: Normal heart rate and rhythm, S1 and S2 normal without murmurs. ABDOMEN: Soft, non-tender, non-distended, without organomegaly, normal bowel sounds. EXTREMITIES: No cyanosis or edema. NEUROLOGICAL: Cranial nerves grossly intact, moves all extremities without gross motor or sensory deficit. SKIN: Dry skin around right ankle.   Assessment & Plan:  Other specified hypothyroidism -     CBC with Differential/Platelet -     TSH + free T4  Encounter for immunization -     Flu vaccine HIGH DOSE PF(Fluzone Trivalent)  Mixed hyperlipidemia -     CBC with Differential/Platelet -     CMP14+EGFR -     Lipid panel  Essential hypertension -     CMP14+EGFR  Arthritis of carpometacarpal (CMC) joint of left thumb  Chronic bilateral low back pain without sciatica  Other orders -     Levothyroxine  Sodium; Take 1 tablet (25 mcg total) by mouth daily before breakfast.  Dispense: 90 tablet; Refill: 2 -      Atorvastatin  Calcium ; Take 1 tablet (40 mg total) by mouth daily.  Dispense: 90 tablet; Refill: 3    Assessment and Plan Assessment & Plan Primary osteoarthritis of the right wrist   Chronic pain at the base of the thumb is managed with Tylenol  Arthritis 500 mg and a thumb brace during activities that worsen the pain. Continue Tylenol  Arthritis 500 mg as needed and use the thumb brace during exacerbating activities.  Lumbar radiculopathy   Chronic back pain affects energy levels and limits heavy-duty cleaning and physical activity. Avoid strenuous activities that may worsen back pain.  Chronic obstructive pulmonary disease (COPD)   COPD is well-managed with no recent inhaler use and no significant breathing issues unless walking quickly in cold weather. Continue current management and use inhalers as needed. Monitor for any increase in symptoms, especially during winter.  Other specified hypothyroidism  Symptoms include dry skin, constipation, and low energy levels. Thyroid  function has not been checked in over six months. Order thyroid  function tests to assess current thyroid  status.  Essential hypertension   Blood pressure is well-controlled at 132/71 mmHg, improved from previous readings. Stress and pain may have contributed to previous elevated readings. Continue current hypertension management and monitor blood pressure regularly.  Constipation   Occasional constipation is related to dietary habits with no recent episodes of diarrhea.       Follow-up: Return in about 6 months (around 05/03/2025).  Butler Der, M.D.

## 2024-11-04 ENCOUNTER — Ambulatory Visit: Payer: Self-pay | Admitting: Family Medicine

## 2024-11-04 LAB — CBC WITH DIFFERENTIAL/PLATELET
Basophils Absolute: 0 x10E3/uL (ref 0.0–0.2)
Basos: 0 %
EOS (ABSOLUTE): 0.3 x10E3/uL (ref 0.0–0.4)
Eos: 7 %
Hematocrit: 37 % (ref 34.0–46.6)
Hemoglobin: 11.8 g/dL (ref 11.1–15.9)
Immature Grans (Abs): 0 x10E3/uL (ref 0.0–0.1)
Immature Granulocytes: 0 %
Lymphocytes Absolute: 1.2 x10E3/uL (ref 0.7–3.1)
Lymphs: 25 %
MCH: 29.1 pg (ref 26.6–33.0)
MCHC: 31.9 g/dL (ref 31.5–35.7)
MCV: 91 fL (ref 79–97)
Monocytes Absolute: 0.9 x10E3/uL (ref 0.1–0.9)
Monocytes: 18 %
Neutrophils Absolute: 2.4 x10E3/uL (ref 1.4–7.0)
Neutrophils: 50 %
Platelets: 182 x10E3/uL (ref 150–450)
RBC: 4.06 x10E6/uL (ref 3.77–5.28)
RDW: 14 % (ref 11.7–15.4)
WBC: 4.8 x10E3/uL (ref 3.4–10.8)

## 2024-11-04 LAB — LIPID PANEL
Chol/HDL Ratio: 3.6 ratio (ref 0.0–4.4)
Cholesterol, Total: 180 mg/dL (ref 100–199)
HDL: 50 mg/dL (ref >39)
LDL Chol Calc (NIH): 97 mg/dL (ref 0–99)
Triglycerides: 195 mg/dL — ABNORMAL HIGH (ref 0–149)
VLDL Cholesterol Cal: 33 mg/dL (ref 5–40)

## 2024-11-04 LAB — TSH+FREE T4
Free T4: 1.08 ng/dL (ref 0.82–1.77)
TSH: 2.1 u[IU]/mL (ref 0.450–4.500)

## 2024-11-04 LAB — CMP14+EGFR
ALT: 9 IU/L (ref 0–32)
AST: 16 IU/L (ref 0–40)
Albumin: 4.2 g/dL (ref 3.6–4.6)
Alkaline Phosphatase: 68 IU/L (ref 48–129)
BUN/Creatinine Ratio: 23 (ref 12–28)
BUN: 18 mg/dL (ref 10–36)
Bilirubin Total: 0.6 mg/dL (ref 0.0–1.2)
CO2: 23 mmol/L (ref 20–29)
Calcium: 9.6 mg/dL (ref 8.7–10.3)
Chloride: 102 mmol/L (ref 96–106)
Creatinine, Ser: 0.8 mg/dL (ref 0.57–1.00)
Globulin, Total: 2.8 g/dL (ref 1.5–4.5)
Glucose: 120 mg/dL — ABNORMAL HIGH (ref 70–99)
Potassium: 4.1 mmol/L (ref 3.5–5.2)
Sodium: 140 mmol/L (ref 134–144)
Total Protein: 7 g/dL (ref 6.0–8.5)
eGFR: 70 mL/min/1.73 (ref >59)

## 2024-11-04 NOTE — Progress Notes (Signed)
Hello  Ludivina,    Your lab result is normal and/or stable.Some minor variations that are not significant are commonly marked abnormal, but do not represent any medical problem for you.   Best regards,  Benny Deutschman, M.D.

## 2024-11-12 DIAGNOSIS — L820 Inflamed seborrheic keratosis: Secondary | ICD-10-CM | POA: Diagnosis not present

## 2024-11-20 DIAGNOSIS — H527 Unspecified disorder of refraction: Secondary | ICD-10-CM | POA: Diagnosis not present

## 2024-11-20 DIAGNOSIS — H43813 Vitreous degeneration, bilateral: Secondary | ICD-10-CM | POA: Diagnosis not present

## 2024-11-20 DIAGNOSIS — H35373 Puckering of macula, bilateral: Secondary | ICD-10-CM | POA: Diagnosis not present

## 2024-11-20 DIAGNOSIS — H26491 Other secondary cataract, right eye: Secondary | ICD-10-CM | POA: Diagnosis not present

## 2024-11-20 DIAGNOSIS — H353122 Nonexudative age-related macular degeneration, left eye, intermediate dry stage: Secondary | ICD-10-CM | POA: Diagnosis not present

## 2024-12-11 DIAGNOSIS — M47816 Spondylosis without myelopathy or radiculopathy, lumbar region: Secondary | ICD-10-CM | POA: Diagnosis not present

## 2024-12-11 DIAGNOSIS — M9903 Segmental and somatic dysfunction of lumbar region: Secondary | ICD-10-CM | POA: Diagnosis not present

## 2024-12-11 DIAGNOSIS — M5126 Other intervertebral disc displacement, lumbar region: Secondary | ICD-10-CM | POA: Diagnosis not present

## 2024-12-14 DIAGNOSIS — M47816 Spondylosis without myelopathy or radiculopathy, lumbar region: Secondary | ICD-10-CM | POA: Diagnosis not present

## 2024-12-14 DIAGNOSIS — M5126 Other intervertebral disc displacement, lumbar region: Secondary | ICD-10-CM | POA: Diagnosis not present

## 2024-12-14 DIAGNOSIS — M9903 Segmental and somatic dysfunction of lumbar region: Secondary | ICD-10-CM | POA: Diagnosis not present

## 2024-12-24 ENCOUNTER — Encounter: Payer: Self-pay | Admitting: Family Medicine

## 2024-12-25 MED ORDER — PREDNISONE 20 MG PO TABS: 40.0000 mg | ORAL_TABLET | Freq: Every day | ORAL | 0 refills | Status: AC

## 2024-12-25 NOTE — Telephone Encounter (Signed)
 Prednisone  sent to [facility] ordered this encounter  Medications   predniSONE  (DELTASONE ) 20 MG tablet    Sig: Take 2 tablets (40 mg total) by mouth daily with breakfast for 5 days. 2 po daily for 5 days    Dispense:  10 tablet    Refill:  0    Supervising Provider:   MARYANNE CHEW A [[id number]]   Mary-Margaret Gladis, FNP

## 2025-01-11 ENCOUNTER — Encounter: Payer: Self-pay | Admitting: Family Medicine

## 2025-01-20 ENCOUNTER — Encounter: Payer: Self-pay | Admitting: Family Medicine

## 2025-01-20 ENCOUNTER — Ambulatory Visit: Admitting: Family Medicine

## 2025-01-20 ENCOUNTER — Ambulatory Visit: Payer: Self-pay

## 2025-01-20 VITALS — BP 134/62 | HR 88 | Ht 62.0 in | Wt 136.0 lb

## 2025-01-20 DIAGNOSIS — S161XXA Strain of muscle, fascia and tendon at neck level, initial encounter: Secondary | ICD-10-CM

## 2025-01-20 MED ORDER — BACLOFEN 10 MG PO TABS: 10.0000 mg | ORAL_TABLET | Freq: Three times a day (TID) | ORAL | 0 refills | Status: AC | PRN

## 2025-01-20 MED ORDER — PREDNISONE 20 MG PO TABS: ORAL_TABLET | ORAL | 0 refills | Status: AC

## 2025-01-20 NOTE — Telephone Encounter (Signed)
 Apt scheduled.

## 2025-01-20 NOTE — Progress Notes (Signed)
 "  BP 134/62   Pulse 88   Ht 5' 2 (1.575 m)   Wt 136 lb (61.7 kg)   SpO2 98%   BMI 24.87 kg/m    Subjective:   Patient ID: Megan Chapman, female    DOB: 04-15-1931, 89 y.o.   MRN: [id number]  HPI: Megan Chapman is a 89 y.o. female presenting on 01/20/2025 for Neck Pain (Radiates to right shoulder. Pain does not radiate down arm. Pain present for 22m on/off.)   Discussed the use of AI scribe software for clinical note transcription with the patient, who gave verbal consent to proceed.  History of Present Illness   Megan Chapman is a 89 year old female with arthritis who presents with neck pain.  Neck pain - Intermittent neck pain, began as a 'crick' and intensified yesterday evening - Difficulty turning neck due to pain - Pain severity varies day to day; some days more painful than others - Significant soreness about one month ago - Pain occasionally severe enough to cause crying - Sleeps mostly on her side; considering changing pillow for better support - Does not like using TENS unit for neck pain  Arthritis - Arthritis affecting neck, back, and feet with episodic flares - MRI in 2021 demonstrated significant arthritis in the cervical spine  Analgesic use - Uses Tylenol  Arthritis, typically one tablet in the morning - Tylenol  sometimes provides relief if she takes a couple of tablets - Usually takes Tylenol  for back pain  Corticosteroid use - Received two courses of prednisone  in the past month: one for back pain, one for foot pain - Last prednisone  course ended approximately three weeks ago - Prednisone  provides significant relief, with improvement noted the day after starting  Muscle relaxant use - Trial of tizanidine  resulted in adverse effects and was not tolerated          Relevant past medical, surgical, family and social history reviewed and updated as indicated. Interim medical history since our last visit reviewed. Allergies and medications reviewed and  updated.  Review of Systems  Constitutional:  Negative for chills and fever.  Eyes:  Negative for visual disturbance.  Respiratory:  Negative for chest tightness and shortness of breath.   Cardiovascular:  Negative for chest pain and leg swelling.  Musculoskeletal:  Positive for arthralgias, myalgias and neck pain. Negative for back pain and gait problem.  Skin:  Negative for rash.  Neurological:  Negative for light-headedness and headaches.  Psychiatric/Behavioral:  Negative for agitation and behavioral problems.   All other systems reviewed and are negative.   Per HPI unless specifically indicated above   Allergies as of 01/20/2025       Reactions   Amoxicillin    Biaxin [clarithromycin]    Ciprofloxacin  Nausea Only   Codeine    Nitrofurantoin Nausea And Vomiting, Nausea Only   Sulfa Antibiotics         Medication List        Accurate as of January 20, 2025  1:59 PM. If you have any questions, ask your nurse or doctor.          acetaminophen  500 MG tablet Commonly known as: TYLENOL  Take 1 tablet (500 mg total) by mouth every 6 (six) hours as needed.   albuterol  108 (90 Base) MCG/ACT inhaler Commonly known as: VENTOLIN  HFA INHALE 1-2 PUFFS BY MOUTH EVERY 6 HOURS AS NEEDED FOR WHEEZE OR SHORTNESS OF BREATH   atorvastatin  40 MG tablet Commonly known as: LIPITOR Take 1 tablet (40  mg total) by mouth daily.   baclofen  10 MG tablet Commonly known as: LIORESAL  Take 1 tablet (10 mg total) by mouth 3 (three) times daily as needed for muscle spasms. Started by: Fonda Levins, MD   Fish Oil 1200 MG Caps Take 2 capsules by mouth daily.   fluticasone -salmeterol 100-50 MCG/ACT Aepb Commonly known as: Advair Diskus Inhale 1 puff into the lungs 2 (two) times daily.   levothyroxine  25 MCG tablet Commonly known as: SYNTHROID  Take 1 tablet (25 mcg total) by mouth daily before breakfast.   predniSONE  20 MG tablet Commonly known as: DELTASONE  2 po at same time  daily for 5 days Started by: Fonda Levins, MD   Vitamin D3 25 MCG (1000 UT) Caps Take 2 capsules (2,000 Units total) by mouth daily.         Objective:   BP 134/62   Pulse 88   Ht 5' 2 (1.575 m)   Wt 136 lb (61.7 kg)   SpO2 98%   BMI 24.87 kg/m   Wt Readings from Last 3 Encounters:  01/20/25 136 lb (61.7 kg)  11/03/24 143 lb (64.9 kg)  09/24/24 144 lb (65.3 kg)    Physical Exam Vitals and nursing note reviewed.    Physical Exam   NECK: Tenderness on palpation, more pronounced on the right side.         Assessment & Plan:   Problem List Items Addressed This Visit   None Visit Diagnoses       Strain of neck muscle, initial encounter    -  Primary   Relevant Medications   predniSONE  (DELTASONE ) 20 MG tablet   baclofen  (LIORESAL ) 10 MG tablet          Neck Pain Acute right-sided neck pain likely muscular due to tightness and spasms, with arthritis as a possible factor. Recent prednisone  use showed good response. Discussed long-term steroid risks. - Prescribe short course of prednisone  for 4-5 days. Advise stopping if symptoms improve in 1-2 days and save remaining doses. - Prescribe baclofen  as a muscle relaxant, starting with a low dose. - Advise heat therapy and suggest alternating with ice. - Recommend TENS unit for pain relief. - Encourage stretching and gentle neck movement. - Suggest memory foam pillow for side sleeping.  Arthritis Arthritis with intermittent flare-ups. Current neck pain episode likely muscular. Discussed low-dose long-term prednisone  use with her other healthcare provider, considering risks. - Discuss low-dose long-term prednisone  use with her other healthcare provider, considering risks.          Follow up plan: Return if symptoms worsen or fail to improve.  Counseling provided for all of the vaccine components No orders of the defined types were placed in this encounter.   Fonda Levins, MD [facility]  Family Medicine 01/20/2025, 1:59 PM     "

## 2025-01-20 NOTE — Telephone Encounter (Signed)
 FYI Only or Action Required?: FYI only for provider: appointment scheduled on 01/20/25.  Patient was last seen in primary care on 11/03/2024 by Zollie Lowers, MD.  Called Nurse Triage reporting Neck Pain.  Symptoms began about a month ago.  Interventions attempted: OTC medications: tylenol  arthritis.  Symptoms are: gradually worsening.  Triage Disposition: See HCP Within 4 Hours (Or PCP Triage)  Patient/caregiver understands and will follow disposition?:   Reason for Disposition  [1] SEVERE neck pain (e.g., excruciating, unable to do any normal activities) AND [2] not improved after 2 hours of pain medicine  Answer Assessment - Initial Assessment Questions Pt's son reports pt is complaining of neck soreness that occ effects ROM. Believes d/t arthritis. Severe today. Advised ED if worsens before OV today or if new symptoms develop. Son verbalized understanding.  1. ONSET: When did the pain begin?      Approx 1 month 2. LOCATION: Where does it hurt?      Neck 3. PATTERN Does the pain come and go, or has it been constant since it started?      Comes and goes 4. SEVERITY: How bad is the pain?  (Scale 0-10; or none or slight stiffness, mild, moderate, severe)     Severe, pt not on phone with this RN to specify, son states varies 5. RADIATION: Does the pain go anywhere else, shoot into your arms?     Denies 6. CORD SYMPTOMS: Any weakness or numbness of the arms or legs?     Denies 7. CAUSE: What do you think is causing the neck pain?     Believes d/t arthritis 9. OTHER SYMPTOMS: Do you have any other symptoms? (e.g., headache, fever, chest pain, difficulty breathing, neck swelling)     Denies  Protocols used: Neck Pain or Stiffness-A-AH   Message from [place] W sent at 01/20/2025 10:33 AM EST  Reason for Triage: Patient's son, Harman, calling in stating that his mom is having some issues with her neck. States she can't move her neck without being in pain. States  it's worse than a crick in the neck. States he pretty sure it's arthritis.

## 2025-01-25 NOTE — Telephone Encounter (Signed)
 Celebrex is similar to naproxen, meloxicam  and all arthritis pills. Last year when you took celebrex you had a large decline in kidney function that improved once the medication was stopped. That was at a dose that is low. Naproxen and meloxicam  are more likely to cause a problem than the celebrex was. Because of that I do not recommend you take these, or any other NSAID medication due to the risk to your kidneys.

## 2025-01-26 ENCOUNTER — Ambulatory Visit: Payer: Self-pay

## 2025-01-26 NOTE — Telephone Encounter (Signed)
 Ed advised

## 2025-01-26 NOTE — Telephone Encounter (Signed)
 FYI Only or Action Required?: FYI only for provider: ED advised.  Patient was last seen in primary care on 01/20/2025 by Dettinger, Fonda LABOR, MD.  Called Nurse Triage reporting Fever, Fatigue, Chest Pain, and Urinary Tract Infection.  Symptoms began today.  Interventions attempted: Prescription medications: Cephalexin.  Symptoms are: gradually worsening.  Triage Disposition: Go to ED Now (or PCP Triage)  Patient/caregiver understands and will follow disposition?: Yes                                  This RN spoke to [employer], patient's son (on [place]). Patient was evaluated in the ED on Saturday and diagnosed with a UTI, per son. Patient was prescribed Cephalexin and took her 6th dose today, per son. This morning, patient experienced new onset of fever (100.2 and 100.4), weakness elevated from baseline and soreness in her chest. Son denied: cough, congestion and difficulty breathing. This RN advised ED. Son verbalized understanding and agreed to take patient.   Reason for Disposition  Patient sounds very sick or weak to the triager  Protocols used: Urinary Tract Infection on Antibiotic Follow-up Call - [facility]  Summary: weak   Reason for Triage: Todd said patient went to ER on Saturday has UTI she was given antibiotic Cephalexin and today she is not feeling well. She is weak, fever and sore in her upper chest area.

## 2025-02-05 ENCOUNTER — Inpatient Hospital Stay: Admitting: Family Medicine

## 2025-02-09 ENCOUNTER — Ambulatory Visit

## 2025-05-03 ENCOUNTER — Ambulatory Visit: Admitting: Family Medicine
